# Patient Record
Sex: Female | Born: 1970 | Race: Black or African American | Hispanic: No | Marital: Single | Smoking: Never smoker
Health system: Southern US, Community
[De-identification: ages and names within clinical notes are randomized; demographics above are authoritative.]

## PROBLEM LIST (undated history)

## (undated) DIAGNOSIS — I509 Heart failure, unspecified: Secondary | ICD-10-CM

## (undated) DIAGNOSIS — I10 Essential (primary) hypertension: Secondary | ICD-10-CM

## (undated) HISTORY — PX: CARPAL TUNNEL RELEASE: SHX101

---

## 1999-06-26 ENCOUNTER — Emergency Department (HOSPITAL_COMMUNITY): Admission: EM | Admit: 1999-06-26 | Discharge: 1999-06-27 | Payer: Self-pay | Admitting: Emergency Medicine

## 2000-09-13 ENCOUNTER — Ambulatory Visit (HOSPITAL_BASED_OUTPATIENT_CLINIC_OR_DEPARTMENT_OTHER): Admission: RE | Admit: 2000-09-13 | Discharge: 2000-09-13 | Payer: Self-pay | Admitting: Orthopedic Surgery

## 2001-01-26 ENCOUNTER — Encounter: Admission: RE | Admit: 2001-01-26 | Discharge: 2001-04-26 | Payer: Self-pay | Admitting: Obstetrics & Gynecology

## 2001-09-02 ENCOUNTER — Emergency Department (HOSPITAL_COMMUNITY): Admission: EM | Admit: 2001-09-02 | Discharge: 2001-09-02 | Payer: Self-pay

## 2001-09-13 ENCOUNTER — Encounter: Payer: Self-pay | Admitting: Urology

## 2001-09-13 ENCOUNTER — Ambulatory Visit (HOSPITAL_BASED_OUTPATIENT_CLINIC_OR_DEPARTMENT_OTHER): Admission: RE | Admit: 2001-09-13 | Discharge: 2001-09-13 | Payer: Self-pay | Admitting: Urology

## 2004-06-23 ENCOUNTER — Emergency Department (HOSPITAL_COMMUNITY): Admission: EM | Admit: 2004-06-23 | Discharge: 2004-06-23 | Payer: Self-pay | Admitting: Emergency Medicine

## 2005-02-08 ENCOUNTER — Emergency Department (HOSPITAL_COMMUNITY): Admission: EM | Admit: 2005-02-08 | Discharge: 2005-02-08 | Payer: Self-pay | Admitting: Emergency Medicine

## 2007-06-23 ENCOUNTER — Emergency Department (HOSPITAL_COMMUNITY): Admission: EM | Admit: 2007-06-23 | Discharge: 2007-06-23 | Payer: Self-pay | Admitting: *Deleted

## 2007-06-29 ENCOUNTER — Emergency Department (HOSPITAL_COMMUNITY): Admission: EM | Admit: 2007-06-29 | Discharge: 2007-06-29 | Payer: Self-pay | Admitting: Emergency Medicine

## 2007-08-31 ENCOUNTER — Emergency Department (HOSPITAL_COMMUNITY): Admission: EM | Admit: 2007-08-31 | Discharge: 2007-08-31 | Payer: Self-pay | Admitting: Emergency Medicine

## 2007-10-31 ENCOUNTER — Emergency Department (HOSPITAL_COMMUNITY): Admission: EM | Admit: 2007-10-31 | Discharge: 2007-11-01 | Payer: Self-pay | Admitting: Emergency Medicine

## 2008-12-23 ENCOUNTER — Emergency Department (HOSPITAL_COMMUNITY): Admission: EM | Admit: 2008-12-23 | Discharge: 2008-12-23 | Payer: Self-pay | Admitting: Family Medicine

## 2009-01-12 ENCOUNTER — Emergency Department (HOSPITAL_COMMUNITY): Admission: EM | Admit: 2009-01-12 | Discharge: 2009-01-12 | Payer: Self-pay | Admitting: Emergency Medicine

## 2009-04-10 ENCOUNTER — Emergency Department (HOSPITAL_COMMUNITY): Admission: EM | Admit: 2009-04-10 | Discharge: 2009-04-10 | Payer: Self-pay | Admitting: Emergency Medicine

## 2010-09-12 LAB — RAPID STREP SCREEN (MED CTR MEBANE ONLY): Streptococcus, Group A Screen (Direct): NEGATIVE

## 2010-10-22 NOTE — Op Note (Signed)
[facility]  Patient:    Jasmine Novak, Jasmine Novak                          MRN: [id number] Proc. Date: 09/13/00 Attending:  Artist Pais. Mina Marble, M.D.                           Operative Report  PREOPERATIVE DIAGNOSIS:  Left carpal tunnel syndrome.  POSTOPERATIVE DIAGNOSIS:  Left carpal tunnel syndrome.  PROCEDURE:  Left carpal tunnel release.  SURGEON:  Artist Pais. Mina Marble, M.D.  ASSISTANT:  Junius Roads. Ireton, P.A.C.  ANESTHESIA:  General anesthesia.  TOURNIQUET TIME:  15 minutes.  COMPLICATIONS:  None.  DRAINS:  None.  DESCRIPTION OF PROCEDURE:  The patient was taken to the operating room where, after induction of adequate general anesthesia, the left upper extremity was prepped and draped in the usual sterile fashion.  Esmarch was used to exanguinate the limb.  Tourniquet was then inflated to 250 mmHg.  At this point in time, a 2 cm incision was made in the palmar aspect of the left hand in line with the radial border of the ring finger parallel to the thenar crease.  The incision was taken down through the skin and subcutaneous tissues until the palmar fascia was identified.  The palmar fascia was split longitudinally, thus exposing the transverse carpal ligament.  The transverse carpal ligament was divided with a 15 blade. This exposed an underlying median nerve and contents of the carpal canal.  At this point in time, using Ragnell retractors, the distal and proximal extents of the transverse carpal ligament were divided under direct vision thus decompressing the median nerve.  The wound was explored and no osseus lesions or ganglions present.  It was then irrigated and closed with a running 3-0 Prolene subcuticular stitch. Steri-Strips, 4 x 4s, fluffs, and compressive dressing was applied.  The patient also had 0.25% plain Marcaine injected postoperatively for pain control.  The patient tolerated the procedure well and went to the recovery room  in stable condition. DD:  09/13/00 TD:  09/13/00 Job: 20 [id number]

## 2011-02-25 LAB — RAPID STREP SCREEN (MED CTR MEBANE ONLY): Streptococcus, Group A Screen (Direct): POSITIVE — AB

## 2011-04-11 ENCOUNTER — Encounter (HOSPITAL_COMMUNITY): Payer: Self-pay

## 2011-04-11 ENCOUNTER — Inpatient Hospital Stay (HOSPITAL_COMMUNITY): Payer: Self-pay

## 2011-04-11 ENCOUNTER — Inpatient Hospital Stay (HOSPITAL_COMMUNITY)
Admission: AD | Admit: 2011-04-11 | Discharge: 2011-04-11 | Disposition: A | Discharge status: Home / Self Care | Payer: Self-pay | Source: Ambulatory Visit | Attending: Obstetrics & Gynecology | Admitting: Obstetrics & Gynecology

## 2011-04-11 DIAGNOSIS — N390 Urinary tract infection, site not specified: Secondary | ICD-10-CM | POA: Insufficient documentation

## 2011-04-11 DIAGNOSIS — D259 Leiomyoma of uterus, unspecified: Secondary | ICD-10-CM | POA: Insufficient documentation

## 2011-04-11 DIAGNOSIS — R1011 Right upper quadrant pain: Secondary | ICD-10-CM | POA: Insufficient documentation

## 2011-04-11 DIAGNOSIS — A5901 Trichomonal vulvovaginitis: Secondary | ICD-10-CM | POA: Insufficient documentation

## 2011-04-11 DIAGNOSIS — T148XXA Other injury of unspecified body region, initial encounter: Secondary | ICD-10-CM

## 2011-04-11 DIAGNOSIS — A599 Trichomoniasis, unspecified: Secondary | ICD-10-CM

## 2011-04-11 LAB — URINALYSIS, ROUTINE W REFLEX MICROSCOPIC
Bilirubin Urine: NEGATIVE
Glucose, UA: NEGATIVE mg/dL
Ketones, ur: NEGATIVE mg/dL
Nitrite: NEGATIVE
Protein, ur: NEGATIVE mg/dL
Specific Gravity, Urine: >1.03 — ABNORMAL HIGH (ref 1.005–1.030)
Urobilinogen, UA: 0.2 mg/dL (ref 0.0–1.0)
pH: 5.5 (ref 5.0–8.0)

## 2011-04-11 LAB — URINE MICROSCOPIC-ADD ON

## 2011-04-11 LAB — COMPREHENSIVE METABOLIC PANEL
ALT: 14 U/L (ref 0–35)
AST: 13 U/L (ref 0–37)
Albumin: 3.7 g/dL (ref 3.5–5.2)
Alkaline Phosphatase: 101 U/L (ref 39–117)
BUN: 14 mg/dL (ref 6–23)
CO2: 24 mEq/L (ref 19–32)
Calcium: 9.4 mg/dL (ref 8.4–10.5)
Chloride: 99 mEq/L (ref 96–112)
Creatinine, Ser: 0.95 mg/dL (ref 0.50–1.10)
GFR calc Af Amer: 85 mL/min — ABNORMAL LOW (ref >90)
GFR calc non Af Amer: 73 mL/min — ABNORMAL LOW (ref >90)
Glucose, Bld: 112 mg/dL — ABNORMAL HIGH (ref 70–99)
Potassium: 3.7 mEq/L (ref 3.5–5.1)
Sodium: 131 mEq/L — ABNORMAL LOW (ref 135–145)
Total Bilirubin: 0.2 mg/dL — ABNORMAL LOW (ref 0.3–1.2)
Total Protein: 8.4 g/dL — ABNORMAL HIGH (ref 6.0–8.3)

## 2011-04-11 LAB — DIFFERENTIAL
Basophils Absolute: 0 10*3/uL (ref 0.0–0.1)
Basophils Relative: 0 % (ref 0–1)
Eosinophils Absolute: 0.1 10*3/uL (ref 0.0–0.7)
Eosinophils Relative: 1 % (ref 0–5)
Lymphocytes Relative: 24 % (ref 12–46)
Lymphs Abs: 2.8 10*3/uL (ref 0.7–4.0)
Monocytes Absolute: 0.8 10*3/uL (ref 0.1–1.0)
Monocytes Relative: 7 % (ref 3–12)
Neutro Abs: 8.1 10*3/uL — ABNORMAL HIGH (ref 1.7–7.7)
Neutrophils Relative %: 68 % (ref 43–77)

## 2011-04-11 LAB — CBC
HCT: 35.3 % — ABNORMAL LOW (ref 36.0–46.0)
Hemoglobin: 11.2 g/dL — ABNORMAL LOW (ref 12.0–15.0)
MCH: 25 pg — ABNORMAL LOW (ref 26.0–34.0)
MCHC: 31.7 g/dL (ref 30.0–36.0)
MCV: 78.8 fL (ref 78.0–100.0)
Platelets: 369 10*3/uL (ref 150–400)
RBC: 4.48 MIL/uL (ref 3.87–5.11)
RDW: 15.2 % (ref 11.5–15.5)
WBC: 11.9 10*3/uL — ABNORMAL HIGH (ref 4.0–10.5)

## 2011-04-11 MED ORDER — METRONIDAZOLE 500 MG PO TABS: 500.0000 mg | ORAL_TABLET | Freq: Two times a day (BID) | ORAL | Status: AC

## 2011-04-11 MED ORDER — CIPROFLOXACIN HCL 500 MG PO TABS: 500.0000 mg | ORAL_TABLET | Freq: Two times a day (BID) | ORAL | Status: AC

## 2011-04-11 MED ORDER — IBUPROFEN 600 MG PO TABS: 600.0000 mg | ORAL_TABLET | Freq: Four times a day (QID) | ORAL | Status: AC | PRN

## 2011-04-11 NOTE — ED Provider Notes (Signed)
History     CSN: [id number] Arrival date & time: 04/11/2011 12:48 PM   None     Chief Complaint  Patient presents with  . Abdominal Pain   HPI Jasmine Novak is a 40 y.o. female who presents to [facility] for right upper quadrant abdominal pain that has been off and on since yesterday. Today at work got real bad so decided to come in. Pain increases with sitting position that requires reaching. Pain goes away when stands. Denies vaginal bleeding but does have vaginal discharge. Periods have gotten heavier over the past 6 months and more painful. LMP 03/23/11 and lasted 7 days. Pap smears when there is a free clinic. Last pap over 2 years ago was normal.   Past Medical History  Diagnosis Date  . Herpes simplex     states hasn't had outbreak since she was "young"    Past Surgical History  Procedure Date  . Carpal tunnel release 1996 & 1999    bilateral wrist    Family History  Problem Relation Age of Onset  . Stroke Father   . Heart disease Daughter   . Diabetes Maternal Grandmother     History  Substance Use Topics  . Smoking status: Never Smoker   . Smokeless tobacco: Not on file  . Alcohol Use: Yes     wine once a month    OB History    Grav Para Term Preterm Abortions TAB SAB Ect Mult Living   2 2 2  0 0 0 0 0 0 2      Review of Systems  Constitutional: Negative for fever, chills, diaphoresis and fatigue.  HENT: Negative for ear pain, congestion, sore throat, facial swelling, neck pain, neck stiffness, dental problem and sinus pressure.   Eyes: Negative for photophobia, pain and discharge.  Respiratory: Negative for cough, chest tightness and wheezing.   Cardiovascular: Negative.   Gastrointestinal: Positive for abdominal pain. Negative for nausea, vomiting, diarrhea, constipation and abdominal distention.  Genitourinary: Positive for urgency, frequency and vaginal discharge. Negative for dysuria, flank pain, vaginal bleeding and difficulty urinating.  Musculoskeletal:  Negative for myalgias, back pain and gait problem.  Skin: Negative for color change and rash.  Neurological: Negative for dizziness, speech difficulty, weakness, light-headedness, numbness and headaches.  Psychiatric/Behavioral: Negative for confusion and agitation. The patient is not nervous/anxious.     Allergies  Review of patient's allergies indicates not on file.  Home Medications  No current outpatient prescriptions on file.  BP 148/77  Pulse 101  Temp(Src) 99.1 F (37.3 C) (Oral)  Resp 20  Ht 5\' 6"  (1.676 m)  Wt 302 lb 12.8 oz (137.349 kg)  BMI 48.87 kg/m2  SpO2 97%  LMP 03/23/2011  Physical Exam  Nursing note and vitals reviewed. Constitutional: She is oriented to person, place, and time. She appears well-developed and well-nourished.  HENT:  Head: Normocephalic.  Eyes: EOM are normal.  Neck: Neck supple.  Pulmonary/Chest: Effort normal.  Abdominal: Soft. There is tenderness in the right upper quadrant. There is no rebound and no guarding.       Tenderness is minimal on right side and increases with movement and deep breath.  Genitourinary:        No CVA tenderness. Scant blood vaginal vault. Frothy discharge, malodorous, Cervix with erythema. Uterus slightly enlarged.  Musculoskeletal: Normal range of motion.  Neurological: She is alert and oriented to person, place, and time. No cranial nerve deficit.  Skin: Skin is warm and dry.  Psychiatric:  She has a normal mood and affect. Her behavior is normal. Judgment and thought content normal.   Results for orders placed during the hospital encounter of 04/11/11 (from the past 24 hour(s))  URINALYSIS, ROUTINE W REFLEX MICROSCOPIC     Status: Abnormal   Collection Time   04/11/11 12:31 PM      Component Value Range   Color, Urine YELLOW  YELLOW    Appearance CLEAR  CLEAR    Specific Gravity, Urine >1.030 (*) 1.005 - 1.030    pH 5.5  5.0 - 8.0    Glucose, UA NEGATIVE  NEGATIVE (mg/dL)   Hgb urine dipstick LARGE (*)  NEGATIVE    Bilirubin Urine NEGATIVE  NEGATIVE    Ketones, ur NEGATIVE  NEGATIVE (mg/dL)   Protein, ur NEGATIVE  NEGATIVE (mg/dL)   Urobilinogen, UA 0.2  0.0 - 1.0 (mg/dL)   Nitrite NEGATIVE  NEGATIVE    Leukocytes, UA SMALL (*) NEGATIVE   URINE MICROSCOPIC-ADD ON     Status: Abnormal   Collection Time   04/11/11 12:31 PM      Component Value Range   Squamous Epithelial / LPF MANY (*) RARE    WBC, UA 7-10  <3 (WBC/hpf)   RBC / HPF 11-20  <3 (RBC/hpf)   Bacteria, UA RARE  RARE   CBC     Status: Abnormal   Collection Time   04/11/11  1:35 PM      Component Value Range   WBC 11.9 (*) 4.0 - 10.5 (K/uL)   RBC 4.48  3.87 - 5.11 (MIL/uL)   Hemoglobin 11.2 (*) 12.0 - 15.0 (g/dL)   HCT 16.1 (*) 09.6 - 46.0 (%)   MCV 78.8  78.0 - 100.0 (fL)   MCH 25.0 (*) 26.0 - 34.0 (pg)   MCHC 31.7  30.0 - 36.0 (g/dL)   RDW 04.5  40.9 - 81.1 (%)   Platelets 369  150 - 400 (K/uL)  DIFFERENTIAL     Status: Abnormal   Collection Time   04/11/11  1:35 PM      Component Value Range   Neutrophils Relative 68  43 - 77 (%)   Neutro Abs 8.1 (*) 1.7 - 7.7 (K/uL)   Lymphocytes Relative 24  12 - 46 (%)   Lymphs Abs 2.8  0.7 - 4.0 (K/uL)   Monocytes Relative 7  3 - 12 (%)   Monocytes Absolute 0.8  0.1 - 1.0 (K/uL)   Eosinophils Relative 1  0 - 5 (%)   Eosinophils Absolute 0.1  0.0 - 0.7 (K/uL)   Basophils Relative 0  0 - 1 (%)   Basophils Absolute 0.0  0.0 - 0.1 (K/uL)  COMPREHENSIVE METABOLIC PANEL     Status: Abnormal   Collection Time   04/11/11  1:35 PM      Component Value Range   Sodium 131 (*) 135 - 145 (mEq/L)   Potassium 3.7  3.5 - 5.1 (mEq/L)   Chloride 99  96 - 112 (mEq/L)   CO2 24  19 - 32 (mEq/L)   Glucose, Bld 112 (*) 70 - 99 (mg/dL)   BUN 14  6 - 23 (mg/dL)   Creatinine, Ser 9.14  0.50 - 1.10 (mg/dL)   Calcium 9.4  8.4 - 78.2 (mg/dL)   Total Protein 8.4 (*) 6.0 - 8.3 (g/dL)   Albumin 3.7  3.5 - 5.2 (g/dL)   AST 13  0 - 37 (U/L)   ALT 14  0 - 35 (U/L)  Alkaline Phosphatase 101  39 -  117 (U/L)   Total Bilirubin 0.2 (*) 0.3 - 1.2 (mg/dL)   GFR calc non Af Amer 73 (*) >90 (mL/min)   GFR calc Af Amer 85 (*) >90 (mL/min)  WET PREP, GENITAL     Status: Abnormal   Collection Time   04/11/11  2:14 PM      Component Value Range   Yeast, Wet Prep NONE SEEN  NONE SEEN    Trich, Wet Prep FEW (*) NONE SEEN    Clue Cells, Wet Prep NONE SEEN  NONE SEEN    WBC, Wet Prep HPF POC MODERATE (*) NONE SEEN    US Transvaginal Non-ob  04/11/2011  *RADIOLOGY REPORT*  Clinical Data: Pelvic pain.  Heavy bleeding with more painful periods for 6 months.  TRANSABDOMINAL AND TRANSVAGINAL ULTRASOUND OF PELVIS Technique:  Both transabdominal and transvaginal ultrasound examinations of the pelvis were performed. Transabdominal technique was performed for global imaging of the pelvis including uterus, ovaries, adnexal regions, and pelvic cul-de-sac.  Comparison: None available   It was necessary to proceed with endovaginal exam following the transabdominal exam to visualize the ovaries and endometrium.  Findings:  Uterus: The uterus is 10.0 x 5.1 x 5.9 cm.  Small anterior lateral fibroid is 1.2 x 1.0 x 1.1 cm.  Endometrium: The endometrium appears normal, 11.8 mm in thickness.  Right ovary:  The right ovary appears normal, 2.8 x 1.6 x 1.9 cm.  Left ovary: The left ovary appears normal, 3.3 x 2.4 x 1.8 cm.  Other findings: No free fluid  IMPRESSION:  1.  Small anterior uterine fibroid. 2.  Normal-appearing ovaries.  .  Original Report Authenticated By: Patterson Hammersmith, M.D.   US Pelvis Complete  04/11/2011  *RADIOLOGY REPORT*  Clinical Data: Pelvic pain.  Heavy bleeding with more painful periods for 6 months.  TRANSABDOMINAL AND TRANSVAGINAL ULTRASOUND OF PELVIS Technique:  Both transabdominal and transvaginal ultrasound examinations of the pelvis were performed. Transabdominal technique was performed for global imaging of the pelvis including uterus, ovaries, adnexal regions, and pelvic cul-de-sac.   Comparison: None available   It was necessary to proceed with endovaginal exam following the transabdominal exam to visualize the ovaries and endometrium.  Findings:  Uterus: The uterus is 10.0 x 5.1 x 5.9 cm.  Small anterior lateral fibroid is 1.2 x 1.0 x 1.1 cm.  Endometrium: The endometrium appears normal, 11.8 mm in thickness.  Right ovary:  The right ovary appears normal, 2.8 x 1.6 x 1.9 cm.  Left ovary: The left ovary appears normal, 3.3 x 2.4 x 1.8 cm.  Other findings: No free fluid  IMPRESSION:  1.  Small anterior uterine fibroid. 2.  Normal-appearing ovaries.  .  Original Report Authenticated By: Patterson Hammersmith, M.D.   Assessment: Trichomonas   UTI   Uterine fibroid   Muscle strain  Plan:  Flagyl 500 mg. Po bid x 7 day   Cipro 500 mg. Po bid x 5 days   Ibuprofen 600 mg. Po q 6 hours   Follow up with GYN Clinic  ED Course  Procedures   MDM          [facility], NP 04/11/11 [id number]

## 2011-04-11 NOTE — Progress Notes (Signed)
Pt states upper right quadrant pain that started Saturday. Intermittent sharp pain that has increased in frequency today. States hasn't taken in medication for the pain.

## 2011-04-12 NOTE — ED Provider Notes (Signed)
Attestation of Attending Supervision of Advanced Practitioner: Evaluation and management procedures were performed by the PA/NP/CNM/OB Fellow under my supervision/collaboration. Chart reviewed, and agree with management and plan.  Jaynie Collins A M.D. 04/12/2011 8:51 AM

## 2011-04-13 LAB — GC/CHLAMYDIA PROBE AMP, GENITAL
Chlamydia, DNA Probe: POSITIVE — AB
GC Probe Amp, Genital: NEGATIVE

## 2012-08-07 ENCOUNTER — Emergency Department (HOSPITAL_COMMUNITY)
Admission: EM | Admit: 2012-08-07 | Discharge: 2012-08-07 | Disposition: A | Discharge status: Home / Self Care | Payer: Self-pay | Attending: Emergency Medicine | Admitting: Emergency Medicine

## 2012-08-07 ENCOUNTER — Encounter (HOSPITAL_COMMUNITY): Payer: Self-pay | Admitting: Emergency Medicine

## 2012-08-07 DIAGNOSIS — Z8619 Personal history of other infectious and parasitic diseases: Secondary | ICD-10-CM | POA: Insufficient documentation

## 2012-08-07 DIAGNOSIS — M79609 Pain in unspecified limb: Secondary | ICD-10-CM | POA: Insufficient documentation

## 2012-08-07 DIAGNOSIS — M79601 Pain in right arm: Secondary | ICD-10-CM

## 2012-08-07 DIAGNOSIS — M549 Dorsalgia, unspecified: Secondary | ICD-10-CM | POA: Insufficient documentation

## 2012-08-07 MED ORDER — IBUPROFEN 100 MG/5ML PO SUSP
400.0000 mg | Freq: Once | ORAL | Status: AC
  Administered 2012-08-07: 400 mg via ORAL
  Filled 2012-08-07: qty 20

## 2012-08-07 NOTE — ED Notes (Signed)
Patient with right upper forearm pain.  Patient denies any injury and reports pain is worse with movement.  Slight amount of swelling present.   Normal color, and circulation intact.

## 2012-08-07 NOTE — ED Provider Notes (Signed)
Medical screening examination/treatment/procedure(s) were performed by non-physician practitioner and as supervising physician I was immediately available for consultation/collaboration.   Tamesha Ellerbrock L Chenay Nesmith, MD 08/07/12 2353 

## 2012-08-07 NOTE — ED Provider Notes (Signed)
History  This chart was scribed for non-physician practitioner working with Flint Melter, MD by Ardeen Jourdain, ED Scribe. This patient was seen in room WTR5/WTR5 and the patient's care was started at 2214.  CSN: [id number]  Arrival date & time 08/07/12  2137   First MD Initiated Contact with Patient 08/07/12 2214      Chief Complaint  Patient presents with  . Arm Pain     Patient is a 42 y.o. female presenting with arm pain. The history is provided by the patient. No language interpreter was used.  Arm Pain This is a new problem. The current episode started 1 to 4 weeks ago. The problem occurs intermittently. The problem has been unchanged. Pertinent negatives include no chest pain, no abdominal pain, no headaches and no shortness of breath. Exacerbated by: lifting, holding on to something for a long time. She has tried position changes for the symptoms. The treatment provided significant relief.  Arm Pain This is a new problem. The current episode started 1 to 4 weeks ago. The problem occurs intermittently. The problem has been unchanged. Pertinent negatives include no abdominal pain, chest pain, coughing, fatigue, fever, headaches, nausea, numbness, vomiting or weakness. Exacerbated by: lifting, holding on to something for a long time. She has tried position changes for the symptoms. The treatment provided significant relief.    Jasmine Novak is a 42 y.o. female who presents to the Emergency Department complaining of arm pain. She has been experiencing this pain for the past month, however it has worsened in the past couple days. There was no trauma. She works at a daycare for a living. She states she is having pain on her posterior lateral forearm. It is more of a tightness. She states it is mild. It does not radiate. She states she wanted to come get it checked out because she is concerned it is a blood clot. She has no history of blood clots, she is not sedentary, no trauma, no coag  disorders. She has not had any chest pain or shortness of breath.    Past Medical History  Diagnosis Date  . Herpes simplex     states hasn't had outbreak since she was "young"    Past Surgical History  Procedure Laterality Date  . Carpal tunnel release  1996 & 1999    bilateral wrist    Family History  Problem Relation Age of Onset  . Stroke Father   . Heart disease Daughter   . Diabetes Maternal Grandmother     History  Substance Use Topics  . Smoking status: Never Smoker   . Smokeless tobacco: Not on file  . Alcohol Use: Yes     Comment: wine once a month    OB History   Grav Para Term Preterm Abortions TAB SAB Ect Mult Living   2 2 2  0 0 0 0 0 0 2      Review of Systems  Constitutional: Negative for fever and fatigue.  Respiratory: Negative for cough and shortness of breath.   Cardiovascular: Negative for chest pain.  Gastrointestinal: Negative for nausea, vomiting, abdominal pain and diarrhea.       Recent increase in gas (belching and flatus)  Musculoskeletal: Positive for back pain.       Right upper forearm pain  Neurological: Negative for weakness, numbness and headaches.  All other systems reviewed and are negative.    Allergies  Review of patient's allergies indicates no known allergies.  Home Medications  Current Outpatient Rx  Name  Route  Sig  Dispense  Refill  . calcium carbonate (TUMS - DOSED IN MG ELEMENTAL CALCIUM) 500 MG chewable tablet   Oral   Chew 2 tablets by mouth daily as needed for heartburn. For gas           Triage Vitals: BP 174/76  Pulse 88  Temp(Src) 98.6 F (37 C) (Oral)  Resp 20  Wt 309 lb (140.161 kg)  BMI 49.9 kg/m2  SpO2 98%  LMP 07/17/2012  Physical Exam  Nursing note and vitals reviewed. Constitutional: She is oriented to person, place, and time. She appears well-developed and well-nourished. No distress.  HENT:  Head: Normocephalic and atraumatic.  Right Ear: External ear normal.  Left Ear:  External ear normal.  Nose: Nose normal.  Mouth/Throat: Oropharynx is clear and moist.  Eyes: Conjunctivae and EOM are normal. Pupils are equal, round, and reactive to light.  Neck: Normal range of motion. Neck supple. No tracheal deviation present.  Cardiovascular: Normal rate, regular rhythm, normal heart sounds, intact distal pulses and normal pulses.  Exam reveals no gallop and no friction rub.   No murmur heard. Pulmonary/Chest: Effort normal and breath sounds normal. No respiratory distress. She has no wheezes. She has no rales.  Abdominal: Soft. She exhibits no distension.  Musculoskeletal: Normal range of motion. She exhibits no edema and no tenderness.  No swelling, erythema, streaking No tenderness on palpation of posterio-lateral aspect of forearm Patient states positive for pain when extension was resisted in the forearm   Neurological: She is alert and oriented to person, place, and time. She has normal strength. No sensory deficit.  Skin: Skin is warm and dry.  Psychiatric: She has a normal mood and affect. Her behavior is normal.    ED Course  Procedures (including critical care time)  DIAGNOSTIC STUDIES: Oxygen Saturation is 98% on room air, normal by my interpretation.    COORDINATION OF CARE:  10:19 PMDiscussed treatment plan which includes follow up with PCP and rest and ice with pt at bedside and pt agreed to plan.     Labs Reviewed - No data to display No results found.   1. Arm pain, right       MDM  Vitals stable. Patient is not in pain. Worse with movement. Pain only reproducible when the extensors were strained. Pain does not radiate. Patient instructed to put rest and ice her arm. Muscle strain likely. Given motrin suspension because she cannot tolerate pills. Given return precautions. Follow up with PCP.     I personally performed the services described in this documentation, which was scribed in my presence. The recorded information has been  reviewed and is accurate.  Mora Bellman, PA-C 08/07/12 2350

## 2014-04-07 ENCOUNTER — Encounter (HOSPITAL_COMMUNITY): Payer: Self-pay | Admitting: Emergency Medicine

## 2015-01-07 ENCOUNTER — Encounter (HOSPITAL_COMMUNITY): Payer: Self-pay | Admitting: Emergency Medicine

## 2015-01-07 ENCOUNTER — Emergency Department (HOSPITAL_COMMUNITY): Payer: No Typology Code available for payment source

## 2015-01-07 ENCOUNTER — Emergency Department (HOSPITAL_COMMUNITY)
Admission: EM | Admit: 2015-01-07 | Discharge: 2015-01-07 | Disposition: A | Discharge status: Home / Self Care | Payer: No Typology Code available for payment source | Attending: Emergency Medicine | Admitting: Emergency Medicine

## 2015-01-07 DIAGNOSIS — S299XXA Unspecified injury of thorax, initial encounter: Secondary | ICD-10-CM | POA: Insufficient documentation

## 2015-01-07 DIAGNOSIS — R0789 Other chest pain: Secondary | ICD-10-CM

## 2015-01-07 DIAGNOSIS — Y92410 Unspecified street and highway as the place of occurrence of the external cause: Secondary | ICD-10-CM | POA: Diagnosis not present

## 2015-01-07 DIAGNOSIS — Z8619 Personal history of other infectious and parasitic diseases: Secondary | ICD-10-CM | POA: Insufficient documentation

## 2015-01-07 DIAGNOSIS — Y998 Other external cause status: Secondary | ICD-10-CM | POA: Insufficient documentation

## 2015-01-07 DIAGNOSIS — Y9389 Activity, other specified: Secondary | ICD-10-CM | POA: Diagnosis not present

## 2015-01-07 MED ORDER — IBUPROFEN 800 MG PO TABS: 800.0000 mg | ORAL_TABLET | Freq: Three times a day (TID) | ORAL | Status: DC

## 2015-01-07 MED ORDER — CYCLOBENZAPRINE HCL 10 MG PO TABS: 10.0000 mg | ORAL_TABLET | Freq: Two times a day (BID) | ORAL | Status: DC | PRN
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
## 2015-01-07 NOTE — ED Notes (Signed)
Notified PA of patient condition, asked if we would need an EKG and chest pain workup. PA stated she would examine patient and make determination d/t pt complaint being primarily musculoskeletal in nature

## 2015-11-12 ENCOUNTER — Inpatient Hospital Stay (HOSPITAL_COMMUNITY)
Admission: EM | Admit: 2015-11-12 | Discharge: 2015-11-15 | DRG: 292 | Disposition: A | Discharge status: Home / Self Care | Payer: Self-pay | Attending: Internal Medicine | Admitting: Internal Medicine

## 2015-11-12 ENCOUNTER — Encounter (HOSPITAL_COMMUNITY): Payer: Self-pay | Admitting: Emergency Medicine

## 2015-11-12 ENCOUNTER — Emergency Department (HOSPITAL_COMMUNITY): Payer: Self-pay

## 2015-11-12 DIAGNOSIS — I509 Heart failure, unspecified: Secondary | ICD-10-CM

## 2015-11-12 DIAGNOSIS — E662 Morbid (severe) obesity with alveolar hypoventilation: Secondary | ICD-10-CM | POA: Diagnosis present

## 2015-11-12 DIAGNOSIS — I110 Hypertensive heart disease with heart failure: Principal | ICD-10-CM | POA: Diagnosis present

## 2015-11-12 DIAGNOSIS — I160 Hypertensive urgency: Secondary | ICD-10-CM

## 2015-11-12 DIAGNOSIS — J209 Acute bronchitis, unspecified: Secondary | ICD-10-CM | POA: Diagnosis present

## 2015-11-12 DIAGNOSIS — R0982 Postnasal drip: Secondary | ICD-10-CM | POA: Diagnosis present

## 2015-11-12 DIAGNOSIS — Z6841 Body Mass Index (BMI) 40.0 and over, adult: Secondary | ICD-10-CM

## 2015-11-12 DIAGNOSIS — I5031 Acute diastolic (congestive) heart failure: Secondary | ICD-10-CM | POA: Diagnosis present

## 2015-11-12 MED ORDER — ALBUTEROL SULFATE (2.5 MG/3ML) 0.083% IN NEBU
5.0000 mg | INHALATION_SOLUTION | Freq: Once | RESPIRATORY_TRACT | Status: AC
  Administered 2015-11-12: 5 mg via RESPIRATORY_TRACT
  Filled 2015-11-12: qty 6

## 2015-11-12 NOTE — ED Notes (Addendum)
Pt c/o cough x 2 weeks with increased SOB today. Pt sts she feels SOB at rest. NAD noted, pt speaking in complete sentences. A&Ox4 and ambulatory. Pt coughing up clear sputum except for in the morning when it appears brown. Denies chest pain, lightheadedness, dizziness, n/v/d.

## 2015-11-13 DIAGNOSIS — J00 Acute nasopharyngitis [common cold]: Secondary | ICD-10-CM

## 2015-11-13 DIAGNOSIS — R0982 Postnasal drip: Secondary | ICD-10-CM

## 2015-11-13 DIAGNOSIS — I10 Essential (primary) hypertension: Secondary | ICD-10-CM

## 2015-11-13 DIAGNOSIS — I509 Heart failure, unspecified: Secondary | ICD-10-CM

## 2015-11-13 LAB — POTASSIUM
POTASSIUM: 3.8 mmol/L (ref 3.5–5.1)
Potassium: 3.7 mmol/L (ref 3.5–5.1)

## 2015-11-13 LAB — CBC WITH DIFFERENTIAL/PLATELET
BASOS ABS: 0 10*3/uL (ref 0.0–0.1)
Basophils Relative: 0 %
EOS ABS: 0.4 10*3/uL (ref 0.0–0.7)
Eosinophils Relative: 3 %
HEMATOCRIT: 35.4 % — AB (ref 36.0–46.0)
HEMOGLOBIN: 11.3 g/dL — AB (ref 12.0–15.0)
Lymphocytes Relative: 29 %
Lymphs Abs: 3.5 10*3/uL (ref 0.7–4.0)
MCH: 23.9 pg — ABNORMAL LOW (ref 26.0–34.0)
MCHC: 31.9 g/dL (ref 30.0–36.0)
MCV: 74.8 fL — ABNORMAL LOW (ref 78.0–100.0)
MONOS PCT: 7 %
Monocytes Absolute: 0.9 10*3/uL (ref 0.1–1.0)
NEUTROS ABS: 7.2 10*3/uL (ref 1.7–7.7)
NEUTROS PCT: 61 %
Platelets: 385 10*3/uL (ref 150–400)
RBC: 4.73 MIL/uL (ref 3.87–5.11)
RDW: 16.1 % — ABNORMAL HIGH (ref 11.5–15.5)
WBC: 11.9 10*3/uL — ABNORMAL HIGH (ref 4.0–10.5)

## 2015-11-13 LAB — LIPID PANEL
Cholesterol: 174 mg/dL (ref 0–200)
HDL: 36 mg/dL — ABNORMAL LOW (ref >40)
LDL Cholesterol: 106 mg/dL — ABNORMAL HIGH (ref 0–99)
Total CHOL/HDL Ratio: 4.8 RATIO
Triglycerides: 158 mg/dL — ABNORMAL HIGH (ref <150)
VLDL: 32 mg/dL (ref 0–40)

## 2015-11-13 LAB — BRAIN NATRIURETIC PEPTIDE: B NATRIURETIC PEPTIDE 5: 52.2 pg/mL (ref 0.0–100.0)

## 2015-11-13 LAB — I-STAT CHEM 8, ED
BUN: 15 mg/dL (ref 6–20)
CALCIUM ION: 1.1 mmol/L — AB (ref 1.12–1.23)
CHLORIDE: 101 mmol/L (ref 101–111)
Creatinine, Ser: 1 mg/dL (ref 0.44–1.00)
Glucose, Bld: 106 mg/dL — ABNORMAL HIGH (ref 65–99)
HEMATOCRIT: 38 % (ref 36.0–46.0)
Hemoglobin: 12.9 g/dL (ref 12.0–15.0)
Potassium: 7.5 mmol/L (ref 3.5–5.1)
SODIUM: 136 mmol/L (ref 135–145)
TCO2: 32 mmol/L (ref 0–100)

## 2015-11-13 LAB — TROPONIN I

## 2015-11-13 MED ORDER — ENOXAPARIN SODIUM 80 MG/0.8ML ~~LOC~~ SOLN
0.5000 mg/kg | SUBCUTANEOUS | Status: DC
  Administered 2015-11-13 – 2015-11-15 (×3): 70 mg via SUBCUTANEOUS
  Filled 2015-11-13 (×3): qty 0.8

## 2015-11-13 MED ORDER — ENOXAPARIN SODIUM 40 MG/0.4ML ~~LOC~~ SOLN: 40.0000 mg | SUBCUTANEOUS | Status: DC

## 2015-11-13 MED ORDER — FUROSEMIDE 10 MG/ML IJ SOLN
40.0000 mg | Freq: Two times a day (BID) | INTRAMUSCULAR | Status: DC
  Administered 2015-11-13 – 2015-11-15 (×4): 40 mg via INTRAVENOUS
  Filled 2015-11-13 (×4): qty 4

## 2015-11-13 MED ORDER — LEVOFLOXACIN 750 MG PO TABS
750.0000 mg | ORAL_TABLET | Freq: Every day | ORAL | Status: DC
  Administered 2015-11-13 – 2015-11-15 (×3): 750 mg via ORAL
  Filled 2015-11-13 (×3): qty 1

## 2015-11-13 MED ORDER — FLUTICASONE PROPIONATE 50 MCG/ACT NA SUSP
2.0000 | Freq: Every day | NASAL | Status: DC
  Administered 2015-11-13 – 2015-11-15 (×3): 2 via NASAL
  Filled 2015-11-13: qty 16

## 2015-11-13 MED ORDER — NITROGLYCERIN 2 % TD OINT
1.0000 [in_us] | TOPICAL_OINTMENT | Freq: Once | TRANSDERMAL | Status: AC
  Administered 2015-11-13: 1 [in_us] via TOPICAL
  Filled 2015-11-13: qty 1

## 2015-11-13 MED ORDER — SODIUM CHLORIDE 0.9 % IV SOLN
Freq: Once | INTRAVENOUS | Status: AC
  Administered 2015-11-13: 02:00:00 via INTRAVENOUS

## 2015-11-13 MED ORDER — CARVEDILOL 3.125 MG PO TABS
3.1250 mg | ORAL_TABLET | Freq: Two times a day (BID) | ORAL | Status: DC
  Administered 2015-11-13 – 2015-11-15 (×4): 3.125 mg via ORAL
  Filled 2015-11-13 (×4): qty 1

## 2015-11-13 MED ORDER — LISINOPRIL 10 MG PO TABS
10.0000 mg | ORAL_TABLET | Freq: Every day | ORAL | Status: DC
  Administered 2015-11-13 – 2015-11-15 (×3): 10 mg via ORAL
  Filled 2015-11-13 (×3): qty 1

## 2015-11-13 MED ORDER — AMLODIPINE BESYLATE 10 MG PO TABS
10.0000 mg | ORAL_TABLET | Freq: Every day | ORAL | Status: DC
  Administered 2015-11-13 – 2015-11-15 (×3): 10 mg via ORAL
  Filled 2015-11-13 (×4): qty 1

## 2015-11-13 MED ORDER — MONTELUKAST SODIUM 10 MG PO TABS
10.0000 mg | ORAL_TABLET | Freq: Every day | ORAL | Status: DC
  Administered 2015-11-13 – 2015-11-14 (×2): 10 mg via ORAL
  Filled 2015-11-13 (×2): qty 1

## 2015-11-13 MED ORDER — FUROSEMIDE 10 MG/ML IJ SOLN
40.0000 mg | Freq: Once | INTRAMUSCULAR | Status: AC
  Administered 2015-11-13: 40 mg via INTRAVENOUS
  Filled 2015-11-13: qty 4

## 2015-11-13 MED ORDER — LORATADINE 10 MG PO TABS
10.0000 mg | ORAL_TABLET | Freq: Every day | ORAL | Status: DC
  Administered 2015-11-13 – 2015-11-15 (×3): 10 mg via ORAL
  Filled 2015-11-13 (×3): qty 1

## 2015-11-13 NOTE — H&P (Signed)
History and Physical  Jasmine Novak NO:[id number] DOB: 11-14-1970 DOA: 11/12/2015  Referring physician: ER PCP: No primary care provider on file.  Outpatient Specialists: None   Patient coming from: Home  Chief Complaint: SOB and productive cough  HPI: 45 year old [place] American female, with no PCP and has not followed up with Physician regularly. Patient presents with 2 week history of cough that is productive of yellowish brownish sputum, post nasal drip symptoms and SOB. Patient endorsed dyspnea on exertion and occasional PND with associated ankle swelling. On presentation to the hospital, the CXR revealed pulmonary edema. SBP was 265mmHg on presentation. No prior diagnosis of hypertension prior to presentation. Reports occasional headache, no neck pain, no fever or chills, no GI symptoms and no urinary symptoms.  ED Course: Given IV lasix and Nitro.  Pertinent labs: CXR revealed pulmonary edema. WBC is 11.9. EKG: Independently reviewed.  Imaging: independently reviewed.   Review of Systems: As in HPI. 12 systems were reviewed. Negative for fever, visual changes, rash, new muscle aches, chest pain, dysuria, bleeding, n/v/abdominal pain.  Past Medical History  Diagnosis Date  . Herpes simplex     states hasn't had outbreak since she was "young"    Past Surgical History  Procedure Laterality Date  . Carpal tunnel release  1996 & 1999    bilateral wrist     reports that she has never smoked. She does not have any smokeless tobacco history on file. She reports that she drinks alcohol. She reports that she does not use illicit drugs.  No Known Allergies  Family History  Problem Relation Age of Onset  . Stroke Father   . Heart disease Daughter   . Diabetes Maternal Grandmother      Prior to Admission medications   Medication Sig Start Date End Date Taking? Authorizing Provider  aspirin-acetaminophen-caffeine (EXCEDRIN MIGRAINE) [phone] MG per tablet Take 1 tablet by  mouth every 6 (six) hours as needed for headache.   Yes Historical Provider, MD  Pseudoeph-Doxylamine-DM-APAP (NYQUIL PO) Take 30 mLs by mouth every 6 (six) hours as needed (cold symptoms).   Yes Historical Provider, MD    Physical Exam: Filed Vitals:   11/13/15 0533 11/13/15 0650 11/13/15 0657 11/13/15 0947  BP: 151/70  160/83 188/95  Pulse: 84  96 104  Temp:   99.2 F (37.3 C)   TempSrc:   Oral   Resp: 18  20   Height:  5\' 6"  (1.676 m)    Weight:  141.522 kg (312 lb)    SpO2: 100%  98%     Constitutional:  . Appears calm and comfortable. Obese Eyes:  . PERRL and irises appear normal . Normal conjunctivae and lids ENMT:  . external ears, nose appear normal Neck:  neck is supple. JVD is difficult to assess due to body habitus  Respiratory:  . Decreased air entry Cardiovascular:  S1S2. Abdomen:  . Abdomen is obese, soft and non tender. Organs are difficult to assess.  Musculoskeletal:  Leg edema Neurologic:  . Awake and alert. Moves all limbs.    Wt Readings from Last 3 Encounters:  11/13/15 141.522 kg (312 lb)  08/07/12 140.161 kg (309 lb)  04/11/11 137.349 kg (302 lb 12.8 oz)    I have personally reviewed following labs and imaging studies  Labs on Admission:  CBC:  Recent Labs Lab 11/13/15 0122 11/13/15 0131  WBC 11.9*  --   NEUTROABS 7.2  --   HGB 11.3* 12.9  HCT 35.4* 38.0  MCV 74.8*  --   PLT 385  --    Basic Metabolic Panel:  Recent Labs Lab 11/13/15 0122 11/13/15 0131 11/13/15 0215  NA  --  136  --   K 3.8 7.5* 3.7  CL  --  101  --   GLUCOSE  --  106*  --   BUN  --  15  --   CREATININE  --  1.00  --    Liver Function Tests: No results for input(s): AST, ALT, ALKPHOS, BILITOT, PROT, ALBUMIN in the last 168 hours. No results for input(s): LIPASE, AMYLASE in the last 168 hours. No results for input(s): AMMONIA in the last 168 hours. Coagulation Profile: No results for input(s): INR, PROTIME in the last 168 hours. Cardiac  Enzymes:  Recent Labs Lab 11/13/15 0122  TROPONINI <0.03   BNP (last 3 results) No results for input(s): PROBNP in the last 8760 hours. HbA1C: No results for input(s): HGBA1C in the last 72 hours. CBG: No results for input(s): GLUCAP in the last 168 hours. Lipid Profile: No results for input(s): CHOL, HDL, LDLCALC, TRIG, CHOLHDL, LDLDIRECT in the last 72 hours. Thyroid Function Tests: No results for input(s): TSH, T4TOTAL, FREET4, T3FREE, THYROIDAB in the last 72 hours. Anemia Panel: No results for input(s): VITAMINB12, FOLATE, FERRITIN, TIBC, IRON, RETICCTPCT in the last 72 hours. Urine analysis:    Component Value Date/Time   COLORURINE YELLOW 04/11/2011 [place] 04/11/2011 1231   LABSPEC >1.030* 04/11/2011 1231   PHURINE 5.5 04/11/2011 1231   GLUCOSEU NEGATIVE 04/11/2011 1231   HGBUR LARGE* 04/11/2011 [place] 04/11/2011 1231   KETONESUR NEGATIVE 04/11/2011 1231   PROTEINUR NEGATIVE 04/11/2011 1231   UROBILINOGEN 0.2 04/11/2011 1231   NITRITE NEGATIVE 04/11/2011 1231   LEUKOCYTESUR SMALL* 04/11/2011 1231   Sepsis Labs: @LABRCNTIP (procalcitonin:4,lacticidven:4) )No results found for this or any previous visit (from the past 240 hour(s)).    Radiological Exams on Admission: Dg Chest 2 View  11/12/2015  CLINICAL DATA:  Shortness of breath EXAM: CHEST  2 VIEW COMPARISON:  01/07/2015 FINDINGS: Cardiomegaly and vascular pedicle widening. Stable aortic tortuosity. Pulmonary venous congestion accentuated by low volumes. No interstitial edema or effusion. No focal pneumonia. No pneumothorax. IMPRESSION: Cardiomegaly and pulmonary venous congestion. Electronically Signed   By: Monte Fantasia M.D.   On: 11/12/2015 22:25    EKG: Independently reviewed.   Active Problems:   CHF (congestive heart failure) (HCC)   Assessment/Plan 1. Acute CHF, suspect diastolic versus combined systolic and diastolic 2. Accelerated hypertension 3. Post  nasal drip 4. Possible tracheobronchitis 5. Morbid obesity   Admit patient to telemetry floor  IV diuresis  Monitor renal function and electrolytes  Cautious control of blood pressure  Beta blocker  ACEI  Lipid profile  ECHO  Flonase, Singulair and Zyrtec  Levaquin  Cardiac diet  Needs PCP on discharge, heart failure education and compliance education  DVT prophylaxis:Lovenox Code Status: Full Family Communication: Mother, Father and Daughter Disposition Plan: Home eventually   Consults called: None. Low threshold to consult Caridology   Admission status: Inpatient    Time spent: 60 minutes  Dana Allan, MD  [facility] Pager #: [phone] 7PM-7AM contact night coverage as above  11/13/2015, 9:49 AM

## 2015-11-13 NOTE — ED Notes (Signed)
Provider verbalized apply 1 inch nitro bid wait 20 minutes reassess vitals and start lasix

## 2015-11-13 NOTE — Progress Notes (Signed)
CMT notified this RN that the patient had 5 beats of v tach. VSS. No c/o of CP. MD notified. Will continue to monitor.

## 2015-11-13 NOTE — ED Provider Notes (Signed)
CSN: [id number]     Arrival date & time 11/12/15  2028 History   First MD Initiated Contact with Patient 11/12/15 2357     Chief Complaint  Patient presents with  . 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 female presenting with cough. The history is provided by the patient.  Cough Cough characteristics:  Non-productive Severity:  Moderate Onset quality:  Gradual Duration:  2 weeks Timing:  Intermittent Progression:  Worsening Chronicity:  New Smoker: no   Relieved by:  Nothing Worsened by:  Activity Ineffective treatments:  None tried Associated symptoms: rhinorrhea and shortness of breath   Associated symptoms: no chest pain, no chills, no fever, no rash and no wheezing     Past Medical History  Diagnosis Date  . Herpes simplex     states hasn't had outbreak since she was "young"   Past Surgical History  Procedure Laterality Date  . Carpal tunnel release  1996 & 1999    bilateral wrist   Family History  Problem Relation Age of Onset  . Stroke Father   . Heart disease Daughter   . Diabetes Maternal Grandmother    Social History  Substance Use Topics  . Smoking status: Never Smoker   . Smokeless tobacco: None  . Alcohol Use: Yes     Comment: wine once a month   OB History    Gravida Para Term Preterm AB TAB SAB Ectopic Multiple Living   2 2 2  0 0 0 0 0 0 2     Review of Systems  Constitutional: Negative for fever and chills.  HENT: Positive for rhinorrhea.   Respiratory:  Positive for cough and shortness of breath. Negative for wheezing.   Cardiovascular: Positive for leg swelling. Negative for chest pain.  Skin: Negative for rash and wound.  All other systems reviewed and are negative.     Allergies  Review of patient's allergies indicates no known allergies.  Home Medications   Prior to Admission medications   Medication Sig Start Date End Date Taking? Authorizing Provider  amLODipine (NORVASC) 10 MG tablet Take 1 tablet (10 mg total) by mouth daily. 11/15/15   Bonnell Public, MD  carvedilol (COREG) 3.125 MG tablet Take 1 tablet (3.125 mg total) by mouth 2 (two) times daily with a meal. 11/15/15   Bonnell Public, MD  fluticasone (FLONASE) 50 MCG/ACT nasal spray Place 2 sprays into both nostrils daily. 11/15/15   Bonnell Public, MD  furosemide (LASIX) 40 MG tablet Take 1 tablet (40 mg total) by mouth daily. 11/15/15   Bonnell Public, MD  levofloxacin (LEVAQUIN) 750 MG tablet Take 1 tablet (750 mg total) by mouth daily. 11/15/15   Bonnell Public, MD  lisinopril (PRINIVIL,ZESTRIL) 10 MG tablet Take 1 tablet (10 mg total) by mouth daily. 11/15/15   Bonnell Public, MD  loratadine (CLARITIN) 10 MG tablet Take 1 tablet (10 mg total) by mouth daily. 11/15/15   Bonnell Public, MD  montelukast (SINGULAIR) 10 MG tablet Take 1 tablet (10 mg  total) by mouth at bedtime. 11/15/15   Bonnell Public, MD   BP 143/61 mmHg  Pulse 91  Temp(Src) 98 F (36.7 C) (Oral)  Resp 18  Ht 5\' 6"  (1.676 m)  Wt 138.03 kg  BMI 49.14 kg/m2  SpO2 99%  LMP 10/06/2015 Physical Exam  Constitutional: She is oriented to person, place, and time. She appears well-developed and well-nourished.  HENT:  Head: Normocephalic.  Eyes: Pupils are equal, round, and reactive to light.  Neck: Normal range of motion.  Cardiovascular: Regular rhythm.   Heart sounds muffled most likely due to patient body habitus  Musculoskeletal: She exhibits edema. She exhibits no  tenderness.  2+ pitting edema pretibial area  Neurological: She is alert and oriented to person, place, and time.  Skin: Skin is warm.  Nursing note and vitals reviewed.   ED Course  Procedures (including critical care time) Labs Review Labs Reviewed  CBC WITH DIFFERENTIAL/PLATELET - Abnormal; Notable for the following:    WBC 11.9 (*)    Hemoglobin 11.3 (*)    HCT 35.4 (*)    MCV 74.8 (*)    MCH 23.9 (*)    RDW 16.1 (*)    All other components within normal limits  LIPID PANEL - Abnormal; Notable for the following:    Triglycerides 158 (*)    HDL 36 (*)    LDL Cholesterol 106 (*)    All other components within normal limits  RENAL FUNCTION PANEL - Abnormal; Notable for the following:    Sodium 134 (*)    Glucose, Bld 104 (*)    Calcium 8.6 (*)    All other components within normal limits  RENAL FUNCTION PANEL - Abnormal; Notable for the following:    Calcium 8.5 (*)    All other components within normal limits  I-STAT CHEM 8, ED - Abnormal; Notable for the following:    Potassium 7.5 (*)    Glucose, Bld 106 (*)    Calcium, Ion 1.10 (*)    All other components within normal limits  POCT I-STAT, CHEM 8 - Abnormal; Notable for the following:    Potassium 7.5 (*)    Glucose, Bld 106 (*)    Calcium, Ion 1.10 (*)    All other components within normal limits  TROPONIN I  BRAIN NATRIURETIC PEPTIDE  POTASSIUM  POTASSIUM    Imaging Review No results found. I have personally reviewed and evaluated these images and lab results as part of my medical decision-making.   EKG Interpretation   Date/Time:  Thursday November 12 2015 21:53:30 EDT Ventricular Rate:  83 PR Interval:  160 QRS Duration: 81 QT Interval:  379 QTC Calculation: 445 R Axis:   -5 Text Interpretation:  Sinus rhythm Ventricular premature complex LVH by  voltage Anterior Q waves, possibly due to LVH poor R wave  progressionthrough precordial leads  new from previous Confirmed by LITTLE  MD, RACHEL [phone])  on 11/13/2015 1:02:31 AM Also confirmed by LITTLE MD,  [place] [phone]), editor [place] CT, Leda Gauze [phone])  on 11/13/2015 8:34:08 AM     H short search shows that the last several times she was evaluated in the emergency department for various reasons she has been hypertensive with a diastolic in the [id number] to [id number] on this had apparently never been addressed. I've asked that the patient be given an inch of nitroglycerin topically as well as Lasix and Norvasc Chest x-ray shows she has vascular congestion no infiltrate IV Lasix and topical nitroglycerin patient's  blood pressure is now 160/80 she is breathing easier she states she feels less tight in her lower extremities with less discomfort affected the patient has no primary care physician is eating me to want admission for first episode of mild CHF, hypertensive urgency MDM   Final diagnoses:  Hypertensive urgency  Acute congestive heart failure, unspecified congestive heart failure type ([place])         Junius Creamer, NP 11/18/15 2006  Sharlett Iles, MD 11/19/15 [phone]

## 2015-11-14 ENCOUNTER — Inpatient Hospital Stay (HOSPITAL_COMMUNITY): Payer: No Typology Code available for payment source

## 2015-11-14 ENCOUNTER — Other Ambulatory Visit (HOSPITAL_COMMUNITY): Payer: No Typology Code available for payment source

## 2015-11-14 DIAGNOSIS — I5031 Acute diastolic (congestive) heart failure: Secondary | ICD-10-CM

## 2015-11-14 DIAGNOSIS — I509 Heart failure, unspecified: Secondary | ICD-10-CM | POA: Diagnosis not present

## 2015-11-14 LAB — ECHOCARDIOGRAM COMPLETE
E decel time: 215 msec
E/e' ratio: 9.87
FS: 29 % (ref 28–44)
Height: 66 in
IVS/LV PW RATIO, ED: 1.22
LA ID, A-P, ES: 41 mm
LA diam end sys: 41 mm
LA diam index: 1.56 cm/m2
LA vol A4C: 39.5 ml
LA vol index: 15 mL/m2
LA vol: 39.4 mL
LV E/e' medial: 9.87
LV E/e'average: 9.87
LV PW d: 13.7 mm — AB (ref 0.6–1.1)
LV e' LATERAL: 6.74 cm/s
LVOT area: 4.15 cm2
LVOT diameter: 23 mm
MV Dec: 215
MV pk A vel: 65.5 m/s
MV pk E vel: 66.5 m/s
TDI e' lateral: 6.74
TDI e' medial: 4.13
Weight: 4929.6 oz

## 2015-11-14 LAB — RENAL FUNCTION PANEL
Albumin: 3.6 g/dL (ref 3.5–5.0)
Anion gap: 7 (ref 5–15)
BUN: 13 mg/dL (ref 6–20)
CO2: 26 mmol/L (ref 22–32)
Calcium: 8.6 mg/dL — ABNORMAL LOW (ref 8.9–10.3)
Chloride: 101 mmol/L (ref 101–111)
Creatinine, Ser: 1 mg/dL (ref 0.44–1.00)
GFR calc Af Amer: >60 mL/min (ref >60)
GFR calc non Af Amer: >60 mL/min (ref >60)
Glucose, Bld: 104 mg/dL — ABNORMAL HIGH (ref 65–99)
Phosphorus: 4 mg/dL (ref 2.5–4.6)
Potassium: 3.6 mmol/L (ref 3.5–5.1)
Sodium: 134 mmol/L — ABNORMAL LOW (ref 135–145)

## 2015-11-14 MED ORDER — PERFLUTREN LIPID MICROSPHERE
1.0000 mL | INTRAVENOUS | Status: AC | PRN
  Administered 2015-11-14: 2 mL via INTRAVENOUS
  Filled 2015-11-14: qty 10

## 2015-11-14 MED ORDER — ACETAMINOPHEN 325 MG PO TABS
650.0000 mg | ORAL_TABLET | Freq: Three times a day (TID) | ORAL | Status: DC | PRN
  Administered 2015-11-14: 650 mg via ORAL
  Filled 2015-11-14: qty 2

## 2015-11-14 NOTE — Progress Notes (Signed)
Echocardiogram 2D Echocardiogram with Definity has been performed.  Tresa Res 11/14/2015, 11:10 AM

## 2015-11-14 NOTE — Progress Notes (Signed)
PROGRESS NOTE    Jasmine Novak  [id number] DOB: 1971-02-26 DOA: 11/12/2015 PCP: No primary care provider on file.  Outpatient Specialists:   Brief Narrative: 45 year old [place] American female, with no PCP and has not followed up with Physician regularly. Patient presents with 2 week history of cough that is productive of yellowish brownish sputum, post nasal drip symptoms and SOB. Patient endorsed dyspnea on exertion and occasional PND with associated ankle swelling. On presentation to the hospital, the CXR revealed pulmonary edema. SBP was 274mmHg on presentation. No prior diagnosis of hypertension prior to presentation. Reports occasional headache, no neck pain, no fever or chills, no GI symptoms and no urinary symptoms.   Assessment & Plan:   Active Problems:   CHF (congestive heart failure) (HCC)  Assessment/Plan  Acute CHF, suspect diastolic Dysfunction.  Accelerated hypertension  Post nasal drip  Possible tracheobronchitis  Morbid obesity   Continue IV diuresis. Like change to oral in am. Follow result of ECHO.  Continue to Monitor renal function and electrolytes   Beta blocker  ACEI  Lipid profile  ECHO  Flonase, Singulair and Zyrtec  Levaquin  Cardiac diet  Needs PCP on discharge, heart failure education and compliance education  DVT prophylaxis:Lovenox Code Status: Full Family Communication: Mother, Father and Daughter Disposition Plan: Home eventually  Consults called: None. Low threshold to consult Caridology  Admission status: Inpatient   Consultants:   None  Subjective: Feeling better. SOB and DOB have improved. BP is better controlled.  Objective: Filed Vitals:   11/13/15 1726 11/13/15 2301 11/14/15 0640 11/14/15 0938  BP: 136/68 132/66 158/68 128/66  Pulse: 93 85 84 89  Temp:  99.3 F (37.4 C) 98.6 F (37 C)   TempSrc:  Oral Oral   Resp:  22 20   Height:      Weight:   139.753 kg (308 lb 1.6 oz)   SpO2:  100%       Intake/Output Summary (Last 24 hours) at 11/14/15 1215 Last data filed at 11/14/15 0857  Gross per 24 hour  Intake    720 ml  Output   3650 ml  Net  -2930 ml   Filed Weights   11/13/15 0650 11/14/15 0640  Weight: 141.522 kg (312 lb) 139.753 kg (308 lb 1.6 oz)    Examination: Constitutional:   Appears calm and comfortable. Obese Eyes:   PERRL and irises appear normal  Normal conjunctivae and lids ENMT:   external ears, nose appear normal Neck:  neck is supple. JVD is difficult to assess due to body habitus  Respiratory:   Improving air entry. Cardiovascular:  S1S2. Abdomen:   Abdomen is obese, soft and non tender. Organs are difficult to assess.  Musculoskeletal:  Leg edema has improved signficantly Neurologic:   Awake and alert. Moves all limbs.     Data Reviewed: I have personally reviewed following labs and imaging studies  CBC:  Recent Labs Lab 11/13/15 0122 11/13/15 0131  WBC 11.9*  --   NEUTROABS 7.2  --   HGB 11.3* 12.9  HCT 35.4* 38.0  MCV 74.8*  --   PLT 385  --    Basic Metabolic Panel:  Recent Labs Lab 11/13/15 0122 11/13/15 0131 11/13/15 0215 11/14/15 0458  NA  --  136  --  134*  K 3.8 7.5* 3.7 3.6  CL  --  101  --  101  CO2  --   --   --  26  GLUCOSE  --  106*  --  104*  BUN  --  15  --  13  CREATININE  --  1.00  --  1.00  CALCIUM  --   --   --  8.6*  PHOS  --   --   --  4.0   GFR: Estimated Creatinine Clearance: 102.6 mL/min (by C-G formula based on Cr of 1). Liver Function Tests:  Recent Labs Lab 11/14/15 0458  ALBUMIN 3.6   No results for input(s): LIPASE, AMYLASE in the last 168 hours. No results for input(s): AMMONIA in the last 168 hours. Coagulation Profile: No results for input(s): INR, PROTIME in the last 168 hours. Cardiac Enzymes:  Recent Labs Lab 11/13/15 0122  TROPONINI <0.03   BNP (last 3 results) No results for input(s): PROBNP in the last 8760 hours. HbA1C: No results for  input(s): HGBA1C in the last 72 hours. CBG: No results for input(s): GLUCAP in the last 168 hours. Lipid Profile:  Recent Labs  11/13/15 0217  CHOL 174  HDL 36*  LDLCALC 106*  TRIG 158*  CHOLHDL 4.8   Thyroid Function Tests: No results for input(s): TSH, T4TOTAL, FREET4, T3FREE, THYROIDAB in the last 72 hours. Anemia Panel: No results for input(s): VITAMINB12, FOLATE, FERRITIN, TIBC, IRON, RETICCTPCT in the last 72 hours. Urine analysis:    Component Value Date/Time   COLORURINE YELLOW 04/11/2011 [place] 04/11/2011 1231   LABSPEC >1.030* 04/11/2011 1231   PHURINE 5.5 04/11/2011 1231   GLUCOSEU NEGATIVE 04/11/2011 1231   HGBUR LARGE* 04/11/2011 [place] 04/11/2011 1231   KETONESUR NEGATIVE 04/11/2011 1231   PROTEINUR NEGATIVE 04/11/2011 1231   UROBILINOGEN 0.2 04/11/2011 1231   NITRITE NEGATIVE 04/11/2011 1231   LEUKOCYTESUR SMALL* 04/11/2011 1231   Sepsis Labs: @LABRCNTIP (procalcitonin:4,lacticidven:4)  )No results found for this or any previous visit (from the past 240 hour(s)).       Radiology Studies: Dg Chest 2 View  11/12/2015  CLINICAL DATA:  Shortness of breath EXAM: CHEST  2 VIEW COMPARISON:  01/07/2015 FINDINGS: Cardiomegaly and vascular pedicle widening. Stable aortic tortuosity. Pulmonary venous congestion accentuated by low volumes. No interstitial edema or effusion. No focal pneumonia. No pneumothorax. IMPRESSION: Cardiomegaly and pulmonary venous congestion. Electronically Signed   By: Monte Fantasia M.D.   On: 11/12/2015 22:25        Scheduled Meds: . amLODipine  10 mg Oral Daily  . carvedilol  3.125 mg Oral BID WC  . enoxaparin (LOVENOX) injection  0.5 mg/kg Subcutaneous Q24H  . fluticasone  2 spray Each Nare Daily  . furosemide  40 mg Intravenous Q12H  . levofloxacin  750 mg Oral Daily  . lisinopril  10 mg Oral Daily  . loratadine  10 mg Oral Daily  . montelukast  10 mg Oral QHS   Continuous  Infusions:    LOS: 1 day    Time spent: Herrick, MD [facility] Pager [phone]  If 7PM-7AM, please contact night-coverage [email or web address] Password [email or web address] 11/14/2015, 12:15 PM

## 2015-11-15 DIAGNOSIS — E669 Obesity, unspecified: Secondary | ICD-10-CM

## 2015-11-15 LAB — RENAL FUNCTION PANEL
Albumin: 3.6 g/dL (ref 3.5–5.0)
Anion gap: 6 (ref 5–15)
BUN: 15 mg/dL (ref 6–20)
CO2: 28 mmol/L (ref 22–32)
Calcium: 8.5 mg/dL — ABNORMAL LOW (ref 8.9–10.3)
Chloride: 103 mmol/L (ref 101–111)
Creatinine, Ser: 0.94 mg/dL (ref 0.44–1.00)
GFR calc Af Amer: >60 mL/min (ref >60)
GFR calc non Af Amer: >60 mL/min (ref >60)
Glucose, Bld: 99 mg/dL (ref 65–99)
Phosphorus: 4.1 mg/dL (ref 2.5–4.6)
Potassium: 3.8 mmol/L (ref 3.5–5.1)
Sodium: 137 mmol/L (ref 135–145)

## 2015-11-15 MED ORDER — LISINOPRIL 10 MG PO TABS: 10.0000 mg | ORAL_TABLET | Freq: Every day | ORAL | Status: DC

## 2015-11-15 MED ORDER — LORATADINE 10 MG PO TABS: 10.0000 mg | ORAL_TABLET | Freq: Every day | ORAL | Status: DC

## 2015-11-15 MED ORDER — CARVEDILOL 3.125 MG PO TABS: 3.1250 mg | ORAL_TABLET | Freq: Two times a day (BID) | ORAL | Status: DC

## 2015-11-15 MED ORDER — FLUTICASONE PROPIONATE 50 MCG/ACT NA SUSP: 2.0000 | Freq: Every day | NASAL | Status: DC

## 2015-11-15 MED ORDER — FUROSEMIDE 40 MG PO TABS: 40.0000 mg | ORAL_TABLET | Freq: Every day | ORAL | Status: DC

## 2015-11-15 MED ORDER — MONTELUKAST SODIUM 10 MG PO TABS: 10.0000 mg | ORAL_TABLET | Freq: Every day | ORAL | Status: DC

## 2015-11-15 MED ORDER — AMLODIPINE BESYLATE 10 MG PO TABS: 10.0000 mg | ORAL_TABLET | Freq: Every day | ORAL | Status: DC

## 2015-11-15 MED ORDER — LEVOFLOXACIN 750 MG PO TABS: 750.0000 mg | ORAL_TABLET | Freq: Every day | ORAL | Status: DC

## 2015-11-15 NOTE — Care Management Note (Addendum)
Case Management Note  Patient Details  Name: Jasmine Novak MRN: [id number] Date of Birth: Feb 05, 1971  Subjective/Objective:     CHF                Action/Plan: Discharge Planning:   NCM spoke to pt and states her insurance has not started as of yet. She will have [place]. Explained she can visit [employer] website or call toll free number on her card to request a provider list. States she has not received her card. She has insurance through her job. States she will follow up on Monday looking for a PCP. Will continue to follow for dc needs.   1200 Provided pt with MATCH letter to receives meds for $3 copay for one time use per year. Contacted pt's pharmacy [facility] and states pt insurance had expired.  Medications are running $400. Explained to pharmacist pt states she may have [place]. They attempted to run [employer] and did not show coverage. Provided pt with [facility] brochure and Sickle Cell Anemia brochure to follow up with one of the outpt [facility] clinics to arrange hospital follow up appt.    Expected Discharge Date:  11/15/2015            Expected Discharge Plan:  Home/Self Care  In-House Referral:  NA  Discharge planning Services  CM Consult  Post Acute Care Choice:  NA Choice offered to:  NA  DME Arranged:  N/A DME Agency:  NA  HH Arranged:  NA HH Agency:  NA  Status of Service:  Completed, signed off  Medicare Important Message Given:    Date Medicare IM Given:    Medicare IM give by:    Date Additional Medicare IM Given:    Additional Medicare Important Message give by:     If discussed at [place] of Stay Meetings, dates discussed:    Additional Comments:  Erenest Rasher, RN 11/15/2015, 9:29 AM

## 2015-11-15 NOTE — Progress Notes (Signed)
Report received from K. Holloway, RN. I agree with previous RN's assessment. Will continue to monitor pt closely. Asaro, Elania Crowl I 

## 2015-11-15 NOTE — Progress Notes (Signed)
Completed D/C teaching. Answered all questions. Pt will be D/C home with family in stable condition. 

## 2015-11-15 NOTE — Discharge Summary (Signed)
Physician Discharge Summary  Patient ID: Jasmine Novak MRN: [id number] DOB/AGE: 11-11-1970 45 y.o.  Admit date: 11/12/2015 Discharge date: 11/15/2015  Admission Diagnoses:  Discharge Diagnoses:  Active Problems:   CHF (congestive heart failure) ([place]), diastolic, acute -Accelerated Hypertension -Post nasal drip -Likely undiagnosed OSA  -Likely Obesity hypoventilation syndrome -Likely Acute bronchitis   Discharged Condition: stable  Hospital Course: 45 year old [place] American female, with no PCP and has not followed up with no Physician regularly. Patient presented  with 2 week history of cough that is productive of yellowish brownish sputum, post nasal drip symptoms and SOB. Patient endorsed dyspnea on exertion and occasional PND with associated ankle swelling. On presentation to the hospital, the CXR revealed pulmonary edema. SBP was 270mmHg on presentation. No prior diagnosis of hypertension prior to presentation. Patient was admitted for further assessment and management. Work up revealed diastolic heart failure. Patient was managed with diuresis, coreg and lisinopril. CHF symptoms have resolved. Patient was also treated with oral antibiotics, Flonase and Loratadine. Patient snores and, likely, has undiagnosed OSA and Obesity hypoventilation syndrome. Patient understands that she will need a PCP on discharge, as well as out patient sleep study.   Consults: None  Significant Diagnostic Studies: ECHO (Diastolic dysfunction)  Discharge Medication - See Med. Rec.  Discharge Exam: Blood pressure 143/61, pulse 91, temperature 98 F (36.7 C), temperature source Oral, resp. rate 18, height 5\' 6"  (1.676 m), weight 138.03 kg (304 lb 4.8 oz), last menstrual period 10/06/2015, SpO2 99 %.  Disposition: 01-Home or Self Care  Discharge Instructions    Call MD for:    Complete by:  As directed   Call MD if symptoms worsen     Diet - low sodium heart healthy    Complete by:  As directed      Discharge instructions    Complete by:  As directed   Follow up with PCP in one week. Check BMP in PCP's office in one week. PCP to refer patient for sleep studies.     Increase activity slowly    Complete by:  As directed             Medication List    STOP taking these medications        aspirin-acetaminophen-caffeine 250-250-65 MG tablet  Commonly known as:  EXCEDRIN MIGRAINE     NYQUIL PO      TAKE these medications        amLODipine 10 MG tablet  Commonly known as:  NORVASC  Take 1 tablet (10 mg total) by mouth daily.     carvedilol 3.125 MG tablet  Commonly known as:  COREG  Take 1 tablet (3.125 mg total) by mouth 2 (two) times daily with a meal.     fluticasone 50 MCG/ACT nasal spray  Commonly known as:  FLONASE  Place 2 sprays into both nostrils daily.     furosemide 40 MG tablet  Commonly known as:  LASIX  Take 1 tablet (40 mg total) by mouth daily.     levofloxacin 750 MG tablet  Commonly known as:  LEVAQUIN  Take 1 tablet (750 mg total) by mouth daily.     lisinopril 10 MG tablet  Commonly known as:  PRINIVIL,ZESTRIL  Take 1 tablet (10 mg total) by mouth daily.     loratadine 10 MG tablet  Commonly known as:  CLARITIN  Take 1 tablet (10 mg total) by mouth daily.     montelukast 10 MG tablet  Commonly  known as:  SINGULAIR  Take 1 tablet (10 mg total) by mouth at bedtime.         SignedBonnell Public 11/15/2015, 10:24 AM

## 2015-11-17 LAB — POCT I-STAT, CHEM 8
BUN: 15 mg/dL (ref 6–20)
CREATININE: 1 mg/dL (ref 0.44–1.00)
Calcium, Ion: 1.1 mmol/L — ABNORMAL LOW (ref 1.12–1.23)
Chloride: 101 mmol/L (ref 101–111)
Glucose, Bld: 106 mg/dL — ABNORMAL HIGH (ref 65–99)
HCT: 38 % (ref 36.0–46.0)
Hemoglobin: 12.9 g/dL (ref 12.0–15.0)
POTASSIUM: 7.5 mmol/L — AB (ref 3.5–5.1)
Sodium: 136 mmol/L (ref 135–145)
TCO2: 32 mmol/L (ref 0–100)

## 2015-12-21 ENCOUNTER — Encounter: Payer: Self-pay | Admitting: Family Medicine

## 2015-12-21 ENCOUNTER — Ambulatory Visit (INDEPENDENT_AMBULATORY_CARE_PROVIDER_SITE_OTHER): Payer: Self-pay | Admitting: Family Medicine

## 2015-12-21 VITALS — BP 142/75 | HR 80 | Temp 98.6°F | Resp 16 | Ht 66.0 in | Wt 304.0 lb

## 2015-12-21 DIAGNOSIS — I5030 Unspecified diastolic (congestive) heart failure: Secondary | ICD-10-CM

## 2015-12-21 DIAGNOSIS — I1A0 Resistant hypertension: Secondary | ICD-10-CM | POA: Insufficient documentation

## 2015-12-21 DIAGNOSIS — I10 Essential (primary) hypertension: Secondary | ICD-10-CM

## 2015-12-21 DIAGNOSIS — J309 Allergic rhinitis, unspecified: Secondary | ICD-10-CM

## 2015-12-21 MED ORDER — FUROSEMIDE 40 MG PO TABS: 40.0000 mg | ORAL_TABLET | Freq: Every day | ORAL | Status: DC

## 2015-12-21 MED ORDER — LORATADINE 10 MG PO TABS: 10.0000 mg | ORAL_TABLET | Freq: Every day | ORAL | Status: DC

## 2015-12-21 MED ORDER — LISINOPRIL 10 MG PO TABS: 10.0000 mg | ORAL_TABLET | Freq: Every day | ORAL | Status: DC

## 2015-12-21 NOTE — Progress Notes (Signed)
Patient ID: Jasmine Novak, female   DOB: Aug 13, 1970, 45 y.o.   MRN: [id number]   Jasmine Novak, is a 45 y.o. female  [id number]  [id number]  DOB - Oct 07, 1970  CC:  Chief Complaint  Patient presents with  . Establish Care  . Joint Swelling    both ankles swelling throughout the day   . Cough       HPI: Jasmine Novak is a 45 y.o. female here to establish care.She was in hospital from 6-8 through 6-11 with CFH (diastolic), pulmonary edema and accelerated hypertension. (See discharge summary below). She reports doing well since discharge. She does report fatigue, nasal congestion, itchy throat, swelling of her fee and legs. She is currently taking amlodipine 10, coreg 3.125 bid, Lasix 40 (ran out a few days ago), lisinopril 10, Flonase, claritin and singular. She is trying to follow a low salt diet but is not exercising.   She has some health maintenance issues which need to be addressed but she is awaiting insurance coverage and would like to wait on this until insurance is in place.       Hospital Course: 45 year old [place] female, with no PCP and has not followed up with no Physician regularly. Patient presented with 2 week history of cough that is productive of yellowish brownish sputum, post nasal drip symptoms and SOB. Patient endorsed dyspnea on exertion and occasional PND with associated ankle swelling. On presentation to the hospital, the CXR revealed pulmonary edema. SBP was 210mmHg on presentation. No prior diagnosis of hypertension prior to presentation. Patient was admitted for further assessment and management. Work up revealed diastolic heart failure. Patient was managed with diuresis, coreg and lisinopril. CHF symptoms have resolved. Patient was also treated with oral antibiotics, Flonase and Loratadine. Patient snores and, likely, has undiagnosed OSA and Obesity hypoventilation syndrome. Patient understands that she will need a PCP on discharge, as well as out  patient sleep study.   No Known Allergies Past Medical History  Diagnosis Date  . Herpes simplex     states hasn't had outbreak since she was "young"   Current Outpatient Prescriptions on File Prior to Visit  Medication Sig Dispense Refill  . amLODipine (NORVASC) 10 MG tablet Take 1 tablet (10 mg total) by mouth daily. 30 tablet 2  . carvedilol (COREG) 3.125 MG tablet Take 1 tablet (3.125 mg total) by mouth 2 (two) times daily with a meal. 60 tablet 2  . fluticasone (FLONASE) 50 MCG/ACT nasal spray Place 2 sprays into both nostrils daily. 1 g 2  . montelukast (SINGULAIR) 10 MG tablet Take 1 tablet (10 mg total) by mouth at bedtime. 30 tablet 1  . levofloxacin (LEVAQUIN) 750 MG tablet Take 1 tablet (750 mg total) by mouth daily. (Patient not taking: Reported on 12/21/2015) 4 tablet 0   No current facility-administered medications on file prior to visit.   Family History  Problem Relation Age of Onset  . Stroke Father   . Heart disease Daughter   . Diabetes Maternal Grandmother    Social History   Social History  . Marital Status: Single    Spouse Name: N/A  . Number of Children: N/A  . Years of Education: N/A   Occupational History  . Not on file.   Social History Main Topics  . Smoking status: Never Smoker   . Smokeless tobacco: Not on file  . Alcohol Use: No     Comment: wine once a month  . Drug Use: No  .  Sexual Activity: Yes    Birth Control/ Protection: Condom   Other Topics Concern  . Not on file   Social History Narrative    Review of Systems: Constitutional: Negative for fever, chills, appetite change, weight loss. Positive for fatigue Skin: Negative for rashes or lesions of concern. HENT: Negative for ear pain, ear discharge.nose bleeds. Positive for nasal congestion Eyes: Negative for pain, discharge, redness, itching and visual disturbance. Positive for perceived decrease in vision Neck: Negative for pain, stiffness Respiratory: Negative for  shortness of breath,  Wheezing. Positive for some coughing Cardiovascular: Negative for chest pain, palpitations. Positive for swelling of feet and legs since out of Lasix.  Gastrointestinal: Negative for abdominal pain, nausea, vomiting, diarrhea, constipations Genitourinary: Negative for dysuria, urgency, frequency, hematuria,  Musculoskeletal: Positive for back pain. Negative for joint pain, joint  swelling, and gait problem.Negative for weakness. Neurological: Negative for dizziness, tremors, seizures, syncope,   light-headedness, numbness and headaches.  Hematological: Negative for easy bruising or bleeding Psychiatric/Behavioral: Negative for depression, anxiety, decreased concentration, confusion   Objective:   Filed Vitals:   12/21/15 0945  BP: 142/75  Pulse: 80  Temp: 98.6 F (37 C)  Resp: 16    Physical Exam: Constitutional: Patient appears well-developed and well-nourished. No distress. HENT: Normocephalic, atraumatic, External right and left ear normal. Oropharynx is clear and moist.  Eyes: Conjunctivae and EOM are normal. PERRLA, no scleral icterus. Neck: Normal ROM. Neck supple. No lymphadenopathy, No thyromegaly. CVS: RRR, S1/S2 +, no murmurs, no gallops, no rubs Pulmonary: Effort and breath sounds normal, no stridor, rhonchi, wheezes, rales.  Abdominal: Soft. Normoactive BS,, no distension, tenderness, rebound or guarding.  Musculoskeletal: Normal range of motion. No edema and no tenderness.  Neuro: Alert.Normal muscle tone coordination. Non-focal Skin: Skin is warm and dry. No rash noted. Not diaphoretic. No erythema. No pallor. Psychiatric: Normal mood and affect. Behavior, judgment, thought content normal.  Lab Results  Component Value Date   WBC 11.9* 11/13/2015   HGB 12.9 11/13/2015   HGB 12.9 11/13/2015   HCT 38.0 11/13/2015   HCT 38.0 11/13/2015   MCV 74.8* 11/13/2015   PLT 385 11/13/2015   Lab Results  Component Value Date   CREATININE 0.94  11/15/2015   BUN 15 11/15/2015   NA 137 11/15/2015   K 3.8 11/15/2015   CL 103 11/15/2015   CO2 28 11/15/2015    No results found for: HGBA1C Lipid Panel     Component Value Date/Time   CHOL 174 11/13/2015 0217   TRIG 158* 11/13/2015 0217   HDL 36* 11/13/2015 0217   CHOLHDL 4.8 11/13/2015 0217   VLDL 32 11/13/2015 0217   LDLCALC 106* 11/13/2015 0217       Assessment and plan:   1. Essential hypertension  - lisinopril (PRINIVIL,ZESTRIL) 10 MG tablet; Take 1 tablet (10 mg total) by mouth daily.  Dispense: 30 tablet; Refill: 3  2. Diastolic congestive heart failure, unspecified congestive heart failure chronicity (HCC) - furosemide (LASIX) 40 MG tablet; Take 1 tablet (40 mg total) by mouth daily.  Dispense: 30 tablet; Refill: 3  3. Allergic rhinitis, unspecified allergic rhinitis type  - loratadine (CLARITIN) 10 MG tablet; Take 1 tablet (10 mg total) by mouth daily.  Dispense: 30 tablet; Refill: 0   Follow-up as soon as insurance in place or 6 months.  The patient was given clear instructions to go to ER or return to medical center if symptoms don't improve, worsen or new problems develop. The patient verbalized understanding.  Micheline Chapman FNP  12/21/2015, 12:06 PM

## 2015-12-21 NOTE — Patient Instructions (Addendum)
Continue same medications Make an appointment  As soon as you get insurance. Call and let us know so I can schedule a sleep study.

## 2016-02-24 ENCOUNTER — Other Ambulatory Visit: Payer: Self-pay

## 2016-02-24 DIAGNOSIS — J309 Allergic rhinitis, unspecified: Secondary | ICD-10-CM

## 2016-02-24 MED ORDER — LORATADINE 10 MG PO TABS: 10.0000 mg | ORAL_TABLET | Freq: Every day | ORAL | 2 refills | Status: DC

## 2016-02-24 MED ORDER — AMLODIPINE BESYLATE 10 MG PO TABS: 10.0000 mg | ORAL_TABLET | Freq: Every day | ORAL | 2 refills | Status: DC

## 2016-02-24 MED ORDER — CARVEDILOL 3.125 MG PO TABS: 3.1250 mg | ORAL_TABLET | Freq: Two times a day (BID) | ORAL | 2 refills | Status: DC

## 2016-02-24 NOTE — Telephone Encounter (Signed)
Refills sent into pharmacy. Thanks!  

## 2016-04-21 ENCOUNTER — Other Ambulatory Visit: Payer: Self-pay

## 2016-04-21 DIAGNOSIS — I5030 Unspecified diastolic (congestive) heart failure: Secondary | ICD-10-CM

## 2016-04-21 DIAGNOSIS — I10 Essential (primary) hypertension: Secondary | ICD-10-CM

## 2016-04-21 MED ORDER — FUROSEMIDE 40 MG PO TABS
40.0000 mg | ORAL_TABLET | Freq: Every day | ORAL | 3 refills | Status: DC
Start: 2016-04-21 — End: 2016-06-08

## 2016-04-21 MED ORDER — LISINOPRIL 10 MG PO TABS: 10.0000 mg | ORAL_TABLET | Freq: Every day | ORAL | 3 refills | Status: DC

## 2016-04-21 MED ORDER — CARVEDILOL 3.125 MG PO TABS: 3.1250 mg | ORAL_TABLET | Freq: Two times a day (BID) | ORAL | 2 refills | Status: DC

## 2016-04-21 NOTE — Telephone Encounter (Signed)
Refills sent into pharmacy. Thanks!  

## 2016-06-08 ENCOUNTER — Encounter: Payer: Self-pay | Admitting: Family Medicine

## 2016-06-08 ENCOUNTER — Ambulatory Visit (INDEPENDENT_AMBULATORY_CARE_PROVIDER_SITE_OTHER): Payer: Self-pay | Admitting: Family Medicine

## 2016-06-08 ENCOUNTER — Ambulatory Visit (HOSPITAL_COMMUNITY)
Admission: RE | Admit: 2016-06-08 | Discharge: 2016-06-08 | Disposition: A | Discharge status: Home / Self Care | Payer: Self-pay | Source: Ambulatory Visit | Attending: Family Medicine | Admitting: Family Medicine

## 2016-06-08 ENCOUNTER — Other Ambulatory Visit: Payer: Self-pay | Admitting: Family Medicine

## 2016-06-08 VITALS — BP 141/74 | HR 91 | Temp 98.1°F | Resp 16 | Ht 66.0 in | Wt 310.0 lb

## 2016-06-08 DIAGNOSIS — R05 Cough: Secondary | ICD-10-CM | POA: Insufficient documentation

## 2016-06-08 DIAGNOSIS — I5031 Acute diastolic (congestive) heart failure: Secondary | ICD-10-CM

## 2016-06-08 DIAGNOSIS — I10 Essential (primary) hypertension: Secondary | ICD-10-CM

## 2016-06-08 DIAGNOSIS — R7303 Prediabetes: Secondary | ICD-10-CM

## 2016-06-08 DIAGNOSIS — R600 Localized edema: Secondary | ICD-10-CM

## 2016-06-08 DIAGNOSIS — R053 Chronic cough: Secondary | ICD-10-CM

## 2016-06-08 DIAGNOSIS — I5030 Unspecified diastolic (congestive) heart failure: Secondary | ICD-10-CM

## 2016-06-08 LAB — CBC WITH DIFFERENTIAL/PLATELET
BASOS PCT: 0 %
Basophils Absolute: 0 cells/uL (ref 0–200)
Eosinophils Absolute: 180 cells/uL (ref 15–500)
Eosinophils Relative: 2 %
HCT: 35.8 % (ref 35.0–45.0)
Hemoglobin: 11.2 g/dL — ABNORMAL LOW (ref 11.7–15.5)
Lymphocytes Relative: 27 %
Lymphs Abs: 2430 cells/uL (ref 850–3900)
MCH: 24.7 pg — ABNORMAL LOW (ref 27.0–33.0)
MCHC: 31.3 g/dL — ABNORMAL LOW (ref 32.0–36.0)
MCV: 79 fL — AB (ref 80.0–100.0)
MONO ABS: 720 {cells}/uL (ref 200–950)
MONOS PCT: 8 %
MPV: 9.6 fL (ref 7.5–12.5)
NEUTROS ABS: 5670 {cells}/uL (ref 1500–7800)
Neutrophils Relative %: 63 %
PLATELETS: 410 10*3/uL — AB (ref 140–400)
RBC: 4.53 MIL/uL (ref 3.80–5.10)
RDW: 16.4 % — ABNORMAL HIGH (ref 11.0–15.0)
WBC: 9 10*3/uL (ref 3.8–10.8)

## 2016-06-08 LAB — POCT URINALYSIS DIP (DEVICE)
Bilirubin Urine: NEGATIVE
GLUCOSE, UA: NEGATIVE mg/dL
Hgb urine dipstick: NEGATIVE
KETONES UR: NEGATIVE mg/dL
Nitrite: NEGATIVE
PH: 6 (ref 5.0–8.0)
Protein, ur: NEGATIVE mg/dL
Specific Gravity, Urine: 1.015 (ref 1.005–1.030)
Urobilinogen, UA: 0.2 mg/dL (ref 0.0–1.0)

## 2016-06-08 LAB — POCT GLYCOSYLATED HEMOGLOBIN (HGB A1C): HEMOGLOBIN A1C: 6.3

## 2016-06-08 MED ORDER — PROMETHAZINE HCL 6.25 MG/5ML PO SOLN: 5.0000 mL | Freq: Four times a day (QID) | ORAL | 0 refills | Status: DC | PRN

## 2016-06-08 MED ORDER — FUROSEMIDE 40 MG PO TABS: 80.0000 mg | ORAL_TABLET | Freq: Two times a day (BID) | ORAL | 3 refills | Status: DC

## 2016-06-08 MED ORDER — CARVEDILOL 6.25 MG PO TABS: 6.2500 mg | ORAL_TABLET | Freq: Two times a day (BID) | ORAL | 2 refills | Status: DC

## 2016-06-08 MED ORDER — METFORMIN HCL 500 MG PO TABS: 500.0000 mg | ORAL_TABLET | Freq: Every day | ORAL | 2 refills | Status: DC

## 2016-06-08 NOTE — Progress Notes (Signed)
Subjective:    Patient ID: Jasmine Novak, female    DOB: [birth year], 46 y.o.   MRN: [id number]  HPI   Jasmine Novak, a 46 year old female with a history of heart failure, hypertension, and morbid obesity presents complaining of a worsening cough for greater than 3 weeks.  Patient complains of dyspnea and productive cough. The cough is productive with shortness of breath and dyspnea. Jasmine Novak is currently sleeping on 3 pillows. Cough is aggravated by reclining position.  Associated symptoms include:shortness of breath.  Patient does not have a history of asthma. Patient does have a history of environmental allergens.Patient does not have a history of smoking. Patient has had a  previous Chest X-ray in June 2017, which showed cardiomegaly and pulmonary venous congestion. Patient is also here for hypertension.  Jasmine Novak  is not adherent to low salt diet.  Jasmine Novak is not checking blood pressures at home.  Cardiac symptoms dyspnea, lower extremity edema and orthopnea. Patient denies chest pain, syncope and tachypnea.  Cardiovascular risk factors: hypertension, obesity (BMI >= 30 kg/m2) and sedentary lifestyle.  Past Medical History:  Diagnosis Date  . Herpes simplex    states hasn't had outbreak since Jasmine Novak was "young"   Social History   Social History  . Marital status: Single    Spouse name: N/A  . Number of children: N/A  . Years of education: N/A   Occupational History  . Not on file.   Social History Main Topics  . Smoking status: Never Smoker  . Smokeless tobacco: Not on file  . Alcohol use No     Comment: wine once a month  . Drug use: No  . Sexual activity: Yes    Birth control/ protection: Condom   Other Topics Concern  . Not on file   Social History Narrative  . No narrative on file   There is no immunization history on file for this patient.   Review of Systems  Constitutional: Positive for fatigue and unexpected weight change (weight gain). Negative for fever.  HENT:  Negative.   Eyes: Negative.   Respiratory: Positive for cough and shortness of breath. Negative for choking and chest tightness.   Cardiovascular: Positive for leg swelling. Negative for chest pain and palpitations.  Gastrointestinal: Negative.   Endocrine: Negative for polydipsia, polyphagia and polyuria.  Genitourinary: Negative.   Musculoskeletal: Negative.   Skin: Negative.   Neurological: Negative.  Negative for dizziness, seizures and light-headedness.  Hematological: Negative.   Psychiatric/Behavioral: Negative.        Objective:   Physical Exam  Constitutional: Jasmine Novak is oriented to person, place, and time. Jasmine Novak appears well-developed. No distress.  Morbid obesity  HENT:  Head: Normocephalic and atraumatic.  Right Ear: External ear normal.  Left Ear: External ear normal.  Nose: Nose normal.  Mouth/Throat: Oropharynx is clear and moist.  Eyes: Conjunctivae and EOM are normal. Pupils are equal, round, and reactive to light.  Neck: Normal range of motion. Neck supple.  Cardiovascular: Normal rate, regular rhythm, normal heart sounds and intact distal pulses.  Exam reveals no gallop and no friction rub.   No murmur heard. Pulses:      Dorsalis pedis pulses are 1+ on the right side, and 1+ on the left side.  Bilateral lower extremity edema (2 + pitting)  Pulmonary/Chest: Jasmine Novak has decreased breath sounds in the left lower field. Jasmine Novak has rales.  Abdominal:  Pendulous abdomen  Musculoskeletal: Normal range of motion.  Neurological: Jasmine Novak is  alert and oriented to person, place, and time. Jasmine Novak has normal reflexes.  Skin: Skin is warm and dry. Jasmine Novak is not diaphoretic.  Psychiatric: Jasmine Novak has a normal mood and affect. Her behavior is normal. Judgment and thought content normal.         BP (!) 141/74 (BP Location: Right Arm, Patient Position: Sitting, Cuff Size: Large)   Pulse 91   Temp 98.1 F (36.7 C) (Oral)   Resp 16   Ht 5\' 6"  (1.676 m)   Wt (!) 310 lb (140.6 kg)   LMP  05/16/2016   SpO2 100%   BMI 50.04 kg/m  Assessment & Plan:   1. Essential hypertension Blood pressure is above goal on current medication regimen. Will discontinue ACE inhibitor at this time due to side effects. Will continue Carvedilol and increase dose to 6.25 mg BID - COMPLETE METABOLIC PANEL WITH GFR - CBC with Differential - EKG 12-Lead  2. Acute diastolic congestive heart failure (HCC) Will increase carvedilol and furosemide. Will send a referral to cardiology. I will defer to cardiology for further work-up and treatment of CHF. Reviewed echocardio - Brain natriuretic peptide - carvedilol (COREG) 6.25 MG tablet; Take 1 tablet (6.25 mg total) by mouth 2 (two) times daily with a meal.  Dispense: 60 tablet; Refill: 2 - furosemide (LASIX) 40 MG tablet; Take 2 tablets (80 mg total) by mouth 2 (two) times daily.  Dispense: 60 tablet; Refill: 3 - EKG 12-Lead  3. Severe obesity (BMI >= 40) (HCC) Recommend a lowfat, low carbohydrate diet divided over 5-6 small meals, adequate water intake, and activity.  - HgB A1c  4. Persistent cough for 3 weeks or longer - DG Chest 2 View; Future - Promethazine HCl 6.25 MG/5ML SOLN; Take 5 mLs (6.25 mg total) by mouth every 6 (six) hours as needed.  Dispense: 560 mL; Refill: 0  5. Lower extremity edema - furosemide (LASIX) 40 MG tablet; Take 2 tablets (80 mg total) by mouth 2 (two) times daily.  Dispense: 60 tablet; Refill: 3  6. Diastolic congestive heart failure, unspecified congestive heart failure chronicity (HCC) - furosemide (LASIX) 40 MG tablet; Take 2 tablets (80 mg total) by mouth 2 (two) times daily.  Dispense: 60 tablet; Refill: 3    Preventative maintenance: Pap smear: 3 months Mammogram: Referral for mammogram     RTC: 2 weeks for BMP; Will follow up by phone with laboratory results    Dorena Dew, FNP

## 2016-06-08 NOTE — Patient Instructions (Addendum)
1. Will discontinue Lisinopril 10 mg due to persistent cough.  2. Will increase Carvedilol to 6.25 mg twice daily 3. Check blood pressure and pulse daily.  4. Check weights daily and keep a journal 5. Will send a referral to cardiology 6. Will increase Lasix Furosemide to 40 mg twice daily 7. Will follow up by phone with laboratory results 8. Will start Promethazine 6.25 mg every 6 hours as needed to suppress cough 9. Will follow up in 2 weeks to re-check potassium level  Heart Failure Heart failure means your heart has trouble pumping blood. This makes it hard for your body to work well. Heart failure is usually a long-term (chronic) condition. You must take good care of yourself and follow your doctor's treatment plan. HOME CARE  Take your heart medicine as told by your doctor.  Do not stop taking medicine unless your doctor tells you to.  Do not skip any dose of medicine.  Refill your medicines before they run out.  Take other medicines only as told by your doctor or pharmacist.  Stay active if told by your doctor. The elderly and people with severe heart failure should talk with a doctor about physical activity.  Eat heart-healthy foods. Choose foods that are without trans fat and are low in saturated fat, cholesterol, and salt (sodium). This includes fresh or frozen fruits and vegetables, fish, lean meats, fat-free or low-fat dairy foods, whole grains, and high-fiber foods. Lentils and dried peas and beans (legumes) are also good choices.  Limit salt if told by your doctor.  Cook in a healthy way. Roast, grill, broil, bake, poach, steam, or stir-fry foods.  Limit fluids as told by your doctor.  Weigh yourself every morning. Do this after you pee (urinate) and before you eat breakfast. Write down your weight to give to your doctor.  Take your blood pressure and write it down if your doctor tells you to.  Ask your doctor how to check your pulse. Check your pulse as  told.  Lose weight if told by your doctor.  Stop smoking or chewing tobacco. Do not use gum or patches that help you quit without your doctor's approval.  Schedule and go to doctor visits as told.  Nonpregnant women should have no more than 1 drink a day. Men should have no more than 2 drinks a day. Talk to your doctor about drinking alcohol.  Stop illegal drug use.  Stay current with shots (immunizations).  Manage your health conditions as told by your doctor.  Learn to manage your stress.  Rest when you are tired.  If it is really hot outside:  Avoid intense activities.  Use air conditioning or fans, or get in a cooler place.  Avoid caffeine and alcohol.  Wear loose-fitting, lightweight, and light-colored clothing.  If it is really cold outside:  Avoid intense activities.  Layer your clothing.  Wear mittens or gloves, a hat, and a scarf when going outside.  Avoid alcohol.  Learn about heart failure and get support as needed.  Get help to maintain or improve your quality of life and your ability to care for yourself as needed. GET HELP IF:   You gain weight quickly.  You are more short of breath than usual.  You cannot do your normal activities.  You tire easily.  You cough more than normal, especially with activity.  You have any or more puffiness (swelling) in areas such as your hands, feet, ankles, or belly (abdomen).  You cannot  sleep because it is hard to breathe.  You feel like your heart is beating fast (palpitations).  You get dizzy or light-headed when you stand up. GET HELP RIGHT AWAY IF:   You have trouble breathing.  There is a change in mental status, such as becoming less alert or not being able to focus.  You have chest pain or discomfort.  You faint. MAKE SURE YOU:   Understand these instructions.  Will watch your condition.  Will get help right away if you are not doing well or get worse. This information is not intended  to replace advice given to you by your health care provider. Make sure you discuss any questions you have with your health care provider. Document Released: 03/01/2008 Document Revised: 06/13/2014 Document Reviewed: 07/09/2012 Elsevier Interactive Patient Education  2017 [employer].  Managing Your Hypertension Hypertension is commonly called high blood pressure. Blood pressure is a measurement of how strongly your blood is pressing against the walls of your arteries. Arteries are blood vessels that carry blood from your heart throughout your body. Blood pressure does not stay the same. It rises when you are active, excited, or nervous. It lowers when you are sleeping or relaxed. If the numbers that measure your blood pressure stay above normal most of the time, you are at risk for health problems. Hypertension is a long-term (chronic) condition in which blood pressure is elevated. This condition often has no signs or symptoms. The cause of the condition is usually not known. What are blood pressure readings? A blood pressure reading is recorded as two numbers, such as "120 over 80" (or 120/80). The first ("top") number is called the systolic pressure. It is a measure of the pressure in your arteries as the heart beats. The second ("bottom") number is called the diastolic pressure. It is a measure of the pressure in your arteries as the heart relaxes between beats. What does my blood pressure reading mean? Blood pressure is classified into four stages. Based on your blood pressure reading, your health care provider may use the following stages to determine what type of treatment, if any, is needed. Systolic pressure and diastolic pressure are measured in a unit called mm Hg. Normal  Systolic pressure: below 123456.  Diastolic pressure: below 80. Prehypertension  Systolic pressure: 123456.  Diastolic pressure: [id number]. Hypertension stage 1  Systolic pressure: A999333.  Diastolic pressure:  A999333. Hypertension stage 2  Systolic pressure: 0000000 or above.  Diastolic pressure: 123XX123 or above. What health risks are associated with hypertension? Managing your hypertension is an important responsibility. Uncontrolled hypertension can lead to:  A heart attack.  A stroke.  A weakened blood vessel (aneurysm).  Heart failure.  Kidney damage.  Eye damage.  Metabolic syndrome.  Memory and concentration problems. What changes can I make to manage my hypertension? Hypertension can be managed effectively by making lifestyle changes and possibly by taking medicines. Your health care provider will help you come up with a plan to bring your blood pressure within a normal range. Your plan should include the following: Monitoring  Monitor your blood pressure at home as told by your health care provider. Your personal target blood pressure may vary depending on your medical conditions, your age, and other factors.  Have your blood pressure rechecked as told by your health care provider. Lifestyle  Lose weight if necessary.  Get at least 30-45 minutes of aerobic exercise at least 4 times a week.  Do not use any products that contain  nicotine or tobacco, such as cigarettes and e-cigarettes. If you need help quitting, ask your health care provider.  Learn ways to reduce stress.  Control any chronic conditions, such as high cholesterol or diabetes. Eating and drinking  Follow the DASH diet. This diet is high in fruits, vegetables, and whole grains. It is low in salt, red meat, and added sugars.  Keep your sodium intake below 2,300 mg per day.  Limit alcoholic beverages. Communication  Review all the medicines you take with your health care provider because there may be side effects or interactions.  Talk with your health care provider about your diet, exercise habits, and other lifestyle factors that may be contributing to hypertension.  See your health care provider  regularly. Your health care provider can help you create and adjust your plan for managing hypertension. Will I need medicine to control my blood pressure? Your health care provider may prescribe medicine if lifestyle changes are not enough to get your blood pressure under control, and if one of the following is true:  You are 43-61 years of age, and your systolic blood pressure is 140 or higher.  You are 49 years of age or older, and your systolic blood pressure is 150 or higher.  Your diastolic blood pressure is 90 or higher.  You have diabetes, and your systolic blood pressure is over [id number] or your diastolic blood pressure is over 90.  You have kidney disease, and your blood pressure is above 140/90.  You have heart disease or a history of stroke, and your blood pressure is 140/90 or higher. Take medicines only as told by your health care provider. Follow the directions carefully. Blood pressure medicines must be taken as prescribed. The medicine does not work as well when you skip doses. Skipping doses also puts you at risk for problems. Contact a health care provider if:  You think you are having a reaction to medicines you have taken.  You have repeated (recurrent) headaches.  You feel dizzy.  You have swelling in your ankles.  You have trouble with your vision. Get help right away if:  You develop a severe headache or confusion.  You have unusual weakness or numbness, or you feel faint.  You have severe pain in your chest or abdomen.  You vomit repeatedly.  You have trouble breathing. This information is not intended to replace advice given to you by your health care provider. Make sure you discuss any questions you have with your health care provider. Document Released: 02/15/2012 Document Revised: 01/26/2016 Document Reviewed: 08/21/2015 Elsevier Interactive Patient Education  2017 [employer]  Obesity, Adult Introduction Obesity is having too much body fat. If  you have a BMI of 30 or more, you are obese. BMI is a number that explains how much body fat you have. Obesity is often caused by taking in (consuming) more calories than your body uses. Obesity can cause serious health problems. Changing your lifestyle can help to treat obesity. Follow these instructions at home: Eating and drinking  Follow advice from your doctor about what to eat and drink. Your doctor may tell you to:  Cut down on (limit) fast foods, sweets, and processed snack foods.  Choose low-fat options. For example, choose low-fat milk instead of whole milk.  Eat 5 or more servings of fruits or vegetables every day.  Eat at home more often. This gives you more control over what you eat.  Choose healthy foods when you eat out.  Learn what  a healthy portion size is. A portion size is the amount of a certain food that is healthy for you to eat at one time. This is different for each person.  Keep low-fat snacks available.  Avoid sugary drinks. These include soda, fruit juice, iced tea that is sweetened with sugar, and flavored milk.  Eat a healthy breakfast.  Drink enough water to keep your pee (urine) clear or pale yellow.  Do not go without eating for long periods of time (do not fast).  Do not go on popular or trendy diets (fad diets). Physical Activity  Exercise often, as told by your doctor. Ask your doctor:  What types of exercise are safe for you.  How often you should exercise.  Warm up and stretch before being active.  Do slow stretching after being active (cool down).  Rest between times of being active. Lifestyle  Limit how much time you spend in front of your TV, computer, or video game system (be less sedentary).  Find ways to reward yourself that do not involve food.  Limit alcohol intake to no more than 1 drink a day for nonpregnant women and 2 drinks a day for men. One drink equals 12 oz of beer, 5 oz of wine, or 1 oz of hard liquor. General  instructions  Keep a weight loss journal. This can help you keep track of:  The food that you eat.  The exercise that you do.  Take over-the-counter and prescription medicines only as told by your doctor.  Take vitamins and supplements only as told by your doctor.  Think about joining a support group. Your doctor may be able to help with this.  Keep all follow-up visits as told by your doctor. This is important. Contact a doctor if:  You cannot meet your weight loss goal after you have changed your diet and lifestyle for 6 weeks. This information is not intended to replace advice given to you by your health care provider. Make sure you discuss any questions you have with your health care provider. Document Released: 08/15/2011 Document Revised: 10/29/2015 Document Reviewed: 03/11/2015  2017 Elsevier

## 2016-06-08 NOTE — Progress Notes (Signed)
Reviewed labs. Hemoglobin a1c is 6.3. Will start Metformin 500 mg with breakfast. Also, recommend a carbohydrate modified diet at discussed.   Meds ordered this encounter  Medications  . metFORMIN (GLUCOPHAGE) 500 MG tablet    Sig: Take 1 tablet (500 mg total) by mouth daily with breakfast.    Dispense:  30 tablet    Refill:  2     Neena Beecham M, FNP

## 2016-06-09 ENCOUNTER — Other Ambulatory Visit: Payer: Self-pay | Admitting: Family Medicine

## 2016-06-09 DIAGNOSIS — E875 Hyperkalemia: Secondary | ICD-10-CM

## 2016-06-09 LAB — COMPLETE METABOLIC PANEL WITH GFR
ALBUMIN: 3.8 g/dL (ref 3.6–5.1)
ALK PHOS: 96 U/L (ref 33–115)
ALT: 16 U/L (ref 6–29)
AST: 15 U/L (ref 10–35)
BILIRUBIN TOTAL: 0.3 mg/dL (ref 0.2–1.2)
BUN: 10 mg/dL (ref 7–25)
CO2: 21 mmol/L (ref 20–31)
CREATININE: 0.65 mg/dL (ref 0.50–1.10)
Calcium: 8.9 mg/dL (ref 8.6–10.2)
Chloride: 103 mmol/L (ref 98–110)
GFR, Est African American: >89 mL/min (ref >=60)
GLUCOSE: 85 mg/dL (ref 65–99)
Potassium: 4.3 mmol/L (ref 3.5–5.3)
SODIUM: 138 mmol/L (ref 135–146)
TOTAL PROTEIN: 7.7 g/dL (ref 6.1–8.1)

## 2016-06-09 LAB — BRAIN NATRIURETIC PEPTIDE: Brain Natriuretic Peptide: <4 pg/mL (ref <100)

## 2016-06-10 ENCOUNTER — Other Ambulatory Visit (INDEPENDENT_AMBULATORY_CARE_PROVIDER_SITE_OTHER): Payer: Self-pay

## 2016-06-10 ENCOUNTER — Telehealth: Payer: Self-pay

## 2016-06-10 DIAGNOSIS — E875 Hyperkalemia: Secondary | ICD-10-CM

## 2016-06-10 LAB — BASIC METABOLIC PANEL
BUN: 18 mg/dL (ref 7–25)
CHLORIDE: 99 mmol/L (ref 98–110)
CO2: 27 mmol/L (ref 20–31)
Calcium: 9.5 mg/dL (ref 8.6–10.2)
Creat: 1.15 mg/dL — ABNORMAL HIGH (ref 0.50–1.10)
Glucose, Bld: 98 mg/dL (ref 65–99)
POTASSIUM: 4.1 mmol/L (ref 3.5–5.3)
Sodium: 135 mmol/L (ref 135–146)

## 2016-06-10 NOTE — Telephone Encounter (Signed)
-----   Message from Dorena Dew, [place] sent at 06/09/2016  2:44 PM EST ----- Regarding: lab results Please notify patient that potassium is markedly elevated. Please have her return to clinic immediately to repeat potassium.   Thanks ----- Message ----- From: Adelina Mings, LPN Sent: [id number]   9:59 AM To: Dorena Dew, FNP

## 2016-06-10 NOTE — Telephone Encounter (Signed)
Called, no answer left message on both Numbers for patient to call back as soon as possible. Thanks!

## 2016-06-10 NOTE — Telephone Encounter (Signed)
Called left message, patient had returned call while we were on lunch.

## 2016-06-13 ENCOUNTER — Other Ambulatory Visit: Payer: Self-pay

## 2016-06-23 ENCOUNTER — Ambulatory Visit: Payer: Self-pay | Admitting: Family Medicine

## 2016-06-27 ENCOUNTER — Ambulatory Visit (INDEPENDENT_AMBULATORY_CARE_PROVIDER_SITE_OTHER): Payer: Self-pay | Admitting: Family Medicine

## 2016-06-27 ENCOUNTER — Encounter: Payer: Self-pay | Admitting: Family Medicine

## 2016-06-27 VITALS — BP 149/80 | HR 95 | Temp 98.1°F | Resp 18 | Ht 66.0 in | Wt 308.0 lb

## 2016-06-27 DIAGNOSIS — I5030 Unspecified diastolic (congestive) heart failure: Secondary | ICD-10-CM

## 2016-06-27 DIAGNOSIS — I5031 Acute diastolic (congestive) heart failure: Secondary | ICD-10-CM

## 2016-06-27 DIAGNOSIS — R600 Localized edema: Secondary | ICD-10-CM

## 2016-06-27 DIAGNOSIS — I10 Essential (primary) hypertension: Secondary | ICD-10-CM

## 2016-06-27 LAB — BASIC METABOLIC PANEL
BUN: 12 mg/dL (ref 7–25)
CO2: 22 mmol/L (ref 20–31)
CREATININE: 1 mg/dL (ref 0.50–1.10)
Calcium: 8.9 mg/dL (ref 8.6–10.2)
Chloride: 101 mmol/L (ref 98–110)
Glucose, Bld: 110 mg/dL — ABNORMAL HIGH (ref 65–99)
POTASSIUM: 3.8 mmol/L (ref 3.5–5.3)
Sodium: 136 mmol/L (ref 135–146)

## 2016-06-27 MED ORDER — FUROSEMIDE 40 MG PO TABS: 40.0000 mg | ORAL_TABLET | Freq: Every day | ORAL | 5 refills | Status: DC

## 2016-06-27 NOTE — Progress Notes (Signed)
Subjective:    Patient ID: Jasmine Novak, female    DOB: 1970-09-05, 46 y.o.   MRN: [id number]  HPI  Jasmine. Jasmine Novak, a 46 year old female with a history of heart failure, hypertension, and morbid obesity presents for a 1 month follow up of chronic conditions. Jasmine Novak reports that cough has dissipated since discontinuing Lisinopril. Patient is also here for hypertension. Blood pressure is above goal on current medication regimen.  She  is not adherent to low salt diet.  She is not checking blood pressures at home.  Cardiac symptoms dyspnea, lower extremity edema and orthopnea. Patient denies chest pain, syncope and tachypnea.  Cardiovascular risk factors: hypertension, obesity (BMI >= 30 kg/m2) and sedentary lifestyle.  Past Medical History:  Diagnosis Date  . Herpes simplex    states hasn't had outbreak since she was "young"   Social History   Social History  . Marital status: Single    Spouse name: N/A  . Number of children: N/A  . Years of education: N/A   Occupational History  . Not on file.   Social History Main Topics  . Smoking status: Never Smoker  . Smokeless tobacco: Not on file  . Alcohol use No     Comment: wine once a month  . Drug use: No  . Sexual activity: Yes    Birth control/ protection: Condom   Other Topics Concern  . Not on file   Social History Narrative  . No narrative on file   There is no immunization history on file for this patient.   Review of Systems  Constitutional: Positive for fatigue and unexpected weight change (weight gain). Negative for fever.  HENT: Negative.   Eyes: Negative.   Respiratory: Positive for shortness of breath. Negative for choking and chest tightness.   Cardiovascular: Positive for leg swelling. Negative for chest pain and palpitations.  Gastrointestinal: Negative.   Endocrine: Negative for polydipsia, polyphagia and polyuria.  Genitourinary: Negative.   Musculoskeletal: Negative.   Skin: Negative.    Neurological: Negative.  Negative for dizziness, seizures and light-headedness.  Hematological: Negative.   Psychiatric/Behavioral: Negative.        Objective:   Physical Exam  Constitutional: She is oriented to person, place, and time. She appears well-developed. No distress.  Morbid obesity  HENT:  Head: Normocephalic and atraumatic.  Right Ear: External ear normal.  Left Ear: External ear normal.  Nose: Nose normal.  Mouth/Throat: Oropharynx is clear and moist.  Eyes: Conjunctivae and EOM are normal. Pupils are equal, round, and reactive to light.  Neck: Normal range of motion. Neck supple.  Cardiovascular: Normal rate, regular rhythm, normal heart sounds and intact distal pulses.  Exam reveals no gallop and no friction rub.   No murmur heard. Pulses:      Dorsalis pedis pulses are 1+ on the right side, and 1+ on the left side.  Bilateral lower extremity edema (2 + pitting)  Pulmonary/Chest: She has decreased breath sounds in the left lower field. She has rales.  Abdominal:  Pendulous abdomen  Musculoskeletal: Normal range of motion.  Neurological: She is alert and oriented to person, place, and time. She has normal reflexes.  Skin: Skin is warm and dry. She is not diaphoretic.  Psychiatric: She has a normal mood and affect. Her behavior is normal. Judgment and thought content normal.       BP (!) 149/80 (BP Location: Left Arm, Patient Position: Sitting, Cuff Size: Large)   Pulse 95  Temp 98.1 F (36.7 C) (Oral)   Resp 18   Ht 5\' 6"  (1.676 m)   Wt (!) 308 lb (139.7 kg)   LMP 06/16/2016   SpO2 100%   BMI 49.71 kg/m  Assessment & Plan:  1. Essential hypertension Blood pressure is slightly above goal on current medication regimen. The patient is asked to make an attempt to improve diet and exercise patterns to aid in medical management of this problem. - Basic Metabolic Panel  2. Severe obesity (BMI >= 40) (HCC) Recommend a lowfat, low carbohydrate diet divided  over 5-6 small meals, increase water intake to 6-8 glasses, and 150 minutes per week of cardiovascular exercise.    3. Diastolic congestive heart failure, unspecified congestive heart failure chronicity (HCC) - furosemide (LASIX) 40 MG tablet; Take 1 tablet (40 mg total) by mouth daily.  Dispense: 30 tablet; Refill: 5  4. Acute diastolic congestive heart failure (HCC) - furosemide (LASIX) 40 MG tablet; Take 1 tablet (40 mg total) by mouth daily.  Dispense: 30 tablet; Refill: 5  5. Lower extremity edema - furosemide (LASIX) 40 MG tablet; Take 1 tablet (40 mg total) by mouth daily.  Dispense: 30 tablet; Refill: 5   Preventative maintenance: Pap smear: 2 months Mammogram: Referral for mammogram     RTC: 2 months for hypertension and pap smear   Dorena Dew, FNP

## 2016-06-27 NOTE — Patient Instructions (Addendum)
Hypertension Hypertension is another name for high blood pressure. High blood pressure forces your heart to work harder to pump blood. A blood pressure reading has two numbers, which includes a higher number over a lower number (example: 110/72). Follow these instructions at home:  Have your blood pressure rechecked by your doctor.  Only take medicine as told by your doctor. Follow the directions carefully. The medicine does not work as well if you skip doses. Skipping doses also puts you at risk for problems.  Do not smoke.  Monitor your blood pressure at home as told by your doctor. Contact a doctor if:  You think you are having a reaction to the medicine you are taking.  You have repeat headaches or feel dizzy.  You have puffiness (swelling) in your ankles.  You have trouble with your vision. Get help right away if:  You get a very bad headache and are confused.  You feel weak, numb, or faint.  You get chest or belly (abdominal) pain.  You throw up (vomit).  You cannot breathe very well. This information is not intended to replace advice given to you by your health care provider. Make sure you discuss any questions you have with your health care provider. Document Released: 11/09/2007 Document Revised: 10/29/2015 Document Reviewed: 03/15/2013 Elsevier Interactive Patient Education  2017 [employer]  Obesity, Adult Introduction Obesity is having too much body fat. If you have a BMI of 30 or more, you are obese. BMI is a number that explains how much body fat you have. Obesity is often caused by taking in (consuming) more calories than your body uses. Obesity can cause serious health problems. Changing your lifestyle can help to treat obesity. Follow these instructions at home: Eating and drinking  Follow advice from your doctor about what to eat and drink. Your doctor may tell you to:  Cut down on (limit) fast foods, sweets, and processed snack foods.  Choose  low-fat options. For example, choose low-fat milk instead of whole milk.  Eat 5 or more servings of fruits or vegetables every day.  Eat at home more often. This gives you more control over what you eat.  Choose healthy foods when you eat out.  Learn what a healthy portion size is. A portion size is the amount of a certain food that is healthy for you to eat at one time. This is different for each person.  Keep low-fat snacks available.  Avoid sugary drinks. These include soda, fruit juice, iced tea that is sweetened with sugar, and flavored milk.  Eat a healthy breakfast.  Drink enough water to keep your pee (urine) clear or pale yellow.  Do not go without eating for long periods of time (do not fast).  Do not go on popular or trendy diets (fad diets). Physical Activity  Exercise often, as told by your doctor. Ask your doctor:  What types of exercise are safe for you.  How often you should exercise.  Warm up and stretch before being active.  Do slow stretching after being active (cool down).  Rest between times of being active. Lifestyle  Limit how much time you spend in front of your TV, computer, or video game system (be less sedentary).  Find ways to reward yourself that do not involve food.  Limit alcohol intake to no more than 1 drink a day for nonpregnant women and 2 drinks a day for men. One drink equals 12 oz of beer, 5 oz of wine, or 1  oz of hard liquor. General instructions  Keep a weight loss journal. This can help you keep track of:  The food that you eat.  The exercise that you do.  Take over-the-counter and prescription medicines only as told by your doctor.  Take vitamins and supplements only as told by your doctor.  Think about joining a support group. Your doctor may be able to help with this.  Keep all follow-up visits as told by your doctor. This is important. Contact a doctor if:  You cannot meet your weight loss goal after you have  changed your diet and lifestyle for 6 weeks. This information is not intended to replace advice given to you by your health care provider. Make sure you discuss any questions you have with your health care provider. Document Released: 08/15/2011 Document Revised: 10/29/2015 Document Reviewed: 03/11/2015  2017 Elsevier

## 2016-07-06 ENCOUNTER — Other Ambulatory Visit: Payer: Self-pay

## 2016-07-06 MED ORDER — AMLODIPINE BESYLATE 10 MG PO TABS: 10.0000 mg | ORAL_TABLET | Freq: Every day | ORAL | 2 refills | Status: DC

## 2016-07-18 ENCOUNTER — Telehealth: Payer: Self-pay

## 2016-07-24 ENCOUNTER — Encounter (HOSPITAL_COMMUNITY): Payer: Self-pay | Admitting: Emergency Medicine

## 2016-07-24 DIAGNOSIS — Z79899 Other long term (current) drug therapy: Secondary | ICD-10-CM | POA: Diagnosis not present

## 2016-07-24 DIAGNOSIS — H9201 Otalgia, right ear: Secondary | ICD-10-CM | POA: Diagnosis not present

## 2016-07-24 DIAGNOSIS — R05 Cough: Secondary | ICD-10-CM | POA: Diagnosis present

## 2016-07-24 DIAGNOSIS — I509 Heart failure, unspecified: Secondary | ICD-10-CM | POA: Insufficient documentation

## 2016-07-24 DIAGNOSIS — I110 Hypertensive heart disease with heart failure: Secondary | ICD-10-CM | POA: Diagnosis not present

## 2016-07-24 DIAGNOSIS — J069 Acute upper respiratory infection, unspecified: Secondary | ICD-10-CM | POA: Diagnosis not present

## 2016-07-24 NOTE — ED Triage Notes (Signed)
Pt from home with complaints of cough x 8 months. Pt states she was diagnosed and admitted for CHF over the summer. Pt states she has had a cough ever since then. Pt has diminished lung sounds throughout. Pt states she also has followed up with her PCP regarding the cough and was told it is normal for CHF. Pt is not febrile nor tachycardic. Pt also has had right ear pain and sinus congestion for about 2 weeks. Pt states that most of the congestion has loosened up. Pt also is concerned she may have conjunctivitis in her right eye because when she has woken up the past few mornings, she has had discharge on the outside of her eyelid. Pt denies pain in her eye and there is no redness present.

## 2016-07-25 ENCOUNTER — Emergency Department (HOSPITAL_COMMUNITY): Payer: BLUE CROSS/BLUE SHIELD

## 2016-07-25 ENCOUNTER — Emergency Department (HOSPITAL_COMMUNITY)
Admission: EM | Admit: 2016-07-25 | Discharge: 2016-07-25 | Disposition: A | Discharge status: Home / Self Care | Payer: BLUE CROSS/BLUE SHIELD | Attending: Emergency Medicine | Admitting: Emergency Medicine

## 2016-07-25 DIAGNOSIS — R05 Cough: Secondary | ICD-10-CM

## 2016-07-25 DIAGNOSIS — H9201 Otalgia, right ear: Secondary | ICD-10-CM

## 2016-07-25 DIAGNOSIS — R059 Cough, unspecified: Secondary | ICD-10-CM

## 2016-07-25 DIAGNOSIS — J069 Acute upper respiratory infection, unspecified: Secondary | ICD-10-CM

## 2016-07-25 HISTORY — DX: Heart failure, unspecified: I50.9

## 2016-07-25 HISTORY — DX: Essential (primary) hypertension: I10

## 2016-07-25 MED ORDER — SALINE SPRAY 0.65 % NA SOLN: 1.0000 | NASAL | 0 refills | Status: DC | PRN

## 2016-07-25 MED ORDER — LORATADINE 10 MG PO TABS: 10.0000 mg | ORAL_TABLET | Freq: Every day | ORAL | 0 refills | Status: DC

## 2016-07-25 NOTE — Discharge Instructions (Signed)
You were seen today for cough and ear pain. Make sure to continue Flonase and start nasal saline to drain the middle ear. Ibuprofen as needed for pain. He will also be started on an allergy medication. You can have persistent cough with upper respiratory infection for 4-6 weeks. Follow-up with primary physician if you have any new or worsening symptoms.

## 2016-07-25 NOTE — ED Provider Notes (Signed)
[facility] DEPT Provider Note   CSN: [id number] Arrival date & time: 07/24/16  2325  By signing my name below, I, Neta Mends, attest that this documentation has been prepared under the direction and in the presence of Merryl Hacker, MD . Electronically Signed: Neta Mends, ED Scribe. 07/25/2016. 1:24 AM.   History   Chief Complaint Chief Complaint  Patient presents with  . Cough  . Otalgia    The history is provided by the patient. No language interpreter was used.   HPI Comments:  Jasmine Novak is a 46 y.o. female who presents to the Emergency Department complaining of a persistent, productive cough x 3 weeks. Pt states that she has had a similar cough recurrently for several months. This improved by stopping lisinopril. She states that her current cough is productive with clear sputum, and she complains of associated congestion, yellow/brown rhinorrhea, right ear pain, and leg swelling. Pt rates her ear pain at 10/10 at its worst. 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 Pt has an appointment with her PCP in 1 month. Pt has taken Flonase with no relief. Pt denies fever.   Past Medical History:  Diagnosis Date  . CHF (congestive heart failure) ([place])   . 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 Hypertension     Patient Active Problem List   Diagnosis Date Noted  . Severe obesity (BMI >= 40) ([place]) 06/08/2016  . Persistent cough for 3 weeks or longer 06/08/2016  . Essential hypertension 12/21/2015  . CHF (congestive heart failure) ([place]) 11/13/2015    Past Surgical History:  Procedure Laterality Date  . [place]   bilateral wrist    OB History    Gravida Para Term Preterm AB Living   2 2 2  0 0 2   SAB TAB Ectopic Multiple Live Births   0 0 0 0 1       Home Medications    Prior to Admission medications   Medication Sig Start Date End Date Taking? Authorizing Provider    amLODipine (NORVASC) 10 MG tablet Take 1 tablet (10 mg total) by mouth daily. 07/06/16  Yes Micheline Chapman, NP  carvedilol (COREG) 6.25 MG tablet Take 1 tablet (6.25 mg total) by mouth 2 (two) times daily with a meal. 06/08/16  Yes Dorena Dew, FNP  fluticasone (FLONASE) 50 MCG/ACT nasal spray Place 2 sprays into both nostrils daily. 11/15/15  Yes Bonnell Public, MD  furosemide (LASIX) 40 MG tablet Take 1 tablet (40 mg total) by mouth daily. 06/27/16  Yes Dorena Dew, FNP  metFORMIN (GLUCOPHAGE) 500 MG tablet Take 1 tablet (500 mg total) by mouth daily with breakfast. 06/08/16  Yes Dorena Dew, FNP  Promethazine HCl 6.25 MG/5ML SOLN Take 5 mLs (6.25 mg total) by mouth every 6 (six) hours as needed. 06/08/16 07/25/16 Yes Dorena Dew, FNP  loratadine (CLARITIN) 10 MG tablet Take 1 tablet (10 mg total) by mouth daily. 07/25/16   Merryl Hacker, MD  sodium chloride (OCEAN) 0.65 % SOLN nasal spray Place 1 spray into both nostrils as needed for congestion. 07/25/16   Merryl Hacker, MD    Family History Family History  Problem Relation Age of Onset  . Stroke Father   . Heart disease Daughter   . Diabetes Maternal Grandmother     Social History Social History  Substance Use Topics  .  Smoking status: Never Smoker  . Smokeless tobacco: Never Used  . Alcohol use No     Comment: wine once a month     Allergies   Lisinopril   Review of Systems Review of Systems  Constitutional: Negative for fever.  HENT: Positive for congestion, ear pain, rhinorrhea and trouble swallowing.   Respiratory: Positive for cough.   Cardiovascular: Positive for leg swelling. Negative for chest pain.  Gastrointestinal: Negative for abdominal pain, nausea and vomiting.  All other systems reviewed and are negative.    Physical Exam Updated Vital Signs BP 194/80 (BP Location: Left Wrist)   Pulse 88   Temp 98.2 F (36.8 C) (Oral)   Resp 14   Ht 5\' 6"  (1.676 m)   Wt (!) 310 lb (140.6  kg)   LMP 07/15/2016   SpO2 98%   BMI 50.04 kg/m   Physical Exam  Constitutional: She is oriented to person, place, and time. She appears well-developed and well-nourished.  Obese  HENT:  Head: Normocephalic and atraumatic.  Mouth/Throat: Oropharynx is clear and moist.  Small effusion right ear, intact light reflex, no erythema Nasal congestion noted  Eyes: Pupils are equal, round, and reactive to light.  Neck: Neck supple.  Cardiovascular: Normal rate, regular rhythm and normal heart sounds.   No murmur heard. Pulmonary/Chest: Effort normal and breath sounds normal. No respiratory distress. She has no wheezes.  Abdominal: Soft. Bowel sounds are normal. There is no tenderness.  Musculoskeletal: She exhibits edema.  Neurological: She is alert and oriented to person, place, and time.  Skin: Skin is warm and dry.  Psychiatric: She has a normal mood and affect.  Nursing note and vitals reviewed.    ED Treatments / Results  DIAGNOSTIC STUDIES:  Oxygen Saturation is 98% on RA, normal by my interpretation.    COORDINATION OF CARE:  1:23 AM Discussed treatment plan with pt at bedside and pt agreed to plan.   Labs (all labs ordered are listed, but only abnormal results are displayed) Labs Reviewed - No data to display  EKG  EKG Interpretation None       Radiology Dg Chest 2 View  Result Date: 07/25/2016 CLINICAL DATA:  Initial evaluation for acute cough, CHF. EXAM: CHEST  2 VIEW COMPARISON:  Prior radiograph from 06/08/2016. FINDINGS: Stable cardiomegaly.  Mediastinal silhouette within normal limits. Lungs normally inflated. Mild perihilar vascular congestion with interstitial prominence without overt pulmonary edema. No pleural effusion. No focal infiltrates. No pneumothorax. No acute osseus abnormality. IMPRESSION: Stable cardiomegaly with mild perihilar vascular congestion without overt pulmonary edema. Electronically Signed   By: Jeannine Boga M.D.   On:  07/25/2016 00:54    Procedures Procedures (including critical care time)  Medications Ordered in ED Medications - No data to display   Initial Impression / Assessment and Plan / ED Course  I have reviewed the triage vital signs and the nursing notes.  Pertinent labs & imaging results that were available during my care of the patient were reviewed by me and considered in my medical decision making (see chart for details).     Patient presents with persistent upper respiratory symptoms. She is nontoxic. Vital signs notable for blood pressure of 194/80. Suspect viral etiology. Chest x-ray without evidence of pneumonia. It is stable cardiomegaly. History of CHF. No overt volume overload. Discussed with patient supportive measures including nasal saline, continued Flonase, Claritin. No indication at this time for antibiotics or an inhaler. Follow-up with primary physician.  After history, exam,  and medical workup I feel the patient has been appropriately medically screened and is safe for discharge home. Pertinent diagnoses were discussed with the patient. Patient was given return precautions.   Final Clinical Impressions(s) / ED Diagnoses   Final diagnoses:  Viral upper respiratory tract infection  Cough  Right ear pain    New Prescriptions New Prescriptions   LORATADINE (CLARITIN) 10 MG TABLET    Take 1 tablet (10 mg total) by mouth daily.   SODIUM CHLORIDE (OCEAN) 0.65 % SOLN NASAL SPRAY    Place 1 spray into both nostrils as needed for congestion.   I personally performed the services described in this documentation, which was scribed in my presence. The recorded information has been reviewed and is accurate.     Merryl Hacker, MD 07/25/16 [phone]

## 2016-08-25 ENCOUNTER — Ambulatory Visit (INDEPENDENT_AMBULATORY_CARE_PROVIDER_SITE_OTHER): Payer: BLUE CROSS/BLUE SHIELD | Admitting: Family Medicine

## 2016-08-25 ENCOUNTER — Encounter: Payer: Self-pay | Admitting: Family Medicine

## 2016-08-25 VITALS — BP 126/70 | HR 83 | Temp 98.2°F | Resp 16 | Ht 66.0 in | Wt 307.0 lb

## 2016-08-25 DIAGNOSIS — M545 Low back pain, unspecified: Secondary | ICD-10-CM

## 2016-08-25 DIAGNOSIS — Z114 Encounter for screening for human immunodeficiency virus [HIV]: Secondary | ICD-10-CM

## 2016-08-25 DIAGNOSIS — R7303 Prediabetes: Secondary | ICD-10-CM

## 2016-08-25 DIAGNOSIS — I10 Essential (primary) hypertension: Secondary | ICD-10-CM | POA: Diagnosis not present

## 2016-08-25 DIAGNOSIS — Z23 Encounter for immunization: Secondary | ICD-10-CM | POA: Diagnosis not present

## 2016-08-25 DIAGNOSIS — R82998 Other abnormal findings in urine: Secondary | ICD-10-CM

## 2016-08-25 DIAGNOSIS — R8299 Other abnormal findings in urine: Secondary | ICD-10-CM

## 2016-08-25 LAB — POCT URINALYSIS DIP (DEVICE)
BILIRUBIN URINE: NEGATIVE
GLUCOSE, UA: NEGATIVE mg/dL
HGB URINE DIPSTICK: NEGATIVE
KETONES UR: NEGATIVE mg/dL
Nitrite: NEGATIVE
Protein, ur: NEGATIVE mg/dL
Specific Gravity, Urine: 1.015 (ref 1.005–1.030)
UROBILINOGEN UA: 1 mg/dL (ref 0.0–1.0)
pH: 7 (ref 5.0–8.0)

## 2016-08-25 LAB — POCT GLYCOSYLATED HEMOGLOBIN (HGB A1C): Hemoglobin A1C: 6.1

## 2016-08-25 NOTE — Patient Instructions (Addendum)
Recommend Tylenol 500 mg every 6 hours as needed.  Recommend a lowfat, low carbohydrate diet divided over 5-6 small meals, increase water intake to 6-8 glasses, and 150 minutes per week of cardiovascular exercise.

## 2016-08-25 NOTE — Progress Notes (Signed)
Subjective:    Patient ID: Jasmine Novak, female    DOB: 07/30/1970, 46 y.o.   MRN: [id number]  Hypertension  This is a chronic problem. The current episode started more than 1 year ago. The problem has been gradually improving since onset. The problem is controlled. Pertinent negatives include no anxiety, blurred vision, chest pain, headaches, malaise/fatigue, neck pain, orthopnea, palpitations, peripheral edema, PND or shortness of breath. Risk factors for coronary artery disease include diabetes mellitus, obesity and sedentary lifestyle. Past treatments include diuretics and calcium channel blockers. The current treatment provides moderate improvement. Compliance problems include diet.  Hypertensive end-organ damage includes heart failure. There is no history of CVA, PVD or retinopathy.  Diabetes  Diabetes visit type: Patient presents for a follow up of prediabetes. She was started on Metformin 3 months ago. She has not been consistent with medication regimen.  She has type 2 diabetes mellitus. Her disease course has been stable. Pertinent negatives for hypoglycemia include no dizziness, headaches or seizures. Associated symptoms include fatigue. Pertinent negatives for diabetes include no blurred vision, no chest pain, no foot ulcerations, no polydipsia, no polyphagia, no polyuria, no visual change, no weakness and no weight loss. Pertinent negatives for diabetic complications include no autonomic neuropathy, CVA, heart disease, impotence, nephropathy, peripheral neuropathy, PVD or retinopathy. Risk factors for coronary artery disease include diabetes mellitus and obesity. Current diabetic treatment includes oral agent (monotherapy). She is compliant with treatment some of the time. She is following a high fat/cholesterol diet. She has had a previous visit with a dietitian.     Past Medical History:  Diagnosis Date  . CHF (congestive heart failure) ([place])   . Herpes simplex    states hasn't had  outbreak since she was "young"  . Hypertension    Social History   Social History  . Marital status: Single    Spouse name: N/A  . Number of children: N/A  . Years of education: N/A   Occupational History  . Not on file.   Social History Main Topics  . Smoking status: Never Smoker  . Smokeless tobacco: Never Used  . Alcohol use No     Comment: wine once a month  . Drug use: No  . Sexual activity: Yes    Birth control/ protection: Condom   Other Topics Concern  . Not on file   Social History Narrative  . No narrative on file   Immunization History  Administered Date(s) Administered  . Tdap 08/25/2016     Review of Systems  Constitutional: Positive for fatigue and unexpected weight change (weight gain). Negative for fever, malaise/fatigue and weight loss.  HENT: Negative.   Eyes: Negative.  Negative for blurred vision.  Respiratory: Positive for cough. Negative for choking, chest tightness and shortness of breath.   Cardiovascular: Negative for chest pain, palpitations, orthopnea and PND.       Periodic swelling to ankles  Gastrointestinal: Negative.   Endocrine: Negative for polydipsia, polyphagia and polyuria.  Genitourinary: Negative.  Negative for impotence.  Musculoskeletal: Positive for back pain. Negative for neck pain.  Skin: Negative.   Neurological: Negative.  Negative for dizziness, seizures, weakness, light-headedness and headaches.  Hematological: Negative.   Psychiatric/Behavioral: Negative.        Objective:   Physical Exam  Constitutional: She is oriented to person, place, and time. She appears well-developed. No distress.  Morbid obesity  HENT:  Head: Normocephalic and atraumatic.  Right Ear: External ear normal.  Left Ear: External ear  normal.  Nose: Nose normal.  Mouth/Throat: Oropharynx is clear and moist.  Eyes: Conjunctivae and EOM are normal. Pupils are equal, round, and reactive to light.  Neck: Normal range of motion. Neck supple.   Cardiovascular: Normal rate, regular rhythm, normal heart sounds and intact distal pulses.  Exam reveals no gallop and no friction rub.   No murmur heard. Bilateral lower extremity edema (trace)  Abdominal:  Pendulous abdomen  Musculoskeletal: Normal range of motion.       Lumbar back: She exhibits normal range of motion, no tenderness, no pain and no spasm.  Neurological: She is alert and oriented to person, place, and time. She has normal strength and normal reflexes. She displays a negative Romberg sign.  Skin: Skin is warm and dry. She is not diaphoretic.  Psychiatric: She has a normal mood and affect. Her behavior is normal. Judgment and thought content normal.      BP 126/70 (BP Location: Left Arm, Patient Position: Sitting, Cuff Size: Large)   Pulse 83   Temp 98.2 F (36.8 C) (Oral)   Resp 16   Ht 5\' 6"  (1.676 m)   Wt (!) 307 lb (139.3 kg)   LMP 08/16/2016   SpO2 100%   BMI 49.55 kg/m  Assessment & Plan:  1. Essential hypertension Blood pressure is at goal on current medication regimen. I will not make any changes to medications on today.  Continue medication, monitor blood pressure at home. Continue DASH diet. Reminder to go to the ER if any CP, SOB, nausea, dizziness, severe HA, changes vision/speech, left arm numbness and tingling and jaw pain.  2. Prediabetes Patient has not been consistent with Metformin or a carbohydrate modified diet and exercise plan. Will continue Metformin as prescribed. Recommend a lowfat, low carbohydrate diet divided over 5-6 small meals, increase water intake to 6-8 glasses, and 150 minutes per week of cardiovascular exercise.   - HgB A1c  3. Need for Tdap vaccination - Tdap vaccine greater than or equal to 7yo IM  4. Acute low back pain without sciatica, unspecified back pain laterality [name] states that she has been sleeping in a recliner in her grandson's hospital room. She has been experiencing back pain over the past week. Patient  was advised to do back stretches and take Tylenol 500 mg OTC as directed. Refrain from using non steroidal anti inflammatory medication due to history of hypertension.   5. Screening for HIV (human immunodeficiency virus) - HIV antibody (with reflex)  6. Urine leukocytes - Urine culture  Preventative Health Maintenance Patient will need a pap smear   RTC: 3 months for hypertension and prediabetes   Donia Pounds  MSN, FNP-C [facility] [place], [place] [address] [phone]   The patient was given clear instructions to go to ER or return to medical center if symptoms do not improve, worsen or new problems develop. The patient verbalized understanding. Will notify patient with laboratory results.

## 2016-08-26 LAB — URINE CULTURE: ORGANISM ID, BACTERIA: NO GROWTH

## 2016-08-26 LAB — HIV ANTIBODY (ROUTINE TESTING W REFLEX): HIV: NONREACTIVE

## 2016-10-07 ENCOUNTER — Other Ambulatory Visit: Payer: Self-pay | Admitting: Family Medicine

## 2016-10-07 DIAGNOSIS — I5031 Acute diastolic (congestive) heart failure: Secondary | ICD-10-CM

## 2016-11-20 ENCOUNTER — Other Ambulatory Visit: Payer: Self-pay | Admitting: Family Medicine

## 2016-11-20 DIAGNOSIS — I5031 Acute diastolic (congestive) heart failure: Secondary | ICD-10-CM

## 2016-11-21 ENCOUNTER — Other Ambulatory Visit: Payer: Self-pay

## 2016-11-21 MED ORDER — AMLODIPINE BESYLATE 10 MG PO TABS: 10.0000 mg | ORAL_TABLET | Freq: Every day | ORAL | 2 refills | Status: DC

## 2016-11-21 NOTE — Telephone Encounter (Signed)
Refill for amlodipine sent into pharmacy. Thanks!  

## 2016-11-25 ENCOUNTER — Ambulatory Visit: Payer: BLUE CROSS/BLUE SHIELD | Admitting: Family Medicine

## 2016-12-21 ENCOUNTER — Other Ambulatory Visit: Payer: Self-pay | Admitting: Family Medicine

## 2016-12-21 DIAGNOSIS — I5031 Acute diastolic (congestive) heart failure: Secondary | ICD-10-CM

## 2016-12-22 ENCOUNTER — Other Ambulatory Visit: Payer: Self-pay | Admitting: Family Medicine

## 2016-12-22 DIAGNOSIS — I5031 Acute diastolic (congestive) heart failure: Secondary | ICD-10-CM

## 2016-12-22 DIAGNOSIS — I5030 Unspecified diastolic (congestive) heart failure: Secondary | ICD-10-CM

## 2016-12-22 DIAGNOSIS — R600 Localized edema: Secondary | ICD-10-CM

## 2017-02-03 ENCOUNTER — Other Ambulatory Visit: Payer: Self-pay | Admitting: Family Medicine

## 2017-02-04 ENCOUNTER — Encounter (HOSPITAL_COMMUNITY): Payer: Self-pay | Admitting: *Deleted

## 2017-02-04 ENCOUNTER — Emergency Department (HOSPITAL_COMMUNITY)
Admission: EM | Admit: 2017-02-04 | Discharge: 2017-02-04 | Disposition: A | Discharge status: Home / Self Care | Payer: Self-pay | Attending: Emergency Medicine | Admitting: Emergency Medicine

## 2017-02-04 ENCOUNTER — Emergency Department (HOSPITAL_COMMUNITY): Payer: Self-pay

## 2017-02-04 DIAGNOSIS — Z79899 Other long term (current) drug therapy: Secondary | ICD-10-CM | POA: Insufficient documentation

## 2017-02-04 DIAGNOSIS — M25511 Pain in right shoulder: Secondary | ICD-10-CM | POA: Insufficient documentation

## 2017-02-04 DIAGNOSIS — Z7984 Long term (current) use of oral hypoglycemic drugs: Secondary | ICD-10-CM | POA: Insufficient documentation

## 2017-02-04 DIAGNOSIS — I509 Heart failure, unspecified: Secondary | ICD-10-CM | POA: Insufficient documentation

## 2017-02-04 DIAGNOSIS — I110 Hypertensive heart disease with heart failure: Secondary | ICD-10-CM | POA: Insufficient documentation

## 2017-02-04 MED ORDER — CYCLOBENZAPRINE HCL 10 MG PO TABS: 10.0000 mg | ORAL_TABLET | Freq: Two times a day (BID) | ORAL | 0 refills | Status: DC | PRN

## 2017-02-04 NOTE — ED Provider Notes (Signed)
[facility] DEPT Provider Note   CSN: [id number] Arrival date & time: 02/04/17  1502     History   Chief Complaint Chief Complaint  Patient presents with  . Shoulder Pain    HPI Jasmine Novak is a 46 y.o. female who presents to the ED with right shoulder pain that radiates to the right elbow. The pain started a month ago and then went away. Last week the pain returned and has continued. The pain increases with movig the right arm to reach across her body. Patient has been taking hydrocodone and Penicillin for a bad tooth but it hasn't helped the shoulder.    Shoulder Pain   This is a new problem. The current episode started more than 1 week ago. The problem occurs constantly. The problem has been gradually worsening. The pain is present in the right shoulder. The pain is moderate.    Past Medical History:  Diagnosis Date  . CHF (congestive heart failure) ([place])   . 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 Hypertension     Patient Active Problem List   Diagnosis Date Noted  . Severe obesity (BMI >= 40) ([place]) 06/08/2016  . Persistent cough for 3 weeks or longer 06/08/2016  . Essential hypertension 12/21/2015  . CHF (congestive heart failure) ([place]) 11/13/2015    Past Surgical History:  Procedure Laterality Date  . [place]   bilateral wrist    OB History    Gravida Para Term Preterm AB Living   2 2 2  0 0 2   SAB TAB Ectopic Multiple Live Births   0 0 0 0 1       Home Medications    Prior to Admission medications   Medication Sig Start Date End Date Taking? Authorizing Provider  amLODipine (NORVASC) 10 MG tablet Take 1 tablet (10 mg total) by mouth daily. 11/21/16   Dorena Dew, FNP  carvedilol (COREG) 6.25 MG tablet TAKE 1 TABLET(6.25 MG) BY MOUTH TWICE DAILY WITH A MEAL 12/21/16   Dorena Dew, FNP  cyclobenzaprine (FLEXERIL) 10 MG tablet Take 1 tablet (10 mg total) by mouth 2 (two) times daily  as needed for muscle spasms. 02/04/17   Ashley Murrain, NP  fluticasone (FLONASE) 50 MCG/ACT nasal spray Place 2 sprays into both nostrils daily. 11/15/15   Dana Allan I, MD  furosemide (LASIX) 40 MG tablet TAKE 1 TABLET(40 MG) BY MOUTH DAILY 12/23/16   Dorena Dew, FNP  metFORMIN (GLUCOPHAGE) 500 MG tablet Take 1 tablet (500 mg total) by mouth daily with breakfast. 06/08/16   Dorena Dew, FNP    Family History Family History  Problem Relation Age of Onset  . Stroke Father   . Heart disease Daughter   . Diabetes Maternal Grandmother     Social History Social History  Substance Use Topics  . Smoking status: Never Smoker  . Smokeless tobacco: Never Used  . Alcohol use No     Comment: wine once a month     Allergies   Lisinopril   Review of Systems Review of Systems  Constitutional: Negative for chills and fever.  HENT: Positive for dental problem.   Gastrointestinal: Negative for abdominal pain, nausea and vomiting.  Musculoskeletal: Positive for arthralgias.       Right shoulder  Skin: Negative for wound.  Neurological: Negative for headaches.  Psychiatric/Behavioral: Negative for confusion.     Physical Exam  Updated Vital Signs BP (!) 161/94 (BP Location: Left Arm)   Pulse 66   Temp 98.2 F (36.8 C) (Oral)   Resp 16   LMP 12/24/2016   SpO2 98%   Physical Exam  Constitutional: She is oriented to person, place, and time. She appears well-developed and well-nourished. No distress.  HENT:  Head: Normocephalic and atraumatic.  Eyes: EOM are normal.  Neck: Neck supple.  Cardiovascular: Normal rate and regular rhythm.   Pulmonary/Chest: Effort normal and breath sounds normal.  Abdominal: Soft. There is no tenderness.  Musculoskeletal:       Right shoulder: She exhibits tenderness and spasm. She exhibits no swelling, no effusion, no crepitus, no deformity, no laceration, normal pulse and normal strength. Decreased range of motion: due to pain.    Tenderness with palpation to the anterior and posterior aspect of the right shoulder. Increased pain with range of motion. Radial pulses 2+, adequate circulation.  Neurological: She is alert and oriented to person, place, and time. No cranial nerve deficit.  Skin: Skin is warm and dry.  Psychiatric: She has a normal mood and affect. Her behavior is normal.  Nursing note and vitals reviewed.    ED Treatments / Results  Labs (all labs ordered are listed, but only abnormal results are displayed) Labs Reviewed - No data to display Radiology Dg Shoulder Right  Result Date: 02/04/2017 CLINICAL DATA:  Intermittent right shoulder pain for 1 month. No reported injury. EXAM: RIGHT SHOULDER - 2+ VIEW COMPARISON:  None. FINDINGS: No fracture. No right glenohumeral joint dislocation. No evidence of right acromioclavicular joint separation. No suspicious focal osseous lesion. 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 Small enthesophytes at the right acromion. No significant right glenohumeral joint arthropathy. No radiopaque foreign body. IMPRESSION: 1. No fracture or malalignment in the right shoulder . 2. Mild-to-moderate right AC joint osteoarthritis. 3. Small enthesophytes at the right acromion. Electronically Signed   By: Ilona Sorrel M.D.   On: 02/04/2017 16:50    Procedures Procedures (including critical care time)  Medications Ordered in ED Medications - No data to display   Initial Impression / Assessment and Plan / ED Course  I have reviewed the triage vital signs and the nursing notes.  46 y.o. female with right shoulder pain stable for d/c without focal neuro deficits. Discussed with the patient clinical and x-ray finding and plan of care. Patient to f/u with her PCP or ortho. Will give Rx for Flexeril.   Final Clinical Impressions(s) / ED Diagnoses   Final diagnoses:  Pain in right shoulder  Acute pain of right shoulder    New Prescriptions Discharge  Medication List as of 02/04/2017  6:15 PM    START taking these medications   Details  cyclobenzaprine (FLEXERIL) 10 MG tablet Take 1 tablet (10 mg total) by mouth 2 (two) times daily as needed for muscle spasms., Starting Sat 02/04/2017, Print         [place], [place], [place] 02/05/17 [place], MD 02/14/17 [phone]

## 2017-02-04 NOTE — ED Triage Notes (Signed)
Pt complains of right shoulder pain radiating to her elbow. Pt noticed the pain last month, states the pain went away, then came back last week. Pt is worse in shoulder when reaching across her body.

## 2017-02-04 NOTE — Discharge Instructions (Signed)
Follow up with your doctor. Return here for worsening symptoms.

## 2017-02-28 ENCOUNTER — Other Ambulatory Visit: Payer: Self-pay | Admitting: Family Medicine

## 2017-02-28 DIAGNOSIS — R600 Localized edema: Secondary | ICD-10-CM

## 2017-02-28 DIAGNOSIS — I5030 Unspecified diastolic (congestive) heart failure: Secondary | ICD-10-CM

## 2017-02-28 DIAGNOSIS — I5031 Acute diastolic (congestive) heart failure: Secondary | ICD-10-CM

## 2017-03-02 ENCOUNTER — Ambulatory Visit (INDEPENDENT_AMBULATORY_CARE_PROVIDER_SITE_OTHER): Payer: Self-pay | Admitting: Family Medicine

## 2017-03-02 ENCOUNTER — Encounter: Payer: Self-pay | Admitting: Family Medicine

## 2017-03-02 VITALS — BP 165/99 | HR 80 | Temp 98.1°F | Resp 14 | Ht 66.0 in | Wt 309.0 lb

## 2017-03-02 DIAGNOSIS — I5032 Chronic diastolic (congestive) heart failure: Secondary | ICD-10-CM

## 2017-03-02 DIAGNOSIS — I10 Essential (primary) hypertension: Secondary | ICD-10-CM

## 2017-03-02 DIAGNOSIS — R7303 Prediabetes: Secondary | ICD-10-CM

## 2017-03-02 LAB — POCT URINALYSIS DIP (DEVICE)
BILIRUBIN URINE: NEGATIVE
GLUCOSE, UA: NEGATIVE mg/dL
KETONES UR: NEGATIVE mg/dL
Nitrite: NEGATIVE
PROTEIN: NEGATIVE mg/dL
Specific Gravity, Urine: 1.015 (ref 1.005–1.030)
Urobilinogen, UA: 0.2 mg/dL (ref 0.0–1.0)
pH: 8.5 — ABNORMAL HIGH (ref 5.0–8.0)

## 2017-03-02 NOTE — Patient Instructions (Addendum)
Take medication consistently and return in 2 weeks for blood pressure recheck, 3 months for hypertension and diabetes recheck. Continue lasix and I recommend obtaining compression socks to help with lower leg swelling.    Hypertension Hypertension is another name for high blood pressure. High blood pressure forces your heart to work harder to pump blood. This can cause problems over time. There are two numbers in a blood pressure reading. There is a top number (systolic) over a bottom number (diastolic). It is best to have a blood pressure below 120/80. Healthy choices can help lower your blood pressure. You may need medicine to help lower your blood pressure if:  Your blood pressure cannot be lowered with healthy choices.  Your blood pressure is higher than 130/80.  Follow these instructions at home: Eating and drinking  If directed, follow the DASH eating plan. This diet includes: ? Filling half of your plate at each meal with fruits and vegetables. ? Filling one quarter of your plate at each meal with whole grains. Whole grains include whole wheat pasta, brown rice, and whole grain bread. ? Eating or drinking low-fat dairy products, such as skim milk or low-fat yogurt. ? Filling one quarter of your plate at each meal with low-fat (lean) proteins. Low-fat proteins include fish, skinless chicken, eggs, beans, and tofu. ? Avoiding fatty meat, cured and processed meat, or chicken with skin. ? Avoiding premade or processed food.  Eat less than 1,500 mg of salt (sodium) a day.  Limit alcohol use to no more than 1 drink a day for nonpregnant women and 2 drinks a day for men. One drink equals 12 oz of beer, 5 oz of wine, or 1 oz of hard liquor. Lifestyle  Work with your doctor to stay at a healthy weight or to lose weight. Ask your doctor what the best weight is for you.  Get at least 30 minutes of exercise that causes your heart to beat faster (aerobic exercise) most days of the week. This  may include walking, swimming, or biking.  Get at least 30 minutes of exercise that strengthens your muscles (resistance exercise) at least 3 days a week. This may include lifting weights or pilates.  Do not use any products that contain nicotine or tobacco. This includes cigarettes and e-cigarettes. If you need help quitting, ask your doctor.  Check your blood pressure at home as told by your doctor.  Keep all follow-up visits as told by your doctor. This is important. Medicines  Take over-the-counter and prescription medicines only as told by your doctor. Follow directions carefully.  Do not skip doses of blood pressure medicine. The medicine does not work as well if you skip doses. Skipping doses also puts you at risk for problems.  Ask your doctor about side effects or reactions to medicines that you should watch for. Contact a doctor if:  You think you are having a reaction to the medicine you are taking.  You have headaches that keep coming back (recurring).  You feel dizzy.  You have swelling in your ankles.  You have trouble with your vision. Get help right away if:  You get a very bad headache.  You start to feel confused.  You feel weak or numb.  You feel faint.  You get very bad pain in your: ? Chest. ? Belly (abdomen).  You throw up (vomit) more than once.  You have trouble breathing. Summary  Hypertension is another name for high blood pressure.  Making healthy choices  can help lower blood pressure. If your blood pressure cannot be controlled with healthy choices, you may need to take medicine. This information is not intended to replace advice given to you by your health care provider. Make sure you discuss any questions you have with your health care provider. Document Released: 11/09/2007 Document Revised: 04/20/2016 Document Reviewed: 04/20/2016 Elsevier Interactive Patient Education  [employer].

## 2017-03-02 NOTE — Progress Notes (Signed)
Patient ID: Jasmine Novak, female    DOB: [birth year], 46 y.o.   MRN: [id number]  PCP: Dorena Dew, FNP  Chief Complaint  Patient presents with  . Follow-up    3 month    Subjective:  HPI Jasmine Novak is a 46 y.o. female presents for 3 month chronic disease management follow-up. Medical history significant for prediabetes, hypertension, CHF, and morbid obesity. Charnay presents today with accelerated hypertension on arrival. See vitals. She admits to not taking her prescribed medication today prior to visit. She inconsistently monitor BP at home and is unable to provide average readings today. She is suffers from heart failure. Last echo 11/14/2015 which was significant for grade 2 diastolic failure with EF 40%-98%. Denies active dyspnea at rest, lower extremity swelling, chest pain, dizziness, or chronic fatigue. She is a non-smoker. Reports efforts to reduce sodium intake and increase physical activity as tolerated. Current Body mass index is 49.87 kg/m. She is prediabetic, last A1C  6.3. Reports inconsistently taking metformin due to GI side effects. Typically takes 1/2 tablet and is able to tolerate lower dose. She has reduced intake of fried foods and increased intake baked and fried foods. Social History   Social History  . Marital status: Single    Spouse name: N/A  . Number of children: N/A  . Years of education: N/A   Occupational History  . Not on file.   Social History Main Topics  . Smoking status: Never Smoker  . Smokeless tobacco: Never Used  . Alcohol use No     Comment: wine once a month  . Drug use: No  . Sexual activity: Yes    Birth control/ protection: Condom   Other Topics Concern  . Not on file   Social History Narrative  . No narrative on file    Family History  Problem Relation Age of Onset  . Stroke Father   . Heart disease Daughter   . Diabetes Maternal Grandmother    Review of Systems See history of presence illness  Patient Active  Problem List   Diagnosis Date Noted  . Severe obesity (BMI >= 40) ([place]) 06/08/2016  . Persistent cough for 3 weeks or longer 06/08/2016  . Essential hypertension 12/21/2015  . CHF (congestive heart failure) ([place]) 11/13/2015    Allergies  Allergen Reactions  . Lisinopril Cough    Prior to Admission medications   Medication Sig Start Date End Date Taking? Authorizing Provider  amLODipine (NORVASC) 10 MG tablet TAKE 1 TABLET(10 MG) BY MOUTH DAILY 02/07/17  Yes Dorena Dew, FNP  carvedilol (COREG) 6.25 MG tablet TAKE 1 TABLET(6.25 MG) BY MOUTH TWICE DAILY WITH A MEAL 12/21/16  Yes Dorena Dew, FNP  furosemide (LASIX) 40 MG tablet TAKE 1 TABLET(40 MG) BY MOUTH DAILY 03/01/17  Yes Dorena Dew, FNP  metFORMIN (GLUCOPHAGE) 500 MG tablet Take 1 tablet (500 mg total) by mouth daily with breakfast. 06/08/16  Yes Dorena Dew, FNP  amLODipine (NORVASC) 10 MG tablet TAKE 1 TABLET(10 MG) BY MOUTH DAILY Patient not taking: Reported on 03/02/2017 03/01/17   Dorena Dew, FNP  cyclobenzaprine (FLEXERIL) 10 MG tablet Take 1 tablet (10 mg total) by mouth 2 (two) times daily as needed for muscle spasms. Patient not taking: Reported on 03/02/2017 02/04/17   Ashley Murrain, NP  fluticasone [facility]) 50 MCG/ACT nasal spray Place 2 sprays into both nostrils daily. Patient not taking: Reported on 03/02/2017 11/15/15   Bonnell Public, MD  furosemide (LASIX) 40 MG tablet TAKE 1 TABLET(40 MG) BY MOUTH DAILY Patient not taking: Reported on 03/02/2017 12/23/16   Dorena Dew, FNP    Past Medical, Surgical Family and Social History reviewed and updated.    Objective:   Today's Vitals   03/02/17 1043  BP: (!) 165/99  Pulse: 80  Resp: 14  Temp: 98.1 F (36.7 C)  TempSrc: Oral  SpO2: 100%  Weight: (!) 309 lb (140.2 kg)  Height: 5\' 6"  (1.676 m)    Wt Readings from Last 3 Encounters:  03/02/17 (!) 309 lb (140.2 kg)  08/25/16 (!) 307 lb (139.3 kg)  07/24/16 (!) 310 lb (140.6 kg)    Physical Exam  Constitutional: She is oriented to person, place, and time. She appears well-developed and well-nourished.  HENT:  Head: Normocephalic and atraumatic.  Eyes: Pupils are equal, round, and reactive to light. Conjunctivae are normal.  Neck: Normal range of motion. Neck supple.  Cardiovascular: Normal rate, regular rhythm, normal heart sounds and intact distal pulses.  Exam reveals no gallop.   No murmur heard. Pulmonary/Chest: Effort normal and breath sounds normal.  Abdominal: Soft. Bowel sounds are normal. She exhibits no distension. There is no tenderness.  Musculoskeletal: Normal range of motion. She exhibits no edema.  Neurological: She is alert and oriented to person, place, and time.  Skin: Skin is warm and dry.  Psychiatric: She has a normal mood and affect. Her behavior is normal. Judgment and thought content normal.    Assessment & Plan:  1. Essential hypertension, uncontrolled. Administered clonidine 0.2 mg for BP control. BP reduced to 146/88. Encouraged to take medications as prescribed. Return in 2 weeks for BP recheck. 2. Prediabetes, improved A1C 6.0. Continue dietary modifications and increase physical activity. 3. Chronic diastolic heart failure ([place]), maintain good BP control. Continue carvedilol and furosemide.   RTC: 2 weeks for BP check and 3 months for chronic disease management.  Carroll Sage. Kenton Kingfisher, MSN, FNP-C [facility]  [address] [place], [place] [address] [phone]

## 2017-03-09 ENCOUNTER — Other Ambulatory Visit: Payer: Self-pay | Admitting: Family Medicine

## 2017-03-09 DIAGNOSIS — I5031 Acute diastolic (congestive) heart failure: Secondary | ICD-10-CM

## 2017-03-09 LAB — POCT GLYCOSYLATED HEMOGLOBIN (HGB A1C): HEMOGLOBIN A1C: 6

## 2017-03-15 ENCOUNTER — Ambulatory Visit: Payer: Self-pay

## 2017-03-15 DIAGNOSIS — I5031 Acute diastolic (congestive) heart failure: Secondary | ICD-10-CM

## 2017-03-15 MED ORDER — CARVEDILOL 6.25 MG PO TABS: ORAL_TABLET | ORAL | 3 refills | Status: DC

## 2017-03-15 MED ORDER — AMLODIPINE BESYLATE 10 MG PO TABS: 10.0000 mg | ORAL_TABLET | Freq: Every day | ORAL | 2 refills | Status: DC

## 2017-03-15 NOTE — Progress Notes (Signed)
Added refills to anithypertensive medications.

## 2017-04-10 ENCOUNTER — Other Ambulatory Visit: Payer: Self-pay | Admitting: Family Medicine

## 2017-04-10 DIAGNOSIS — I5031 Acute diastolic (congestive) heart failure: Secondary | ICD-10-CM

## 2017-04-10 DIAGNOSIS — I5030 Unspecified diastolic (congestive) heart failure: Secondary | ICD-10-CM

## 2017-04-10 DIAGNOSIS — R600 Localized edema: Secondary | ICD-10-CM

## 2017-05-14 ENCOUNTER — Other Ambulatory Visit: Payer: Self-pay | Admitting: Family Medicine

## 2017-05-14 DIAGNOSIS — R600 Localized edema: Secondary | ICD-10-CM

## 2017-05-14 DIAGNOSIS — I5030 Unspecified diastolic (congestive) heart failure: Secondary | ICD-10-CM

## 2017-05-14 DIAGNOSIS — I5031 Acute diastolic (congestive) heart failure: Secondary | ICD-10-CM

## 2017-06-05 ENCOUNTER — Other Ambulatory Visit: Payer: Self-pay | Admitting: Family Medicine

## 2017-06-05 DIAGNOSIS — I5031 Acute diastolic (congestive) heart failure: Secondary | ICD-10-CM

## 2017-06-07 IMAGING — CR DG CHEST 2V
2 series · 2 of 2 positions shown · non-contrast
Comparison: 01/07/2015

CLINICAL DATA: Shortness of breath

EXAM:
CHEST  2 VIEW

[w chest pa]
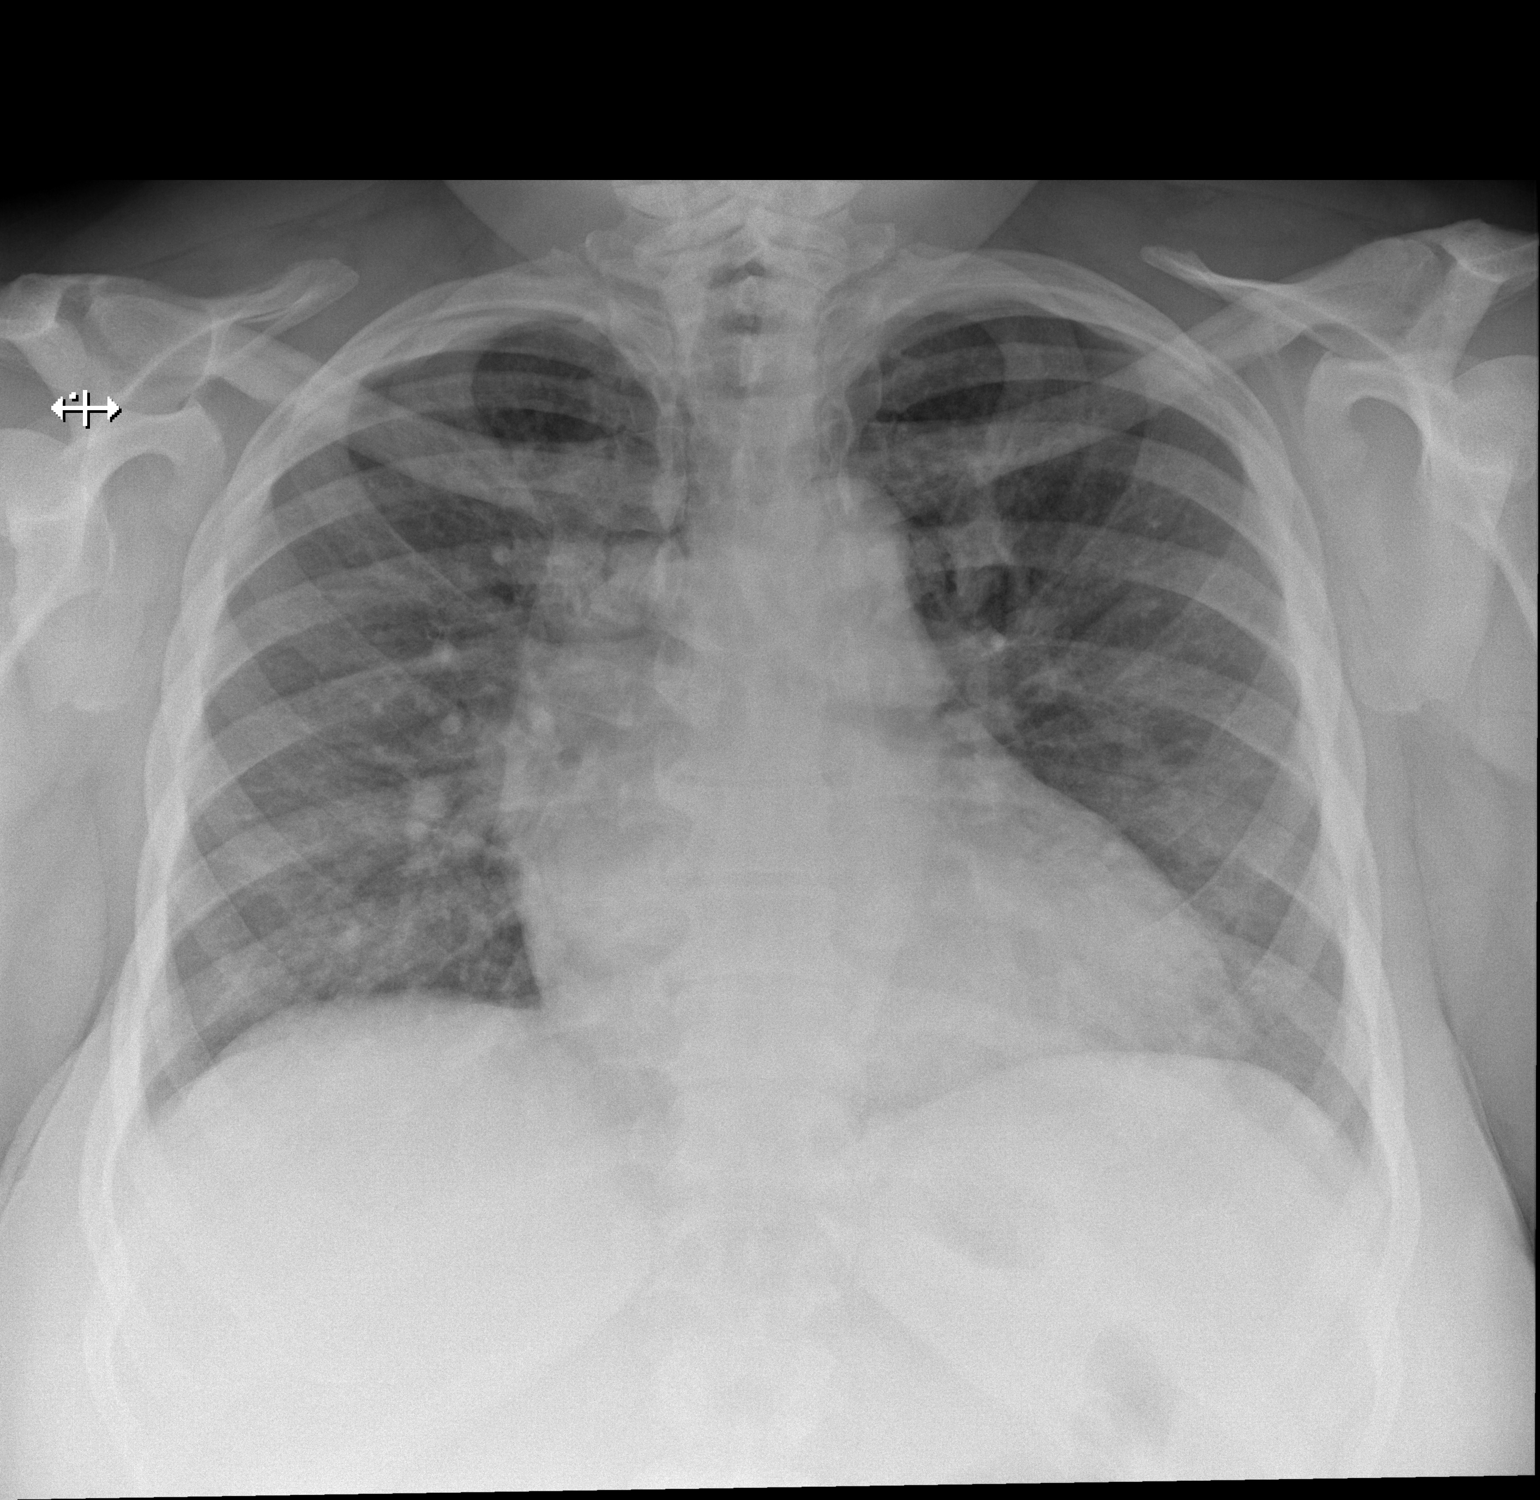

[w chest lat]
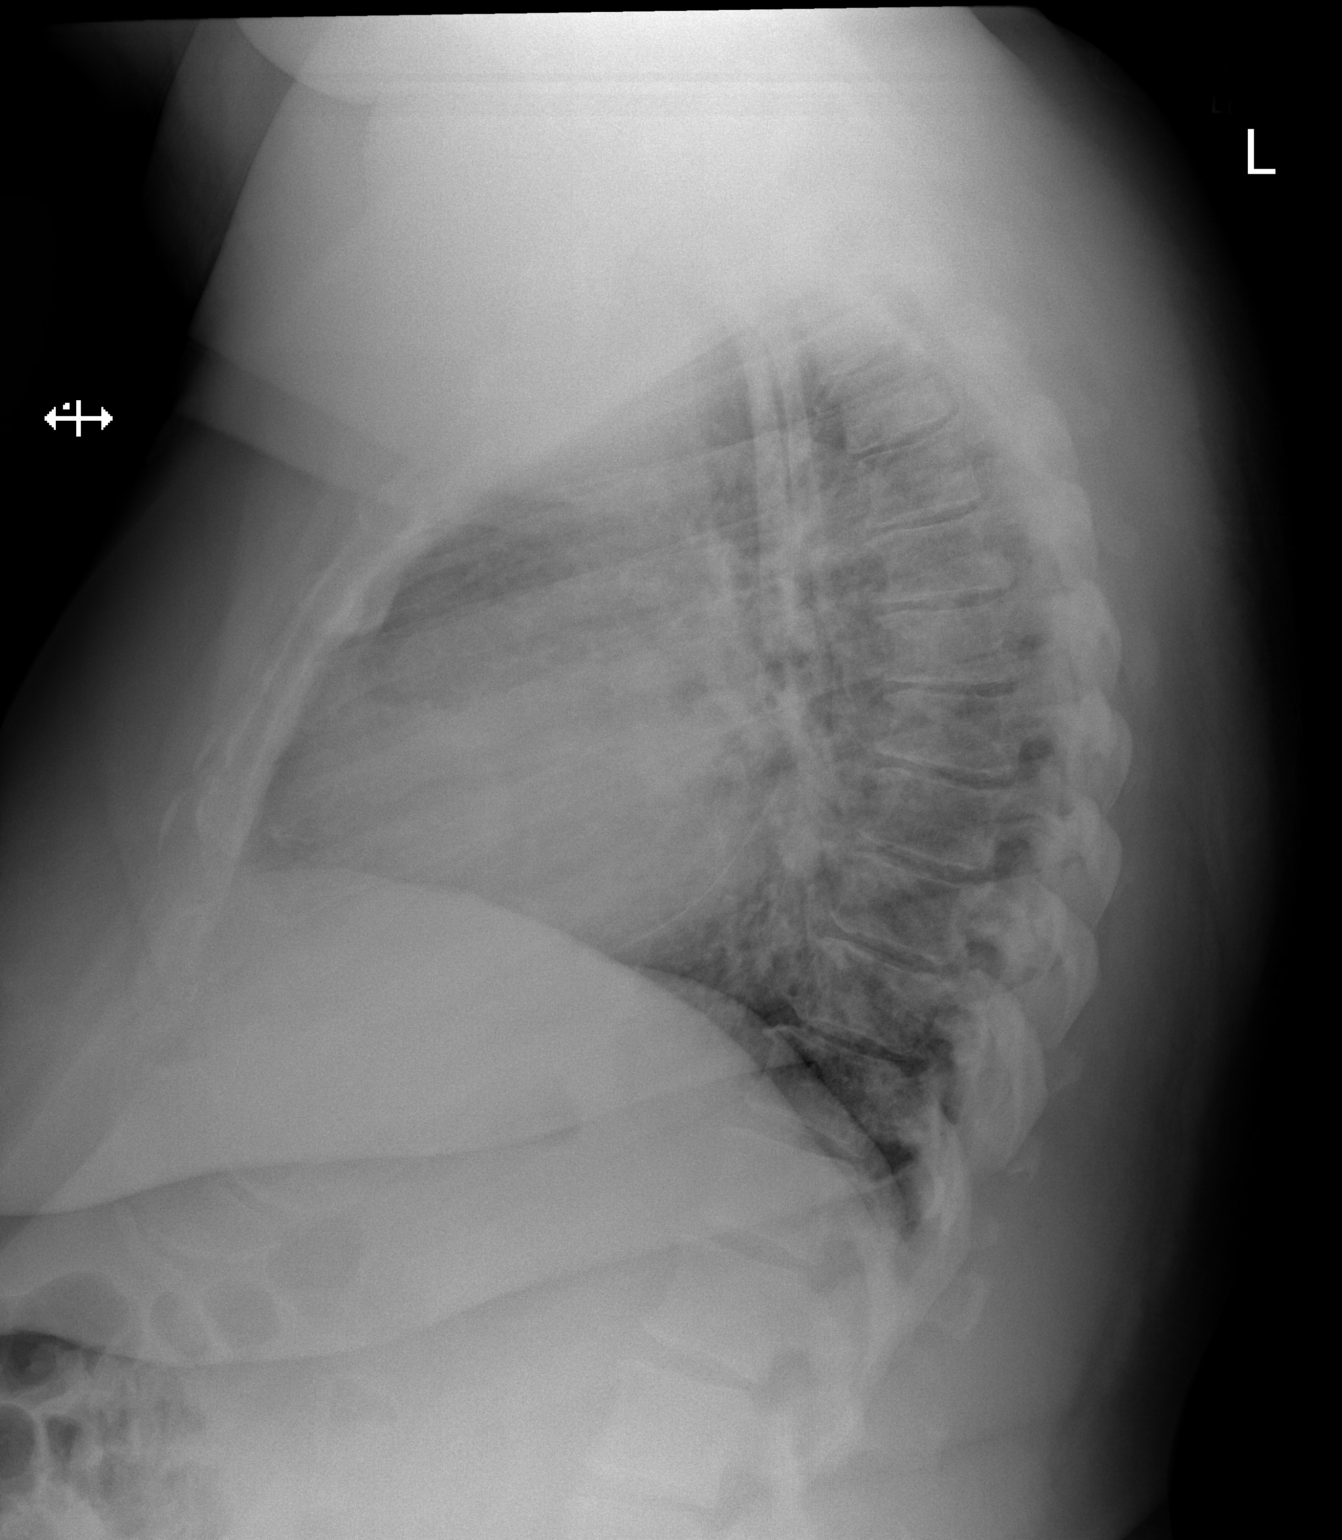

[2 of 2 positions shown; findings below may reference images not displayed]

FINDINGS: Cardiomegaly and vascular pedicle widening. Stable aortic
tortuosity. Pulmonary venous congestion accentuated by low volumes.
No interstitial edema or effusion. No focal pneumonia. No
pneumothorax.
IMPRESSION: Cardiomegaly and pulmonary venous congestion.

## 2017-06-08 ENCOUNTER — Ambulatory Visit: Payer: Self-pay | Admitting: Family Medicine

## 2017-06-28 ENCOUNTER — Other Ambulatory Visit: Payer: Self-pay | Admitting: Family Medicine

## 2017-06-28 DIAGNOSIS — I5031 Acute diastolic (congestive) heart failure: Secondary | ICD-10-CM

## 2017-06-28 DIAGNOSIS — R600 Localized edema: Secondary | ICD-10-CM

## 2017-06-28 DIAGNOSIS — I5030 Unspecified diastolic (congestive) heart failure: Secondary | ICD-10-CM

## 2017-07-25 ENCOUNTER — Other Ambulatory Visit: Payer: Self-pay | Admitting: Family Medicine

## 2017-07-25 DIAGNOSIS — I5031 Acute diastolic (congestive) heart failure: Secondary | ICD-10-CM

## 2017-08-19 ENCOUNTER — Other Ambulatory Visit: Payer: Self-pay | Admitting: Family Medicine

## 2017-08-19 DIAGNOSIS — I5031 Acute diastolic (congestive) heart failure: Secondary | ICD-10-CM

## 2017-08-19 DIAGNOSIS — R600 Localized edema: Secondary | ICD-10-CM

## 2017-08-19 DIAGNOSIS — I5030 Unspecified diastolic (congestive) heart failure: Secondary | ICD-10-CM

## 2017-08-29 ENCOUNTER — Other Ambulatory Visit: Payer: Self-pay | Admitting: Family Medicine

## 2017-08-29 DIAGNOSIS — I5031 Acute diastolic (congestive) heart failure: Secondary | ICD-10-CM

## 2017-09-24 ENCOUNTER — Other Ambulatory Visit: Payer: Self-pay | Admitting: Family Medicine

## 2017-09-24 DIAGNOSIS — R600 Localized edema: Secondary | ICD-10-CM

## 2017-09-24 DIAGNOSIS — I5031 Acute diastolic (congestive) heart failure: Secondary | ICD-10-CM

## 2017-09-24 DIAGNOSIS — I5030 Unspecified diastolic (congestive) heart failure: Secondary | ICD-10-CM

## 2017-11-28 ENCOUNTER — Other Ambulatory Visit: Payer: Self-pay | Admitting: Family Medicine

## 2017-11-28 DIAGNOSIS — R600 Localized edema: Secondary | ICD-10-CM

## 2017-11-28 DIAGNOSIS — I5030 Unspecified diastolic (congestive) heart failure: Secondary | ICD-10-CM

## 2017-11-28 DIAGNOSIS — I5031 Acute diastolic (congestive) heart failure: Secondary | ICD-10-CM

## 2017-12-22 ENCOUNTER — Other Ambulatory Visit: Payer: Self-pay | Admitting: Family Medicine

## 2017-12-22 ENCOUNTER — Other Ambulatory Visit: Payer: Self-pay

## 2017-12-22 DIAGNOSIS — I5031 Acute diastolic (congestive) heart failure: Secondary | ICD-10-CM

## 2017-12-22 MED ORDER — CARVEDILOL 6.25 MG PO TABS: ORAL_TABLET | ORAL | 0 refills | Status: DC

## 2018-01-28 ENCOUNTER — Other Ambulatory Visit: Payer: Self-pay | Admitting: Family Medicine

## 2018-01-28 DIAGNOSIS — R600 Localized edema: Secondary | ICD-10-CM

## 2018-01-28 DIAGNOSIS — I5031 Acute diastolic (congestive) heart failure: Secondary | ICD-10-CM

## 2018-01-28 DIAGNOSIS — I5030 Unspecified diastolic (congestive) heart failure: Secondary | ICD-10-CM

## 2018-04-07 ENCOUNTER — Other Ambulatory Visit: Payer: Self-pay | Admitting: Family Medicine

## 2018-04-09 NOTE — Telephone Encounter (Signed)
Patient requesting refill- please review. Patient seen by [facility].

## 2018-05-23 ENCOUNTER — Ambulatory Visit (INDEPENDENT_AMBULATORY_CARE_PROVIDER_SITE_OTHER): Payer: Self-pay | Admitting: Family Medicine

## 2018-05-23 ENCOUNTER — Encounter: Payer: Self-pay | Admitting: Family Medicine

## 2018-05-23 ENCOUNTER — Ambulatory Visit (HOSPITAL_COMMUNITY)
Admission: RE | Admit: 2018-05-23 | Discharge: 2018-05-23 | Disposition: A | Discharge status: Home / Self Care | Payer: Self-pay | Source: Ambulatory Visit | Attending: Family Medicine | Admitting: Family Medicine

## 2018-05-23 VITALS — BP 168/68 | HR 76 | Temp 97.9°F | Resp 16 | Ht 66.0 in | Wt 312.0 lb

## 2018-05-23 DIAGNOSIS — Z23 Encounter for immunization: Secondary | ICD-10-CM

## 2018-05-23 DIAGNOSIS — M25562 Pain in left knee: Secondary | ICD-10-CM | POA: Insufficient documentation

## 2018-05-23 DIAGNOSIS — I5030 Unspecified diastolic (congestive) heart failure: Secondary | ICD-10-CM

## 2018-05-23 DIAGNOSIS — I10 Essential (primary) hypertension: Secondary | ICD-10-CM

## 2018-05-23 DIAGNOSIS — R0683 Snoring: Secondary | ICD-10-CM

## 2018-05-23 DIAGNOSIS — G8929 Other chronic pain: Secondary | ICD-10-CM | POA: Insufficient documentation

## 2018-05-23 DIAGNOSIS — R7303 Prediabetes: Secondary | ICD-10-CM

## 2018-05-23 LAB — POCT URINALYSIS DIPSTICK
Bilirubin, UA: NEGATIVE
Glucose, UA: NEGATIVE
Ketones, UA: NEGATIVE
Nitrite, UA: NEGATIVE
Protein, UA: POSITIVE — AB
Spec Grav, UA: 1.02 (ref 1.010–1.025)
Urobilinogen, UA: 0.2 E.U./dL
pH, UA: 6.5 (ref 5.0–8.0)

## 2018-05-23 LAB — POCT GLYCOSYLATED HEMOGLOBIN (HGB A1C): Hemoglobin A1C: 6.1 % — AB (ref 4.0–5.6)

## 2018-05-23 MED ORDER — FUROSEMIDE 40 MG PO TABS: ORAL_TABLET | ORAL | 0 refills | Status: DC

## 2018-05-23 NOTE — Progress Notes (Signed)
[facility]   Progress Note: General Provider: Lanae Boast, FNP  SUBJECTIVE:   Jasmine Novak is a 47 y.o. female who  has a past medical history of CHF (congestive heart failure) ([place]), Herpes simplex, and Hypertension.. Patient presents today for Hypertension and Knee Pain (left knee )  Patient has been lost to care for about a year. Hx of CHF, HTN and pre-diabetes. Patient with highest a1c of 6.3.  Has not taken metformin in the past year due to difficulty with swallowing pills.  Patient states that she has been out of lasix x 2 weeks. Has had swelling of lower extremities.  Never seen by cardiology for CHF. Patient with chronic pain of the left knee. She has taken otc medications without relief.  Also states that she has been having difficulty with sleeping. Has daytime sleepiness and fatigue.  Review of Systems  Constitutional: Negative.   HENT: Negative.   Eyes: Negative.   Respiratory: Negative.   Cardiovascular: Positive for leg swelling.  Gastrointestinal: Negative.   Genitourinary: Negative.   Musculoskeletal: Positive for joint pain (left knee).  Skin: Negative.   Neurological: Negative.   Psychiatric/Behavioral: Negative.      OBJECTIVE: BP (!) 168/68   Pulse 76   Temp 97.9 F (36.6 C) (Oral)   Resp 16   Ht 5\' 6"  (1.676 m)   Wt (!) 312 lb (141.5 kg)   LMP 05/11/2018   SpO2 100%   BMI 50.36 kg/m   Wt Readings from Last 3 Encounters:  05/23/18 (!) 312 lb (141.5 kg)  03/02/17 (!) 309 lb (140.2 kg)  08/25/16 (!) 307 lb (139.3 kg)     Physical Exam Vitals signs and nursing note reviewed.  Constitutional:      General: She is not in acute distress.    Appearance: She is well-developed.  HENT:     Head: Normocephalic and atraumatic.  Eyes:     Conjunctiva/sclera: Conjunctivae normal.     Pupils: Pupils are equal, round, and reactive to light.  Neck:     Musculoskeletal: Normal range of motion.    Cardiovascular:     Rate and Rhythm: Normal rate and regular rhythm.     Heart sounds: Normal heart sounds.  Pulmonary:     Effort: Pulmonary effort is normal. No respiratory distress.     Breath sounds: Normal breath sounds.  Abdominal:     General: Bowel sounds are normal. There is no distension.     Palpations: Abdomen is soft.  Musculoskeletal: Normal range of motion.     Right lower leg: Edema (1+ pitting) present.     Left lower leg: Edema (1+ pitting) present.  Skin:    General: Skin is warm and dry.  Neurological:     Mental Status: She is alert and oriented to person, place, and time.  Psychiatric:        Behavior: Behavior normal.        Thought Content: Thought content normal.      Diabetic Foot Exam - Simple   Simple Foot Form Diabetic Foot exam was performed with the following findings:  Yes 05/23/2018  9:24 AM  Visual Inspection See comments:  Yes Sensation Testing Intact to touch and monofilament testing bilaterally:  Yes Pulse Check Posterior Tibialis and Dorsalis pulse intact bilaterally:  Yes Comments Patient with thickened nails on the right foot. Predominately on the right great toe.      ASSESSMENT/PLAN:  1. Essential hypertension  Refilled medications and discussed diet and exercise to help control this disease.  - Urinalysis Dipstick - HgB A1c - Microalbumin/Creatinine Ratio, Urine - Ambulatory referral to Cardiology - CBC With Differential - Comprehensive metabolic panel - Lipid Panel - TSH  2. Diastolic congestive heart failure [facility]) Patient would like referral to cardiology for further evaluation. Will do so after renewing orange card and applying for cone discount.  - furosemide (LASIX) 40 MG tablet; TAKE 1 TABLET(40 MG) BY MOUTH DAILY  Dispense: 30 tablet; Refill: 0 - Ambulatory referral to Cardiology  3. Influenza vaccination administered at current visit Influenza vaccination given in the office visit today. - Flu Vaccine QUAD 6+  mos PF IM (Fluarix Quad PF)  4. Prediabetes a1c- 6.1 - Lipid Panel - HM Diabetes Foot Exam  5. Severe obesity (BMI >= 40) (H[facility]) The patient is asked to make an attempt to improve diet and exercise patterns to aid in medical management of this problem.  - Split night study; Future  6. Snoring - Split night study; Future  7. Chronic pain of left knee - DG Knee Complete 4 Views Left; Future         The patient was given clear instructions to go to ER or return to medical center if symptoms do not improve, worsen or new problems develop. The patient verbalized understanding and agreed with plan of care.   Ms. Doug Sou. Nathaneil Canary, FNP-BC [facility] [address] [place], [place] [address] [phone]     This note has been created with Dragon speech recognition software and smart phrase technology. Any transcriptional errors are unintentional.

## 2018-05-23 NOTE — Patient Instructions (Addendum)
It was a pleasure meeting you. I will see you in 3 months.  The book that I recommend is the Obesity Code by Dr. Sharman Cheek.     Sleep Apnea Sleep apnea affects breathing during sleep. It causes breathing to stop for a short time or to become shallow. It can also increase the risk of:  Heart attack.  Stroke.  Being very overweight (obese).  Diabetes.  Heart failure.  Irregular heartbeat. The goal of treatment is to help you breathe normally again. What are the causes? There are three kinds of sleep apnea:  Obstructive sleep apnea. This is caused by a blocked or collapsed airway.  Central sleep apnea. This happens when the brain does not send the right signals to the muscles that control breathing.  Mixed sleep apnea. This is a combination of obstructive and central sleep apnea. The most common cause of this condition is a collapsed or blocked airway. This can happen if:  Your throat muscles are too relaxed.  Your tongue and tonsils are too large.  You are overweight.  Your airway is too small. What increases the risk?  Being overweight.  Smoking.  Having a small airway.  Being older.  Being female.  Drinking alcohol.  Taking medicines to calm yourself (sedatives or tranquilizers).  Having family members with the condition. What are the signs or symptoms?  Trouble staying asleep.  Being sleepy or tired during the day.  Getting angry a lot.  Loud snoring.  Headaches in the morning.  Not being able to focus your mind (concentrate).  Forgetting things.  Less interest in sex.  Mood swings.  Personality changes.  Feelings of sadness (depression).  Waking up a lot during the night to pee (urinate).  Dry mouth.  Sore throat. How is this diagnosed?  Your medical history.  A physical exam.  A test that is done when you are sleeping (sleep study). The test is most often done in a sleep lab but may also be done at home. How is this  treated?   Sleeping on your side.  Using a medicine to get rid of mucus in your nose (decongestant).  Avoiding the use of alcohol, medicines to help you relax, or certain pain medicines (narcotics).  Losing weight, if needed.  Changing your diet.  Not smoking.  Using a machine to open your airway while you sleep, such as: ? An oral appliance. This is a mouthpiece that shifts your lower jaw forward. ? A CPAP device. This device blows air through a mask when you breathe out (exhale). ? An EPAP device. This has valves that you put in each nostril. ? A BPAP device. This device blows air through a mask when you breathe in (inhale) and breathe out.  Having surgery if other treatments do not work. It is important to get treatment for sleep apnea. Without treatment, it can lead to:  High blood pressure.  Coronary artery disease.  In men, not being able to have an erection (impotence).  Reduced thinking ability. Follow these instructions at home: Lifestyle  Make changes that your doctor recommends.  Eat a healthy diet.  Lose weight if needed.  Avoid alcohol, medicines to help you relax, and some pain medicines.  Do not use any products that contain nicotine or tobacco, such as cigarettes, e-cigarettes, and chewing tobacco. If you need help quitting, ask your doctor. General instructions  Take over-the-counter and prescription medicines only as told by your doctor.  If you were given a  machine to use while you sleep, use it only as told by your doctor.  If you are having surgery, make sure to tell your doctor you have sleep apnea. You may need to bring your device with you.  Keep all follow-up visits as told by your doctor. This is important. Contact a doctor if:  The machine that you were given to use during sleep bothers you or does not seem to be working.  You do not get better.  You get worse. Get help right away if:  Your chest hurts.  You have trouble  breathing in enough air.  You have an uncomfortable feeling in your back, arms, or stomach.  You have trouble talking.  One side of your body feels weak.  A part of your face is hanging down. These symptoms may be an emergency. Do not wait to see if the symptoms will go away. Get medical help right away. Call your local emergency services (911 in the [place] Do not drive yourself to the hospital. Summary  This condition affects breathing during sleep.  The most common cause is a collapsed or blocked airway.  The goal of treatment is to help you breathe normally while you sleep. This information is not intended to replace advice given to you by your health care provider. Make sure you discuss any questions you have with your health care provider. Document Released: 03/01/2008 Document Revised: 01/16/2018 Document Reviewed: 01/16/2018 Elsevier Interactive Patient Education  2019 [place] With Heart Failure  Heart failure is a long-term (chronic) condition in which the heart cannot pump enough blood through the body. When this happens, parts of the body do not get the blood and oxygen they need. There is no cure for heart failure at this time, so it is important for you to take good care of yourself and follow the treatment plan set by your health care provider. If you are living with heart failure, there are ways to help you manage the disease. Follow these instructions at home: Living with heart failure requires you to make changes in your life. Your health care team will teach you about the changes you need to make in order to relieve your symptoms and lower your risk of going to the hospital. Follow the treatment plan as set by your health care provider. Medicines Medicines are important in reducing your heart's workload, slowing the progression of heart failure, and improving your symptoms.  Take over-the-counter and prescription medicines only as told by your health care  provider.  Do not stop taking your medicine unless your health care provider tells you to do that.  Do not skip any dose of your medicine.  Refill prescriptions before you run out of medicine. You need your medicines every day. Eating and drinking   Eat heart-healthy foods. Talk with a dietitian to make an eating plan that is right for you. ? If directed by your health care provider: ? Limit salt (sodium). Lowering your sodium intake may reduce symptoms of heart failure. Ask a dietitian to recommend heart-healthy seasonings. ? Limit your fluid intake. Fluid restriction may reduce symptoms of heart failure. ? Use low-fat cooking methods instead of frying. Low-fat methods include roasting, grilling, broiling, baking, poaching, steaming, and stir-frying. ? Choose foods that contain no trans fat and are low in saturated fat and cholesterol. Healthy choices include fresh or frozen fruits and vegetables, fish, lean meats, legumes, fat-free or low-fat dairy products, and whole-grain or high-fiber foods.  Limit alcohol  intake to no more than 1 drink a day for nonpregnant women and 2 drinks a day for men. One drink equals 12 oz of beer, 5 oz of wine, or 1 oz of hard liquor. ? Drinking more than that is harmful to your heart. Tell your health care provider if you drink alcohol several times a week. ? Talk with your health care provider about whether any level of alcohol use is safe for you. Activity   Ask your health care provider about attending cardiac rehabilitation. These programs include aerobic physical activity, which provides many benefits for your heart.  If no cardiac rehabilitation program is available, ask your health care provider what aerobic exercises are safe for you to do. Lifestyle Make the lifestyle changes recommended by your health care provider. In general:  Lose weight if your health care provider tells you to do that. Weight loss may reduce symptoms of heart failure.  Do  not use any products that contain nicotine or tobacco, such as cigarettes or e-cigarettes. If you need help quitting, ask your health care provider.  Do not use street (illegal) drugs.  Return to your normal activities as told by your health care provider. Ask your health care provider what activities are safe for you. General instructions   Make sure you weigh yourself every day to track your weight. Rapid weight gain may indicate an increase in fluid in your body and may increase the workload of your heart. ? Weigh yourself every morning. Do this after you urinate but before you eat breakfast. ? Wear the same type of clothing, without shoes, each time you weigh yourself. ? Weigh yourself on the same scale and in the same spot each time.  Living with chronic heart failure often leads to emotions such as fear, stress, anxiety, and depression. If you feel any of these emotions and need help coping, contact your health care provider. Other ways to get help include: ? Talking to friends and family members about your condition. They can give you support and guidance. Explain your symptoms to them and, if comfortable, invite them to attend appointments or rehabilitation with you. ? Joining a support group for people with chronic heart failure. Talking with other people who have the same symptoms may give you new ways of coping with your disease and your emotions.  Stay up to date with your shots (vaccines). Staying current on pneumococcal and influenza vaccines is especially important in preventing germs from attacking your airways (respiratory infections).  Keep all follow-up visits as told by your health care provider. This is important. How to recognize changes in your condition You and your family members need to know what changes to watch for in your condition. Watch for the following changes and report them to your health care provider:  Sudden weight gain. Ask your health care provider what  amount of weight gain to report.  Shortness of breath: ? Feeling short of breath while at rest, with no exercise or activity that required great effort. ? Feeling breathless with activity.  Swelling of your lower legs or ankles.  Difficulty sleeping: ? You wake up feeling short of breath. ? You have to use more pillows to raise your head in order to sleep.  Frequent, dry, hacking cough.  Loss of appetite.  Feeling more tired all the time.  Depression or feelings of sadness or hopelessness.  Bloating in the stomach. Where to find more information  Local support groups. Ask your health care provider about  groups near you.  The American Heart Association: www.heart.org Contact a health care provider if:  You have a rapid weight gain.  You have increasing shortness of breath that is unusual for you.  You are unable to participate in your usual physical activities.  You tire easily.  You cough more than normal, especially with physical activity.  You have any swelling or more swelling in areas such as your hands, feet, ankles, or abdomen.  You feel like your heart is beating quickly (palpitations).  You become dizzy or light-headed when you stand up. Get help right away if:  You have difficulty breathing.  You notice or your family notices a change in your awareness, such as having trouble staying awake or having difficulty with concentration.  You have pain or discomfort in your chest.  You have an episode of fainting (syncope). Summary  There is no cure for heart failure, so it is important for you to take good care of yourself and follow the treatment plan set by your health care provider.  Medicines are important in reducing your heart's workload, slowing the progression of heart failure, and improving your symptoms.  Living with chronic heart failure often leads to emotions such as fear, stress, anxiety, and depression. If you are feeling any of these  emotions and need help coping, contact your health care provider. This information is not intended to replace advice given to you by your health care provider. Make sure you discuss any questions you have with your health care provider. Document Released: 10/05/2016 Document Revised: 10/05/2016 Document Reviewed: 10/05/2016 Elsevier Interactive Patient Education  2019 [employer] Prediabetes Eating Plan Prediabetes is a condition that causes blood sugar (glucose) levels to be higher than normal. This increases the risk for developing diabetes. In order to prevent diabetes from developing, your health care provider may recommend a diet and other lifestyle changes to help you:  Control your blood glucose levels.  Improve your cholesterol levels.  Manage your blood pressure. Your health care provider may recommend working with a diet and nutrition specialist (dietitian) to make a meal plan that is best for you. What are tips for following this plan? Lifestyle  Set weight loss goals with the help of your health care team. It is recommended that most people with prediabetes lose 7% of their current body weight.  Exercise for at least 30 minutes at least 5 days a week.  Attend a support group or seek ongoing support from a mental health counselor.  Take over-the-counter and prescription medicines only as told by your health care provider. Reading food labels  Read food labels to check the amount of fat, salt (sodium), and sugar in prepackaged foods. Avoid foods that have: ? Saturated fats. ? Trans fats. ? Added sugars.  Avoid foods that have more than 300 milligrams (mg) of sodium per serving. Limit your daily sodium intake to less than 2,300 mg each day. Shopping  Avoid buying pre-made and processed foods. Cooking  Cook with olive oil. Do not use butter, lard, or ghee.  Bake, broil, grill, or boil foods. Avoid frying. Meal planning   Work with your dietitian to develop an  eating plan that is right for you. This may include: ? Tracking how many calories you take in. Use a food diary, notebook, or mobile application to track what you eat at each meal. ? Using the glycemic index (GI) to plan your meals. The index tells you how quickly a food will raise your blood  glucose. Choose low-GI foods. These foods take a longer time to raise blood glucose.  Consider following a Mediterranean diet. This diet includes: ? Several servings each day of fresh fruits and vegetables. ? Eating fish at least twice a week. ? Several servings each day of whole grains, beans, nuts, and seeds. ? Using olive oil instead of other fats. ? Moderate alcohol consumption. ? Eating small amounts of red meat and whole-fat dairy.  If you have high blood pressure, you may need to limit your sodium intake or follow a diet such as the DASH eating plan. DASH is an eating plan that aims to lower high blood pressure. What foods are recommended? The items listed below may not be a complete list. Talk with your dietitian about what dietary choices are best for you. Grains Whole grains, such as whole-wheat or whole-grain breads, crackers, cereals, and pasta. Unsweetened oatmeal. Bulgur. Barley. Quinoa. Brown rice. Corn or whole-wheat flour tortillas or taco shells. Vegetables Lettuce. Spinach. Peas. Beets. Cauliflower. Cabbage. Broccoli. Carrots. Tomatoes. Squash. Eggplant. Herbs. Peppers. Onions. Cucumbers. Brussels sprouts. Fruits Berries. Bananas. Apples. Oranges. Grapes. Papaya. Mango. Pomegranate. Kiwi. Grapefruit. Cherries. Meats and other protein foods Seafood. Poultry without skin. Lean cuts of pork and beef. Tofu. Eggs. Nuts. Beans. Dairy Low-fat or fat-free dairy products, such as yogurt, cottage cheese, and cheese. Beverages Water. Tea. Coffee. Sugar-free or diet soda. Seltzer water. Lowfat or no-fat milk. Milk alternatives, such as soy or almond milk. Fats and oils Olive oil. Canola oil.  Sunflower oil. Grapeseed oil. Avocado. Walnuts. Sweets and desserts Sugar-free or low-fat pudding. Sugar-free or low-fat ice cream and other frozen treats. Seasoning and other foods Herbs. Sodium-free spices. Mustard. Relish. Low-fat, low-sugar ketchup. Low-fat, low-sugar barbecue sauce. Low-fat or fat-free mayonnaise. What foods are not recommended? The items listed below may not be a complete list. Talk with your dietitian about what dietary choices are best for you. Grains Refined white flour and flour products, such as bread, pasta, snack foods, and cereals. Vegetables Canned vegetables. Frozen vegetables with butter or cream sauce. Fruits Fruits canned with syrup. Meats and other protein foods Fatty cuts of meat. Poultry with skin. Breaded or fried meat. Processed meats. Dairy Full-fat yogurt, cheese, or milk. Beverages Sweetened drinks, such as sweet iced tea and soda. Fats and oils Butter. Lard. Ghee. Sweets and desserts Baked goods, such as cake, cupcakes, pastries, cookies, and cheesecake. Seasoning and other foods Spice mixes with added salt. Ketchup. Barbecue sauce. Mayonnaise. Summary  To prevent diabetes from developing, you may need to make diet and other lifestyle changes to help control blood sugar, improve cholesterol levels, and manage your blood pressure.  Set weight loss goals with the help of your health care team. It is recommended that most people with prediabetes lose 7 percent of their current body weight.  Consider following a Mediterranean diet that includes plenty of fresh fruits and vegetables, whole grains, beans, nuts, seeds, fish, lean meat, low-fat dairy, and healthy oils. This information is not intended to replace advice given to you by your health care provider. Make sure you discuss any questions you have with your health care provider. Document Released: 10/07/2014 Document Revised: 07/27/2016 Document Reviewed: 07/27/2016 Elsevier Interactive  Patient Education  2019 [place]. Acute Knee Pain, Adult Many things can cause knee pain. Sometimes, knee pain is sudden (acute) and may be caused by damage, swelling, or irritation of the muscles and tissues that support your knee. The pain often goes away on its own with time  and rest. If the pain does not go away, tests may be done to find out what is causing the pain. Follow these instructions at home: Pay attention to any changes in your symptoms. Take these actions to relieve your pain. If you have a knee sleeve or brace:   Wear the sleeve or brace as told by your doctor. Remove it only as told by your doctor.  Loosen the sleeve or brace if your toes: ? Tingle. ? Become numb. ? Turn cold and blue.  Keep the sleeve or brace clean.  If the sleeve or brace is not waterproof: ? Do not let it get wet. ? Cover it with a watertight covering when you take a bath or shower. Activity  Rest your knee.  Do not do things that cause pain.  Avoid activities where both feet leave the ground at the same time (high-impact activities). Examples are running, jumping rope, and doing jumping jacks.  Work with a physical therapist to make a safe exercise program, as told by your doctor. Managing pain, stiffness, and swelling   If told, put ice on the knee: ? Put ice in a plastic bag. ? Place a towel between your skin and the bag. ? Leave the ice on for 20 minutes, 2-3 times a day.  If told, put pressure (compression) on your injured knee to control swelling, give support, and help with discomfort. Compression may be done with an elastic bandage. General instructions  Take all medicines only as told by your doctor.  Raise (elevate) your knee while you are sitting or lying down. Make sure your knee is higher than your heart.  Sleep with a pillow under your knee.  Do not use any products that contain nicotine or tobacco. These include cigarettes, e-cigarettes, and chewing tobacco.  These products may slow down healing. If you need help quitting, ask your doctor.  If you are overweight, work with your doctor and a food expert (dietitian) to set goals to lose weight. Being overweight can make your knee hurt more.  Keep all follow-up visits as told by your doctor. This is important. Contact a doctor if:  The knee pain does not stop.  The knee pain changes or gets worse.  You have a fever along with knee pain.  Your knee feels warm when you touch it.  Your knee gives out or locks up. Get help right away if:  Your knee swells, and the swelling gets worse.  You cannot move your knee.  You have very bad knee pain. Summary  Many things can cause knee pain. The pain often goes away on its own with time and rest.  Your doctor may do tests to find out the cause of the pain.  Pay attention to any changes in your symptoms. Relieve your pain with rest, medicines, light activity, and use of ice.  Get help right away if you cannot move your knee or your knee pain is very bad. This information is not intended to replace advice given to you by your health care provider. Make sure you discuss any questions you have with your health care provider. Document Released: 08/19/2008 Document Revised: 11/02/2017 Document Reviewed: 11/02/2017 Elsevier Interactive Patient Education  2019 [employer].

## 2018-05-24 ENCOUNTER — Telehealth: Payer: Self-pay

## 2018-05-24 LAB — TSH: TSH: 0.816 u[IU]/mL (ref 0.450–4.500)

## 2018-05-24 LAB — MICROALBUMIN / CREATININE URINE RATIO
Creatinine, Urine: 187.7 mg/dL
Microalb/Creat Ratio: 11.8 mg/g creat (ref 0.0–30.0)
Microalbumin, Urine: 22.2 ug/mL

## 2018-05-24 LAB — CBC WITH DIFFERENTIAL
Basophils Absolute: 0 10*3/uL (ref 0.0–0.2)
Basos: 1 %
EOS (ABSOLUTE): 0.2 10*3/uL (ref 0.0–0.4)
Eos: 3 %
Hematocrit: 39.2 % (ref 34.0–46.6)
Hemoglobin: 11.7 g/dL (ref 11.1–15.9)
Immature Grans (Abs): 0 10*3/uL (ref 0.0–0.1)
Immature Granulocytes: 0 %
Lymphocytes Absolute: 1.6 10*3/uL (ref 0.7–3.1)
Lymphs: 25 %
MCH: 22.9 pg — ABNORMAL LOW (ref 26.6–33.0)
MCHC: 29.8 g/dL — ABNORMAL LOW (ref 31.5–35.7)
MCV: 77 fL — ABNORMAL LOW (ref 79–97)
Monocytes Absolute: 0.5 10*3/uL (ref 0.1–0.9)
Monocytes: 8 %
Neutrophils Absolute: 4.2 10*3/uL (ref 1.4–7.0)
Neutrophils: 63 %
RBC: 5.1 x10E6/uL (ref 3.77–5.28)
RDW: 16.1 % — ABNORMAL HIGH (ref 12.3–15.4)
WBC: 6.6 10*3/uL (ref 3.4–10.8)

## 2018-05-24 LAB — COMPREHENSIVE METABOLIC PANEL
ALT: 17 IU/L (ref 0–32)
AST: 17 IU/L (ref 0–40)
Albumin/Globulin Ratio: 1.1 — ABNORMAL LOW (ref 1.2–2.2)
Albumin: 4.1 g/dL (ref 3.5–5.5)
Alkaline Phosphatase: 106 IU/L (ref 39–117)
BUN/Creatinine Ratio: 13 (ref 9–23)
BUN: 12 mg/dL (ref 6–24)
Bilirubin Total: 0.2 mg/dL (ref 0.0–1.2)
CO2: 20 mmol/L (ref 20–29)
Calcium: 9.1 mg/dL (ref 8.7–10.2)
Chloride: 104 mmol/L (ref 96–106)
Creatinine, Ser: 0.93 mg/dL (ref 0.57–1.00)
GFR calc Af Amer: 84 mL/min/{1.73_m2} (ref >59)
GFR calc non Af Amer: 73 mL/min/{1.73_m2} (ref >59)
Globulin, Total: 3.7 g/dL (ref 1.5–4.5)
Glucose: 117 mg/dL — ABNORMAL HIGH (ref 65–99)
Potassium: 4.5 mmol/L (ref 3.5–5.2)
Sodium: 140 mmol/L (ref 134–144)
Total Protein: 7.8 g/dL (ref 6.0–8.5)

## 2018-05-24 LAB — LIPID PANEL
Chol/HDL Ratio: 4.8 ratio — ABNORMAL HIGH (ref 0.0–4.4)
Cholesterol, Total: 190 mg/dL (ref 100–199)
HDL: 40 mg/dL (ref >39)
LDL Calculated: 124 mg/dL — ABNORMAL HIGH (ref 0–99)
Triglycerides: 129 mg/dL (ref 0–149)
VLDL Cholesterol Cal: 26 mg/dL (ref 5–40)

## 2018-05-24 NOTE — Telephone Encounter (Signed)
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 You may experience stiffness in the morning that improves as the day goes on. The treatment is rest, weight loss, ice or heat and wearing a knee brace. Continue using NSAIDs such as ibuprofen, naproxen, diclofenac, etc.

## 2018-05-24 NOTE — Telephone Encounter (Signed)
-----   Message from Lanae Boast, [place] sent at 05/24/2018  2:50 PM EST ----- The  labs are stable without significant clinical change.  All other results are normal or within acceptable limits. No Medication changes

## 2018-05-24 NOTE — Telephone Encounter (Signed)
Called, no answer. No voicemail. Unable to leave a message. Thanks!

## 2018-05-25 NOTE — Telephone Encounter (Signed)
Called, no answer and no voicemail. Thanks!  

## 2018-05-28 NOTE — Telephone Encounter (Signed)
Mailed a Letter.

## 2018-07-20 ENCOUNTER — Other Ambulatory Visit: Payer: Self-pay | Admitting: Family Medicine

## 2018-07-20 NOTE — Progress Notes (Deleted)
Cardiology Office Note   Date:  07/20/2018   ID:  Jasmine Novak, DOB 01/06/1971, MRN [id number]  PCP:  Lanae Boast, [place]  Cardiologist:   No primary care provider on file. Referring:  ***  No chief complaint on file.     History of Present Illness: Jasmine Novak is a 48 y.o. female who is referred by *** for evaluation of chronic diastolic dysfunction.  ***    She had an echo in June 2017 demonstrated a normal EF with moderate concentric LVH.  ***   Past Medical History:  Diagnosis Date  . CHF (congestive heart failure) ([place])   . 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 Hypertension     Past Surgical History:  Procedure Laterality Date  . [place]   bilateral wrist     Current Outpatient Medications  Medication Sig Dispense Refill  . amLODipine (NORVASC) 10 MG tablet Take 1 tablet (10 mg total) by mouth daily. 90 tablet 2  . carvedilol (COREG) 6.25 MG tablet TAKE 1 TABLET(6.25 MG) BY MOUTH TWICE DAILY WITH A MEAL 180 tablet 0  . furosemide (LASIX) 40 MG tablet TAKE 1 TABLET(40 MG) BY MOUTH DAILY 30 tablet 0  . metFORMIN (GLUCOPHAGE) 500 MG tablet Take 1 tablet (500 mg total) by mouth daily with breakfast. (Patient not taking: Reported on 05/23/2018) 30 tablet 2   No current facility-administered medications for this visit.     Allergies:   Lisinopril    Social History:  The patient  reports that she has never smoked. She has never used smokeless tobacco. She reports that she does not drink alcohol or use drugs.   Family History:  The patient's ***family history includes Diabetes in her maternal grandmother; Heart disease in her daughter; Stroke in her father.    ROS:  Please see the history of present illness.   Otherwise, review of systems are positive for {NONE DEFAULTED:18576::"none"}.   All other systems are reviewed and negative.    PHYSICAL EXAM: VS:  There were no vitals taken for this visit. ,  BMI There is no height or weight on file to calculate BMI. GENERAL:  Well appearing HEENT:  Pupils equal round and reactive, fundi not visualized, oral mucosa unremarkable NECK:  No jugular venous distention, waveform within normal limits, carotid upstroke brisk and symmetric, no bruits, no thyromegaly LYMPHATICS:  No cervical, inguinal adenopathy LUNGS:  Clear to auscultation bilaterally BACK:  No CVA tenderness CHEST:  Unremarkable HEART:  PMI not displaced or sustained,S1 and S2 within normal limits, no S3, no S4, no clicks, no rubs, *** murmurs ABD:  Flat, positive bowel sounds normal in frequency in pitch, no bruits, no rebound, no guarding, no midline pulsatile mass, no hepatomegaly, no splenomegaly EXT:  2 plus pulses throughout, no edema, no cyanosis no clubbing SKIN:  No rashes no nodules NEURO:  Cranial nerves II through XII grossly intact, motor grossly intact throughout PSYCH:  Cognitively intact, oriented to person place and time    EKG:  EKG {ACTION; IS/IS QBH:41937902} ordered today. The ekg ordered today demonstrates ***   Recent Labs: 05/23/2018: ALT 17; BUN 12; Creatinine, Ser 0.93; Hemoglobin 11.7; Potassium 4.5; Sodium 140; TSH 0.816    Lipid Panel    Component Value Date/Time   CHOL 190 05/23/2018 0929   TRIG 129 05/23/2018 0929   HDL 40 05/23/2018 0929   CHOLHDL 4.8 (H) 05/23/2018 4097  CHOLHDL 4.8 11/13/2015 0217   VLDL 32 11/13/2015 0217   LDLCALC 124 (H) 05/23/2018 0929      Wt Readings from Last 3 Encounters:  05/23/18 (!) 312 lb (141.5 kg)  03/02/17 (!) 309 lb (140.2 kg)  08/25/16 (!) 307 lb (139.3 kg)      Other studies Reviewed: Additional studies/ records that were reviewed today include: ***. Review of the above records demonstrates:  Please see elsewhere in the note.  ***   ASSESSMENT AND PLAN:  CHRONIC DIASTOLIC HF:  ***  HTN:  ***   Current medicines are reviewed at length with the patient today.  The patient {ACTIONS;  HAS/DOES NOT HAVE:19233} concerns regarding medicines.  The following changes have been made:  {PLAN; NO CHANGE:13088:s}  Labs/ tests ordered today include: *** No orders of the defined types were placed in this encounter.    Disposition:   FU with ***    Signed, Minus Breeding, MD  07/20/2018 9:49 PM    [facility]

## 2018-07-23 ENCOUNTER — Encounter (INDEPENDENT_AMBULATORY_CARE_PROVIDER_SITE_OTHER): Payer: Self-pay

## 2018-07-23 ENCOUNTER — Ambulatory Visit: Payer: Self-pay | Admitting: Cardiology

## 2018-08-22 ENCOUNTER — Other Ambulatory Visit: Payer: Self-pay

## 2018-08-22 ENCOUNTER — Encounter: Payer: BLUE CROSS/BLUE SHIELD | Admitting: Family Medicine

## 2018-08-22 MED ORDER — FUROSEMIDE 40 MG PO TABS: ORAL_TABLET | ORAL | 0 refills | Status: DC

## 2018-08-22 MED ORDER — AMLODIPINE BESYLATE 10 MG PO TABS: 10.0000 mg | ORAL_TABLET | Freq: Every day | ORAL | 0 refills | Status: AC

## 2018-08-22 MED ORDER — CARVEDILOL 6.25 MG PO TABS: ORAL_TABLET | ORAL | 0 refills | Status: DC

## 2018-08-22 MED ORDER — METFORMIN HCL 500 MG PO TABS: 500.0000 mg | ORAL_TABLET | Freq: Every day | ORAL | 0 refills | Status: DC

## 2018-09-28 ENCOUNTER — Other Ambulatory Visit: Payer: Self-pay | Admitting: Family Medicine

## 2018-09-28 DIAGNOSIS — I5031 Acute diastolic (congestive) heart failure: Secondary | ICD-10-CM

## 2018-09-28 DIAGNOSIS — I5030 Unspecified diastolic (congestive) heart failure: Secondary | ICD-10-CM

## 2018-10-05 NOTE — Telephone Encounter (Signed)
Message sent to provider 

## 2019-03-27 ENCOUNTER — Other Ambulatory Visit: Payer: Self-pay

## 2019-03-27 DIAGNOSIS — Z20822 Contact with and (suspected) exposure to covid-19: Secondary | ICD-10-CM

## 2019-03-28 LAB — NOVEL CORONAVIRUS, NAA: SARS-CoV-2, NAA: NOT DETECTED

## 2019-04-01 ENCOUNTER — Telehealth: Payer: Self-pay

## 2019-04-01 NOTE — Telephone Encounter (Signed)
Pt. Asking about quarantine time frames. Had exposure from brother. Will reach out to her PCP for work note.

## 2019-07-08 ENCOUNTER — Other Ambulatory Visit: Payer: Self-pay | Admitting: Family Medicine

## 2019-07-08 DIAGNOSIS — I5031 Acute diastolic (congestive) heart failure: Secondary | ICD-10-CM

## 2019-12-17 IMAGING — CR DG KNEE COMPLETE 4+V*L*
4 series · 4 of 4 positions shown · non-contrast
Comparison: None.

CLINICAL DATA: Left knee pain for several months, no known injury,
initial encounter

EXAM:
LEFT KNEE - COMPLETE 4+ VIEW

[t knee ap left]
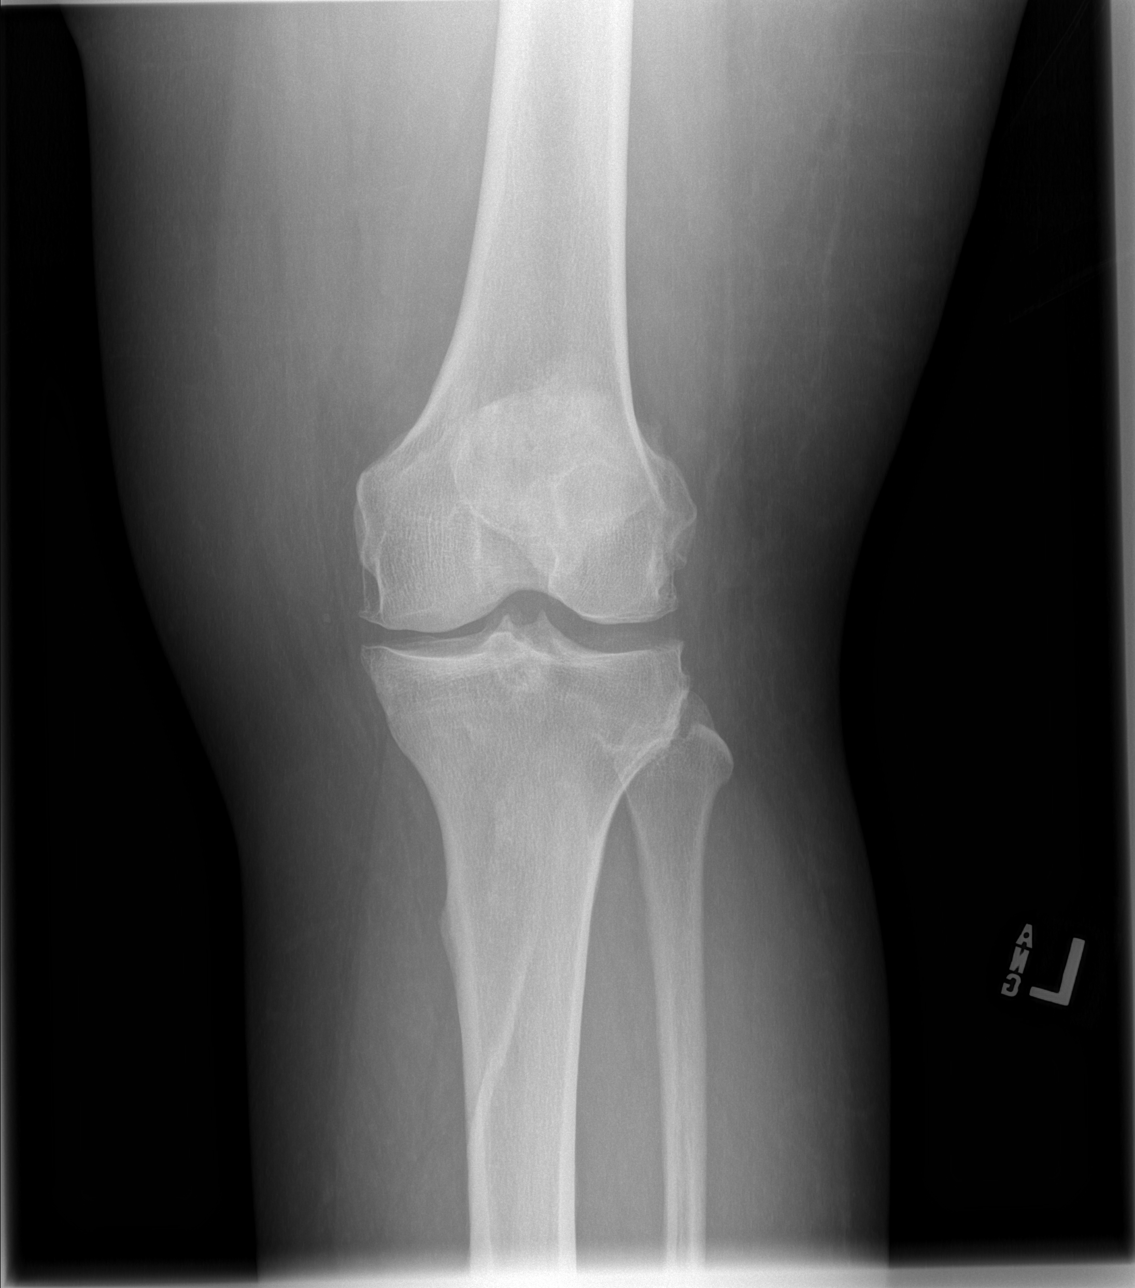

[t knee oblique left (1 of 2)]
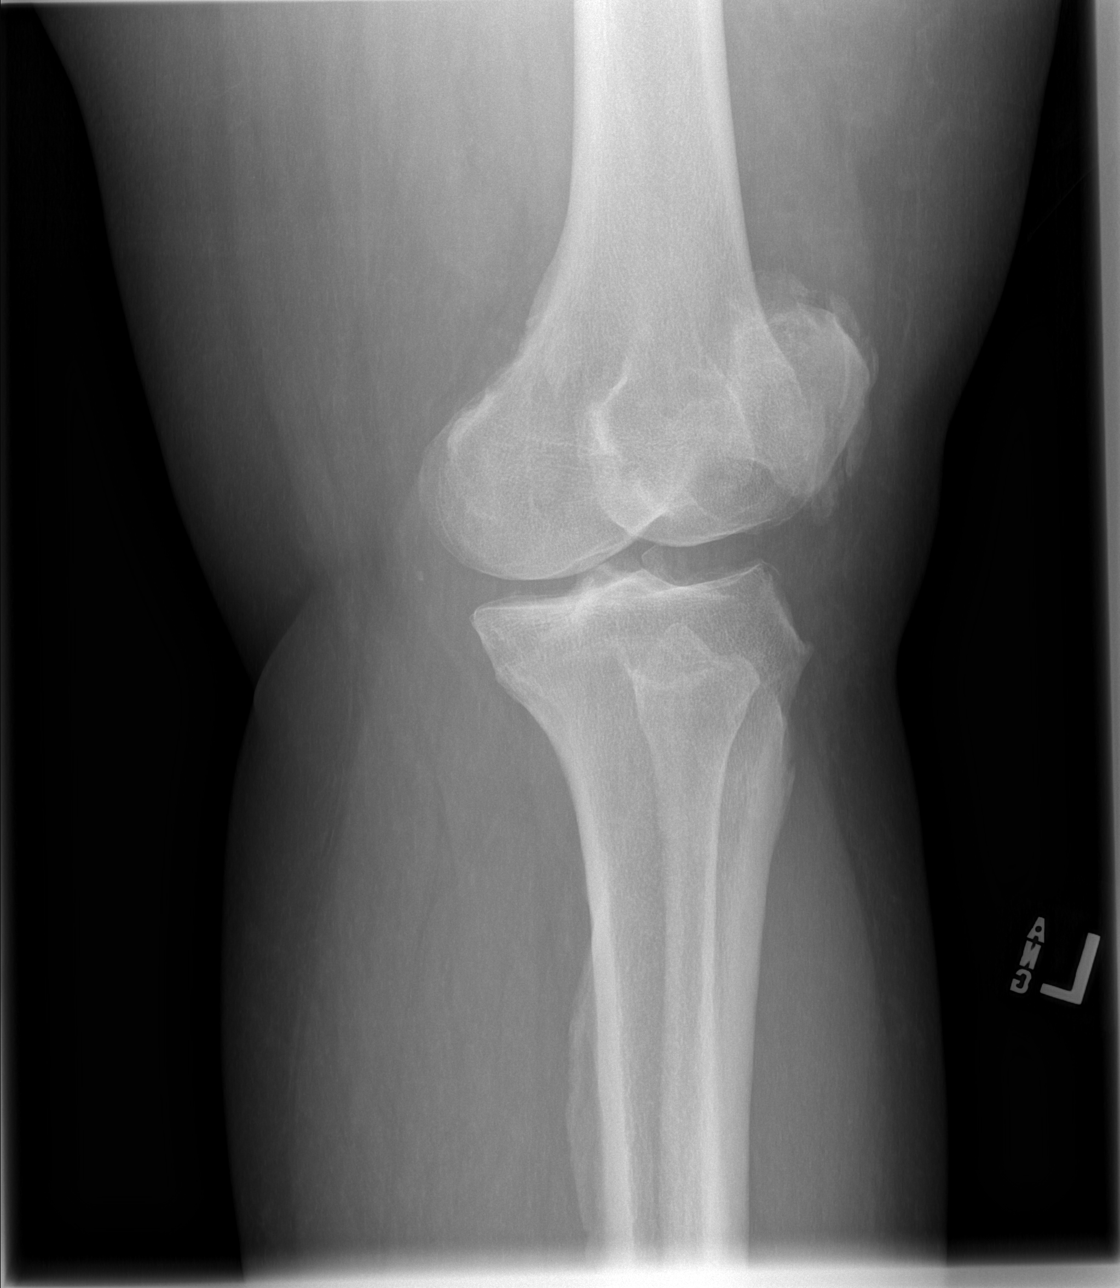

[t knee oblique left (2 of 2)]
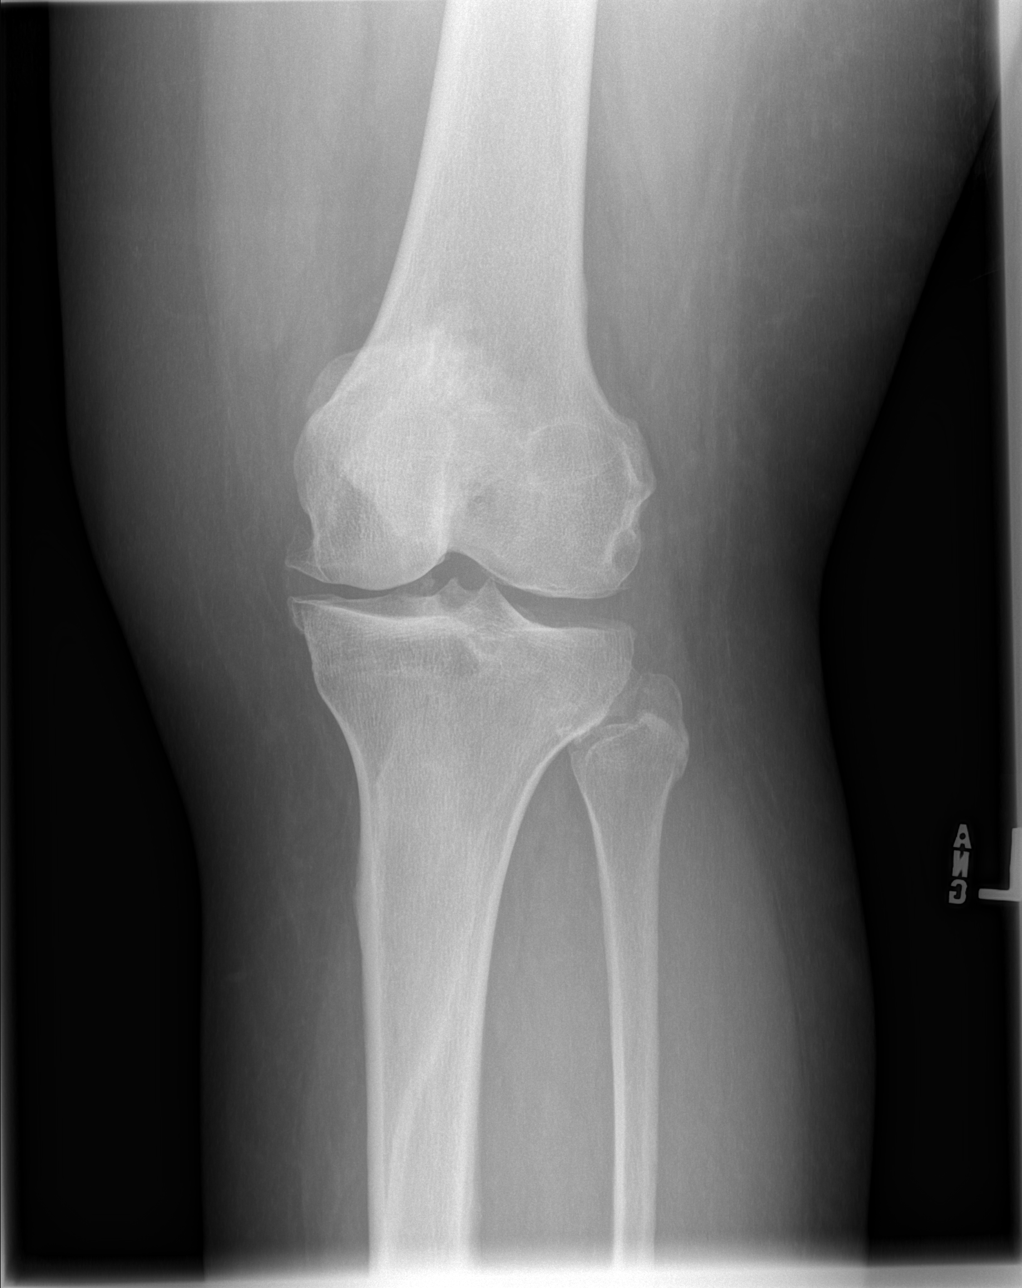

[t knee lat left]
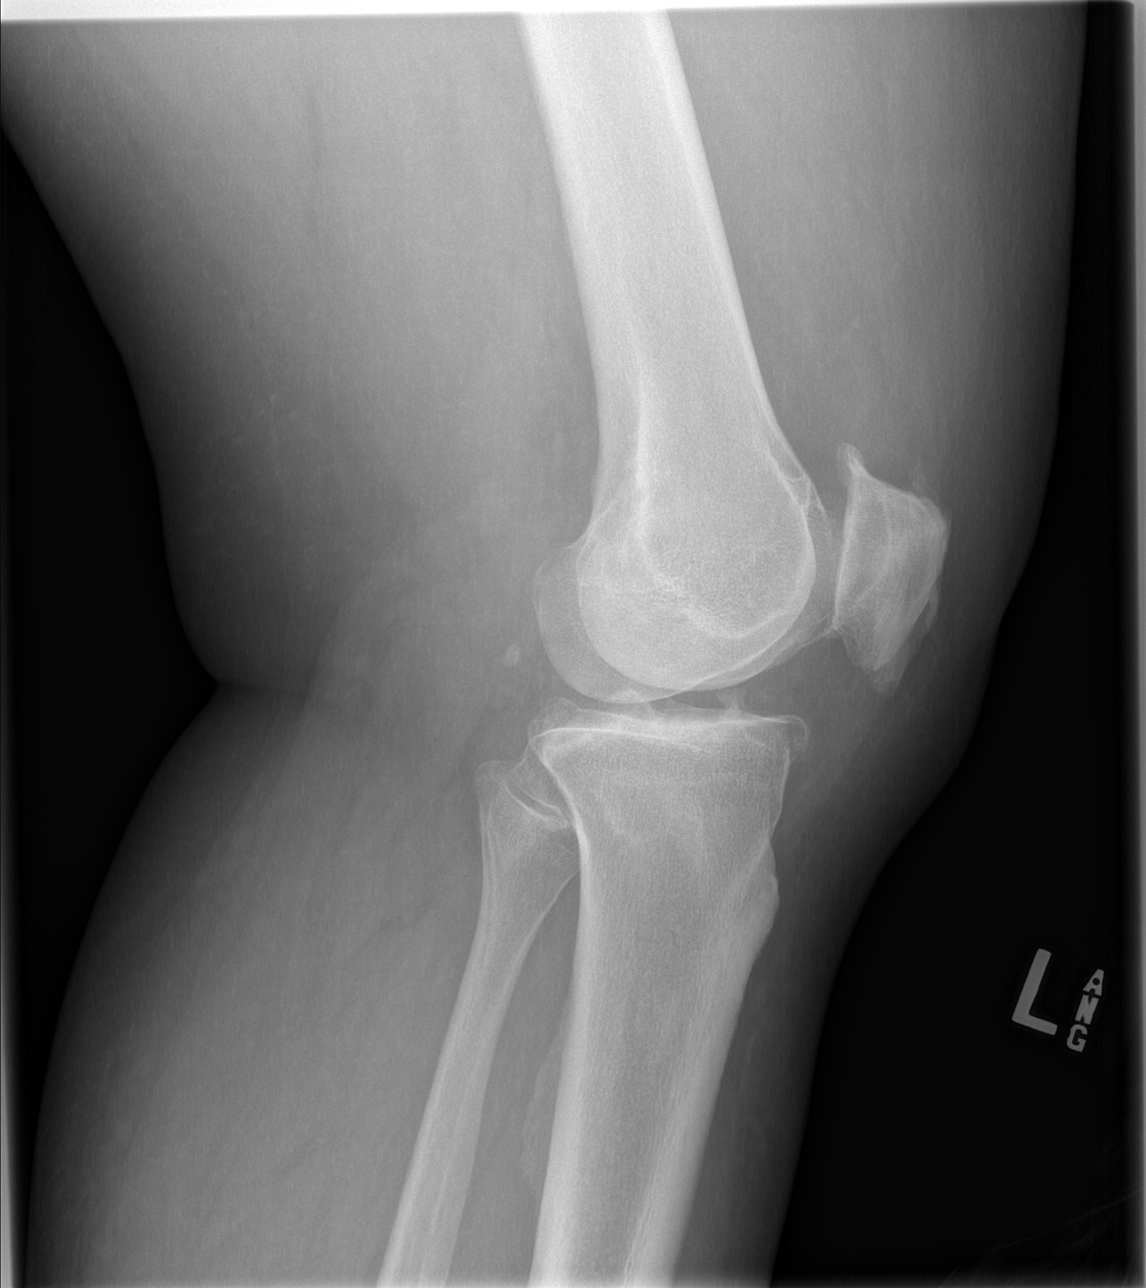

[4 of 4 positions shown; findings below may reference images not displayed]

FINDINGS: Tricompartmental degenerative changes are noted. No acute fracture
or dislocation is noted. No joint effusion is seen. Sclerosis is
noted along the proximal tibial shaft which may be related to prior
fracture with healing. Clinical correlation is recommended.
IMPRESSION: Tricompartmental degenerative changes without acute abnormality.

## 2021-10-20 ENCOUNTER — Emergency Department (HOSPITAL_BASED_OUTPATIENT_CLINIC_OR_DEPARTMENT_OTHER): Payer: Self-pay | Admitting: Radiology

## 2021-10-20 ENCOUNTER — Emergency Department (HOSPITAL_BASED_OUTPATIENT_CLINIC_OR_DEPARTMENT_OTHER)
Admission: EM | Admit: 2021-10-20 | Discharge: 2021-10-20 | Disposition: A | Discharge status: Home / Self Care | Payer: Self-pay | Attending: Emergency Medicine | Admitting: Emergency Medicine

## 2021-10-20 ENCOUNTER — Encounter (HOSPITAL_BASED_OUTPATIENT_CLINIC_OR_DEPARTMENT_OTHER): Payer: Self-pay | Admitting: Emergency Medicine

## 2021-10-20 ENCOUNTER — Other Ambulatory Visit: Payer: Self-pay

## 2021-10-20 DIAGNOSIS — S46911A Strain of unspecified muscle, fascia and tendon at shoulder and upper arm level, right arm, initial encounter: Secondary | ICD-10-CM | POA: Insufficient documentation

## 2021-10-20 DIAGNOSIS — X500XXA Overexertion from strenuous movement or load, initial encounter: Secondary | ICD-10-CM | POA: Insufficient documentation

## 2021-10-20 NOTE — ED Provider Notes (Signed)
?[facility] EMERGENCY DEPT ?Provider Note ? ? ?CSN: [id number] ?Arrival date & time: 10/20/21  0856 ? ?  ? ?History ? ?Chief Complaint  ?Patient presents with  ? Shoulder Pain  ? ? ?Jasmine Novak is a 51 y.o. female. ? ?Patient presents with isolated right shoulder pain and stiffness gradually worsening since yesterday.  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  Pain worse at night.  No chest pain or shortness of breath, no direct trauma.  No known right shoulder pathology.  No fevers or chills.  Pain with any range of motion. ? ? ?  ? ?Home Medications ?Prior to Admission medications   ?Medication Sig Start Date End Date Taking? Authorizing Provider  ?amLODipine (NORVASC) 10 MG tablet Take 1 tablet (10 mg total) by mouth daily. 08/22/18   Lanae Boast, [place]  ?carvedilol (COREG) 6.25 MG tablet TAKE 1 TABLET(6.25 MG) BY MOUTH TWICE DAILY WITH A MEAL 10/01/18   Lanae Boast, FNP  ?furosemide (LASIX) 40 MG tablet TAKE 1 TABLET(40 MG) BY MOUTH DAILY 10/01/18   Lanae Boast, FNP  ?metFORMIN (GLUCOPHAGE) 500 MG tablet Take 1 tablet (500 mg total) by mouth daily with breakfast. 08/22/18   Lanae Boast, FNP  ?   ? ?Allergies    ?Lisinopril   ? ?Review of Systems   ?Review of Systems  ?Constitutional:  Negative for chills and fever.  ?HENT:  Negative for congestion.   ?Eyes:  Negative for visual disturbance.  ?Respiratory:  Negative for shortness of breath.   ?Cardiovascular:  Negative for chest pain.  ?Gastrointestinal:  Negative for abdominal pain and vomiting.  ?Genitourinary:  Negative for dysuria and flank pain.  ?Musculoskeletal:  Negative for back pain, neck pain and neck stiffness.  ?Skin:  Negative for rash.  ?Neurological:  Negative for weakness, light-headedness and headaches.  ? ?Physical Exam ?Updated Vital Signs ?BP (!) 177/111 (BP Location: Left Arm)   Pulse 85   Temp 98.5 ?F (36.9 ?C)   Resp 20   Ht '5\' 6"'$  (1.676 m)   Wt (!) 140.2 kg   SpO2 100%    BMI 49.87 kg/m?  ?Physical Exam ?Vitals and nursing note reviewed.  ?Constitutional:   ?   General: She is not in acute distress. ?   Appearance: She is well-developed.  ?HENT:  ?   Head: Normocephalic and atraumatic.  ?   Mouth/Throat:  ?   Mouth: Mucous membranes are moist.  ?Eyes:  ?   General:     ?   Right eye: No discharge.     ?   Left eye: No discharge.  ?Neck:  ?   Trachea: No tracheal deviation.  ?Cardiovascular:  ?   Rate and Rhythm: Normal rate.  ?Pulmonary:  ?   Effort: Pulmonary effort is normal.  ?Abdominal:  ?   General: There is no distension.  ?   Palpations: Abdomen is soft.  ?   Tenderness: There is no abdominal tenderness. There is no guarding.  ?Musculoskeletal:     ?   General: Tenderness present. No swelling or deformity.  ?   Cervical back: Normal range of motion and neck supple. No rigidity.  ?Skin: ?   General: Skin is warm.  ?   Capillary Refill: Capillary refill takes less than 2 seconds.  ?   Findings: No rash.  ?Neurological:  ?   General: No focal deficit present.  ?   Mental Status: She is alert.  ?  Cranial Nerves: No cranial nerve deficit.  ?Psychiatric:     ?   Mood and Affect: Mood normal.  ? ? ?ED Results / Procedures / Treatments   ?Labs ?(all labs ordered are listed, but only abnormal results are displayed) ?Labs Reviewed - No data to display ? ?EKG ?None ? ?Radiology ?No results found. ? ?Procedures ?Procedures  ? ? ?Medications Ordered in ED ?Medications - No data to display ? ?ED Course/ Medical Decision Making/ A&P ?  ?                        ?Medical Decision Making ?Amount and/or Complexity of Data Reviewed ?Radiology: ordered. ? ? ?Patient presents with isolated right shoulder pain discussed differential diagnosis including rotator cuff strain, bursitis, tendinitis, occult fracture less likely without direct trauma, other.  X-ray reviewed and interpreted independently no acute findings no occult fracture.  Patient okay without pain meds at this time.  Discussed  follow-up with orthopedics and primary doctor.  Work note given. ? ? ? ? ? ? ? ?Final Clinical Impression(s) / ED Diagnoses ?Final diagnoses:  ?Right shoulder strain, initial encounter  ? ? ?Rx / DC Orders ?ED Discharge Orders   ? ? None  ? ?  ? ? ?  ?Elnora Morrison, MD ?10/20/21 1037 ? ?

## 2021-10-20 NOTE — ED Triage Notes (Signed)
R shoulder pain and stiffness since yesterday. Pain is worse at night. No known injury.  ?

## 2021-10-20 NOTE — ED Notes (Signed)
Discharge instructions and follow up care reviewed and explained. Ice pack was given. Pt had no additional questions on discharge. Pt caox4 and ambulatory on departure.  ?

## 2021-10-20 NOTE — Discharge Instructions (Addendum)
Your xray was okay. ?Use ice regularly and ibuprofen every 6 hours needed for pain. ?Follow-up with primary doctor or sports medicine for further evaluation. ? ?

## 2022-04-17 ENCOUNTER — Encounter (HOSPITAL_BASED_OUTPATIENT_CLINIC_OR_DEPARTMENT_OTHER): Payer: Self-pay

## 2022-04-17 ENCOUNTER — Other Ambulatory Visit: Payer: Self-pay

## 2022-04-17 ENCOUNTER — Emergency Department (HOSPITAL_BASED_OUTPATIENT_CLINIC_OR_DEPARTMENT_OTHER)
Admission: EM | Admit: 2022-04-17 | Discharge: 2022-04-17 | Disposition: A | Discharge status: Home / Self Care | Payer: Self-pay | Attending: Emergency Medicine | Admitting: Emergency Medicine

## 2022-04-17 DIAGNOSIS — I110 Hypertensive heart disease with heart failure: Secondary | ICD-10-CM | POA: Insufficient documentation

## 2022-04-17 DIAGNOSIS — Z79899 Other long term (current) drug therapy: Secondary | ICD-10-CM | POA: Insufficient documentation

## 2022-04-17 DIAGNOSIS — L03116 Cellulitis of left lower limb: Secondary | ICD-10-CM | POA: Insufficient documentation

## 2022-04-17 DIAGNOSIS — I509 Heart failure, unspecified: Secondary | ICD-10-CM | POA: Insufficient documentation

## 2022-04-17 DIAGNOSIS — R030 Elevated blood-pressure reading, without diagnosis of hypertension: Secondary | ICD-10-CM

## 2022-04-17 LAB — CBC
HCT: 40.5 % (ref 36.0–46.0)
Hemoglobin: 12.5 g/dL (ref 12.0–15.0)
MCH: 25.1 pg — ABNORMAL LOW (ref 26.0–34.0)
MCHC: 30.9 g/dL (ref 30.0–36.0)
MCV: 81.3 fL (ref 80.0–100.0)
Platelets: 413 10*3/uL — ABNORMAL HIGH (ref 150–400)
RBC: 4.98 MIL/uL (ref 3.87–5.11)
RDW: 15.6 % — ABNORMAL HIGH (ref 11.5–15.5)
WBC: 8.8 10*3/uL (ref 4.0–10.5)
nRBC: 0 % (ref 0.0–0.2)

## 2022-04-17 LAB — BASIC METABOLIC PANEL
Anion gap: 9 (ref 5–15)
BUN: 12 mg/dL (ref 6–20)
CO2: 27 mmol/L (ref 22–32)
Calcium: 9.6 mg/dL (ref 8.9–10.3)
Chloride: 99 mmol/L (ref 98–111)
Creatinine, Ser: 1.03 mg/dL — ABNORMAL HIGH (ref 0.44–1.00)
GFR, Estimated: >60 mL/min (ref >60)
Glucose, Bld: 93 mg/dL (ref 70–99)
Potassium: 4.1 mmol/L (ref 3.5–5.1)
Sodium: 135 mmol/L (ref 135–145)

## 2022-04-17 LAB — PREGNANCY, URINE: Preg Test, Ur: NEGATIVE

## 2022-04-17 MED ORDER — CEPHALEXIN 500 MG PO CAPS: 500.0000 mg | ORAL_CAPSULE | Freq: Four times a day (QID) | ORAL | 0 refills | Status: DC

## 2022-04-17 MED ORDER — CEPHALEXIN 250 MG/5ML PO SUSR: 500.0000 mg | Freq: Four times a day (QID) | ORAL | 0 refills | Status: AC

## 2022-04-17 NOTE — ED Provider Notes (Addendum)
[facility] EMERGENCY DEPT Provider Note   CSN: [id number] Arrival date & time: 04/17/22  1901     History  Chief Complaint  Patient presents with  . Leg Swelling    Jasmine Novak is a 51 y.o. female.  Pt is a 51 yo female with pmh of HTN, CHF, and chronic lower extremity lymphedema presenting for leg swelling. Pt admits to swelling and warmth in the left leg with 3 skin breaks. No recent antibiotic use. No medication allergies to antibiotics. Has been using Motrin 200 mg twice every six hours for pain. Denies fevers, chills, nausea, or vomiting.   The history is provided by the patient. No language interpreter was used.       Home Medications Prior to Admission medications   Medication Sig Start Date End Date Taking? Authorizing Provider  cephALEXin (KEFLEX) 250 MG/5ML suspension Take 10 mLs (500 mg total) by mouth 4 (four) times daily for 7 days. 04/17/22 17/61/60 Yes Campbell Stall P, DO  amLODipine (NORVASC) 10 MG tablet Take 1 tablet (10 mg total) by mouth daily. 08/22/18   Lanae Boast, FNP  carvedilol (COREG) 6.25 MG tablet TAKE 1 TABLET(6.25 MG) BY MOUTH TWICE DAILY WITH A MEAL 10/01/18   Lanae Boast, FNP  furosemide (LASIX) 40 MG tablet TAKE 1 TABLET(40 MG) BY MOUTH DAILY 10/01/18   Lanae Boast, FNP  metFORMIN (GLUCOPHAGE) 500 MG tablet Take 1 tablet (500 mg total) by mouth daily with breakfast. 08/22/18   Lanae Boast, FNP      Allergies    Lisinopril    Review of Systems   Review of Systems  Constitutional:  Negative for chills and fever.  HENT:  Negative for ear pain and sore throat.   Eyes:  Negative for pain and visual disturbance.  Respiratory:  Negative for cough and shortness of breath.   Cardiovascular:  Positive for leg swelling. Negative for chest pain and palpitations.  Gastrointestinal:  Negative for abdominal pain and vomiting.  Genitourinary:  Negative for dysuria and hematuria.  Musculoskeletal:  Negative for arthralgias and  back pain.  Skin:  Negative for color change and rash.  Neurological:  Negative for seizures and syncope.  All other systems reviewed and are negative.   Physical Exam Updated Vital Signs BP (!) 172/78   Pulse 75   Temp 98 F (36.7 C) (Oral)   Resp 18   Ht '5\' 6"'$  (1.676 m)   Wt (!) 140.6 kg   LMP 01/17/2022 (Approximate)   SpO2 99%   BMI 50.04 kg/m  Physical Exam Vitals and nursing note reviewed.  Constitutional:      General: She is not in acute distress.    Appearance: She is well-developed.  HENT:     Head: Normocephalic and atraumatic.  Eyes:     Conjunctiva/sclera: Conjunctivae normal.  Cardiovascular:     Rate and Rhythm: Normal rate and regular rhythm.     Heart sounds: No murmur heard. Pulmonary:     Effort: Pulmonary effort is normal. No respiratory distress.     Breath sounds: Normal breath sounds.  Abdominal:     Palpations: Abdomen is soft.     Tenderness: There is no abdominal tenderness.  Musculoskeletal:        General: No swelling.     Cervical back: Neck supple.     Right lower leg: 4+ Pitting Edema present.     Left lower leg: 4+ Pitting Edema present.  Skin:    General: Skin is warm and  dry.     Capillary Refill: Capillary refill takes less than 2 seconds.       Neurological:     Mental Status: She is alert.  Psychiatric:        Mood and Affect: Mood normal.    ED Results / Procedures / Treatments   Labs (all labs ordered are listed, but only abnormal results are displayed) Labs Reviewed  BASIC METABOLIC PANEL - Abnormal; Notable for the following components:      Result Value   Creatinine, Ser 1.03 (*)    All other components within normal limits  CBC - Abnormal; Notable for the following components:   MCH 25.1 (*)    RDW 15.6 (*)    Platelets 413 (*)    All other components within normal limits  PREGNANCY, URINE    EKG None  Radiology No results found.  Procedures Procedures    Medications Ordered in ED Medications -  No data to display  ED Course/ Medical Decision Making/ A&P                           Medical Decision Making Amount and/or Complexity of Data Reviewed Labs: ordered.  Risk Prescription drug management.   11:23 PM  51 yo female with pmh of HTN, CHF, and chronic lower extremity lymphedema presenting for leg swelling.  Warm, swelling, and erythema on exam concerning for cellulitis. Patient is otherwise afebrile with no s/s sepsis. Leg is neurovascularly intact.  Hypertension present. Pt states she took her home meds for bp control today but is in pain and in the ED late at night. Will defer to primary care for follow up and further management if bp remains elevated.  Patient in no distress and overall condition improved here in the ED. Detailed discussions were had with the patient regarding current findings, and need for close f/u with PCP or on call doctor. The patient has been instructed to return immediately if the symptoms worsen in any way for re-evaluation. Patient verbalized understanding and is in agreement with current care plan. All questions answered prior to discharge.         Final Clinical Impression(s) / ED Diagnoses Final diagnoses:  Cellulitis of left lower extremity  Elevated blood pressure reading    Rx / DC Orders ED Discharge Orders          Ordered    cephALEXin (KEFLEX) 500 MG capsule  4 times daily,   Status:  Discontinued        04/17/22 2321    cephALEXin (KEFLEX) 250 MG/5ML suspension  4 times daily        04/17/22 2322              Lianne Cure, DO 82/95/62 1308    Lianne Cure, DO 65/78/46 2324

## 2022-04-17 NOTE — Discharge Instructions (Addendum)
Use Bacitracin in the morning and before bed on skin tears. Keep leg clean and dry. Take oral antibiotics as prescribed. Follow with your primary care physician in 5 days if symtpoms do not begin to improve.

## 2022-04-17 NOTE — ED Triage Notes (Signed)
L calf swelling, painful to touch. Pt states that she has h/x edema. Redness to area. No heat noted.

## 2022-06-08 ENCOUNTER — Emergency Department (HOSPITAL_BASED_OUTPATIENT_CLINIC_OR_DEPARTMENT_OTHER)
Admission: EM | Admit: 2022-06-08 | Discharge: 2022-06-09 | Disposition: A | Discharge status: Home / Self Care | Payer: BLUE CROSS/BLUE SHIELD | Attending: Emergency Medicine | Admitting: Emergency Medicine

## 2022-06-08 ENCOUNTER — Encounter (HOSPITAL_BASED_OUTPATIENT_CLINIC_OR_DEPARTMENT_OTHER): Payer: Self-pay

## 2022-06-08 ENCOUNTER — Other Ambulatory Visit: Payer: Self-pay

## 2022-06-08 DIAGNOSIS — R2243 Localized swelling, mass and lump, lower limb, bilateral: Secondary | ICD-10-CM | POA: Diagnosis not present

## 2022-06-08 DIAGNOSIS — I110 Hypertensive heart disease with heart failure: Secondary | ICD-10-CM | POA: Diagnosis not present

## 2022-06-08 DIAGNOSIS — Z79899 Other long term (current) drug therapy: Secondary | ICD-10-CM | POA: Insufficient documentation

## 2022-06-08 DIAGNOSIS — I509 Heart failure, unspecified: Secondary | ICD-10-CM | POA: Diagnosis not present

## 2022-06-08 DIAGNOSIS — L03116 Cellulitis of left lower limb: Secondary | ICD-10-CM | POA: Insufficient documentation

## 2022-06-08 DIAGNOSIS — T148XXA Other injury of unspecified body region, initial encounter: Secondary | ICD-10-CM

## 2022-06-08 LAB — CBC
HCT: 38.7 % (ref 36.0–46.0)
Hemoglobin: 12.2 g/dL (ref 12.0–15.0)
MCH: 25.8 pg — ABNORMAL LOW (ref 26.0–34.0)
MCHC: 31.5 g/dL (ref 30.0–36.0)
MCV: 82 fL (ref 80.0–100.0)
Platelets: 319 10*3/uL (ref 150–400)
RBC: 4.72 MIL/uL (ref 3.87–5.11)
RDW: 16.3 % — ABNORMAL HIGH (ref 11.5–15.5)
WBC: 8.4 10*3/uL (ref 4.0–10.5)
nRBC: 0 % (ref 0.0–0.2)

## 2022-06-08 LAB — BASIC METABOLIC PANEL
Anion gap: 8 (ref 5–15)
BUN: 14 mg/dL (ref 6–20)
CO2: 24 mmol/L (ref 22–32)
Calcium: 9.7 mg/dL (ref 8.9–10.3)
Chloride: 104 mmol/L (ref 98–111)
Creatinine, Ser: 0.92 mg/dL (ref 0.44–1.00)
GFR, Estimated: >60 mL/min (ref >60)
Glucose, Bld: 109 mg/dL — ABNORMAL HIGH (ref 70–99)
Potassium: 4 mmol/L (ref 3.5–5.1)
Sodium: 136 mmol/L (ref 135–145)

## 2022-06-08 NOTE — ED Triage Notes (Signed)
Patient here POV from Home.  Endorses 3-4 Weeks ago being seen for an Abscess to her Right Lower Posterior Leg.    States she felt slowly better with Antibiotic Therapy after Discharge. Then it began to have some more drainage. Also notes an odor from Drainage as well.   No Known Fevers.   NAD Noted during Triage. A&Ox4. GCS 15. Ambulatory.

## 2022-06-08 NOTE — ED Provider Notes (Signed)
[facility] EMERGENCY DEPT Provider Note   CSN: [id number] Arrival date & time: 06/08/22  1749     History  Chief Complaint  Patient presents with   Wound Check    Jasmine Novak is a 52 y.o. female h/o HTN, CHF, chronic lower extremity lymphedema, presented today for cellulitis in L leg that has been present since November. Patient was given keflex for the cellulitis which patient reports finishing. Patient reported draining coming out of posterior calf that began last week. Drainage has been pus-like with an odor. Patient stated there is still some soreness around the area of the drainage. Patient denied fever, shortness of breath, CP, abd pain, loss of sensation/motor skills.     Home Medications Prior to Admission medications   Medication Sig Start Date End Date Taking? Authorizing Provider  amLODipine (NORVASC) 10 MG tablet Take 1 tablet (10 mg total) by mouth daily. 08/22/18   Jasmine Boast, FNP  carvedilol (COREG) 6.25 MG tablet TAKE 1 TABLET(6.25 MG) BY MOUTH TWICE DAILY WITH A MEAL 10/01/18   Jasmine Boast, FNP  doxycycline (VIBRAMYCIN) 100 MG capsule Take 1 capsule (100 mg total) by mouth 2 (two) times daily. 06/09/22  Yes Jasmine Nida T, PA-C  furosemide (LASIX) 40 MG tablet TAKE 1 TABLET(40 MG) BY MOUTH DAILY 10/01/18   Jasmine Boast, FNP  metFORMIN (GLUCOPHAGE) 500 MG tablet Take 1 tablet (500 mg total) by mouth daily with breakfast. 08/22/18   Jasmine Boast, FNP      Allergies    Lisinopril    Review of Systems   Review of Systems See HPI  Physical Exam Updated Vital Signs BP (!) 215/96   Pulse 93   Temp 98.1 F (36.7 C) (Oral)   Resp 18   Ht '5\' 6"'$  (1.676 m)   Wt (!) 140.6 kg   SpO2 100%   BMI 50.03 kg/m  Physical Exam Eyes:     Extraocular Movements: Extraocular movements intact.     Pupils: Pupils are equal, round, and reactive to light.  Pulmonary:     Effort: Pulmonary effort is normal.  Musculoskeletal:     Right lower leg: Edema  present.     Left lower leg: Edema present.       Legs:     Comments: 1 cm diameter hole draining pus with surrounding cellulitis and scales of L lower leg. Bilateral lympedema   Skin:    Comments: Lichenification present on bilaterally but worse on L leg  Neurological:     General: No focal deficit present.     Mental Status: She is alert and oriented to person, place, and time.  Psychiatric:        Mood and Affect: Mood normal.     ED Results / Procedures / Treatments   Labs (all labs ordered are listed, but only abnormal results are displayed) Labs Reviewed  CBC - Abnormal; Notable for the following components:      Result Value   MCH 25.8 (*)    RDW 16.3 (*)    All other components within normal limits  BASIC METABOLIC PANEL - Abnormal; Notable for the following components:   Glucose, Bld 109 (*)    All other components within normal limits    EKG None  Radiology No results found.  Procedures Procedures    Medications Ordered in ED Medications - No data to display  ED Course/ Medical Decision Making/ A&P  Medical Decision Making Amount and/or Complexity of Data Reviewed Labs: ordered.   Jasmine Novak 52 y.o. presented today for wound check. Working DDx that I considered at this time includes, but not limited to, cellulitis, necrotic wound  Review of prior external notes: 04/17/22  Unique Tests and Interpretation: none  Discussion with Independent Historian: none  Discussion of Management of Tests: none  Risk:   Medium:  - prescription drug management  Risk Stratification Score: Medium: prescription drug management  Staffed with Jasmine Shanks, MD  R/o DDx: Necrotic wound: skin was not dusky, nor black, to suggest this  Plan: Most likely diagnosis at this time is cellulitis that is still present from november. The best course of action would be starting the patient on a course of doxycycline as the infeciton still  seems present. Patient denied any systemic symptoms and does not appear in distress in the room. Patient is able to bear weight, wiggle toes, and states sensation is intact. Pulses could not be palpated due to the swelling. Patient will be prescribed Doxycycline and educated of importance of calling [facility] for management of her symptoms. Patient was educated that if symptoms worsen, to return to ED. Patient's BP was also elevated and after speaking with patient and Jasmine Novak, we agreed patient is to take her BP meds as soon as she gets home and to make an appt with her PCP ASAP for BP management. Patient verbalized understanding of plan.  Final Clinical Impression(s) / ED Diagnoses Final diagnoses:  Chronic wound    Rx / DC Orders ED Discharge Orders          Ordered    doxycycline (VIBRAMYCIN) 100 MG capsule  2 times daily        06/09/22 0028              Jasmine Hint, PA-C 06/09/22 0056    Jasmine Shanks, MD 06/15/22 1818

## 2022-06-09 MED ORDER — DOXYCYCLINE HYCLATE 100 MG PO CAPS: 100.0000 mg | ORAL_CAPSULE | Freq: Two times a day (BID) | ORAL | 0 refills | Status: DC

## 2022-06-09 NOTE — Discharge Instructions (Addendum)
Please call wound care for further evaluation of wound. Also, your blood pressure was extremely elevated to 215/96 today. Since you are asymptomatic we are discharging you but please take your blood pressure medicine when you get home and make an appointment with your PCP since your blood pressure medication regiment needs to be re-evaluated.

## 2022-06-09 NOTE — ED Provider Notes (Signed)
I provided a substantive portion of the care of this patient.  I personally performed the entirety of the medical decision making for this encounter.       Charlesetta Shanks, MD 06/15/22 [phone]

## 2022-06-26 ENCOUNTER — Other Ambulatory Visit: Payer: Self-pay

## 2022-06-26 ENCOUNTER — Encounter (HOSPITAL_BASED_OUTPATIENT_CLINIC_OR_DEPARTMENT_OTHER): Payer: Self-pay | Admitting: Emergency Medicine

## 2022-06-26 ENCOUNTER — Emergency Department (HOSPITAL_BASED_OUTPATIENT_CLINIC_OR_DEPARTMENT_OTHER)
Admission: EM | Admit: 2022-06-26 | Discharge: 2022-06-26 | Disposition: A | Discharge status: Home / Self Care | Payer: BLUE CROSS/BLUE SHIELD | Attending: Emergency Medicine | Admitting: Emergency Medicine

## 2022-06-26 DIAGNOSIS — B370 Candidal stomatitis: Secondary | ICD-10-CM | POA: Insufficient documentation

## 2022-06-26 DIAGNOSIS — I509 Heart failure, unspecified: Secondary | ICD-10-CM | POA: Diagnosis not present

## 2022-06-26 DIAGNOSIS — I110 Hypertensive heart disease with heart failure: Secondary | ICD-10-CM | POA: Insufficient documentation

## 2022-06-26 MED ORDER — FLUCONAZOLE 200 MG PO TABS: 200.0000 mg | ORAL_TABLET | Freq: Once | ORAL | Status: DC

## 2022-06-26 MED ORDER — FLUCONAZOLE 150 MG PO TABS
150.0000 mg | ORAL_TABLET | Freq: Once | ORAL | Status: AC
  Administered 2022-06-26: 150 mg via ORAL
  Filled 2022-06-26: qty 1

## 2022-06-26 MED ORDER — FLUCONAZOLE 200 MG PO TABS: 200.0000 mg | ORAL_TABLET | Freq: Every day | ORAL | 0 refills | Status: AC

## 2022-06-26 NOTE — ED Provider Notes (Signed)
[facility] Provider Note   CSN: [id number] Arrival date & time: 06/26/22  1342     History  No chief complaint on file.   Jasmine Novak is a 52 y.o. female.  Patient presents emergency department complaining of white patches on her tongue which were noticed yesterday morning.  She states that for the past 8 days she has been taking doxycycline as a treatment for an abscess on her leg.  This is being managed by primary care.  Patient denies difficulty swallowing, shortness of breath, nausea, vomiting, diarrhea, abdominal pain.  Past medical history significant for CHF, hypertension  HPI     Home Medications Prior to Admission medications   Medication Sig Start Date End Date Taking? Authorizing Provider  fluconazole (DIFLUCAN) 200 MG tablet Take 1 tablet (200 mg total) by mouth daily for 7 days. 06/26/22 07/03/22 Yes Dorothyann Peng, PA-C  amLODipine (NORVASC) 10 MG tablet Take 1 tablet (10 mg total) by mouth daily. 08/22/18   Lanae Boast, FNP  carvedilol (COREG) 6.25 MG tablet TAKE 1 TABLET(6.25 MG) BY MOUTH TWICE DAILY WITH A MEAL 10/01/18   Lanae Boast, FNP  doxycycline (VIBRAMYCIN) 100 MG capsule Take 1 capsule (100 mg total) by mouth 2 (two) times daily. 06/09/22   Chuck Hint, PA-C  furosemide (LASIX) 40 MG tablet TAKE 1 TABLET(40 MG) BY MOUTH DAILY 10/01/18   Lanae Boast, FNP  metFORMIN (GLUCOPHAGE) 500 MG tablet Take 1 tablet (500 mg total) by mouth daily with breakfast. 08/22/18   Lanae Boast, FNP      Allergies    Lisinopril    Review of Systems   Review of Systems  Constitutional:  Negative for fever.  HENT:         White patches on tongue  Respiratory:  Negative for shortness of breath.   Gastrointestinal:  Negative for abdominal pain, nausea and vomiting.    Physical Exam Updated Vital Signs BP (!) 157/104 (BP Location: Right Arm)   Pulse 76   Temp 97.9 F (36.6 C)   Resp 18   Ht '5\' 6"'$  (1.676 m)   Wt  (!) 145.2 kg   SpO2 99%   BMI 51.65 kg/m  Physical Exam Vitals and nursing note reviewed.  HENT:     Head: Normocephalic and atraumatic.     Mouth/Throat:     Comments: Tongue with white coating consistent with candidiasis Eyes:     Conjunctiva/sclera: Conjunctivae normal.  Cardiovascular:     Rate and Rhythm: Normal rate.  Pulmonary:     Effort: Pulmonary effort is normal. No respiratory distress.  Musculoskeletal:        General: No signs of injury.     Cervical back: Normal range of motion.  Skin:    General: Skin is dry.     Comments: Open deep wound to posterior LLE calf area with yellowish drainage present. Skin peeling around the wound.  Neurological:     Mental Status: She is alert.  Psychiatric:        Speech: Speech normal.        Behavior: Behavior normal.     ED Results / Procedures / Treatments   Labs (all labs ordered are listed, but only abnormal results are displayed) Labs Reviewed - No data to display  EKG None  Radiology No results found.  Procedures Procedures    Medications Ordered in ED Medications  fluconazole (DIFLUCAN) tablet 200 mg (has no administration in time range)  ED Course/ Medical Decision Making/ A&P                             Medical Decision Making Risk Prescription drug management.   This patient presents to the ED for concern of white patches on the tongue, this involves an extensive number of treatment options, and is a complaint that carries with it a high risk of complications and morbidity.  The differential diagnosis includes candidiasis, leukoplakia, and others   Co morbidities that complicate the patient evaluation  Current doxycycline usage   Additional history obtained:  External records from outside source obtained and reviewed including family care notes with visits last week for wound care check including plans for continue doxycycline and follow-up with wound care at the end of this  month  Problem List / ED Course / Critical interventions / Medication management   I ordered medication including fluconazole for candidiasis Reevaluation of the patient after these medicines showed that the patient stayed the same I have reviewed the patients home medicines and have made adjustments as needed   Test / Admission - Considered:  Patient's physical exam is consistent with candidiasis.  This is also consistent with her recent doxycycline usage.  Plan to have patient continue doxycycline for her lower left extremity wound, currently managed by primary care with upcoming wound care appointment.  I have prescribed fluconazole to treat the current candidiasis.  Patient to follow-up with primary care for further evaluation and management as needed.        Final Clinical Impression(s) / ED Diagnoses Final diagnoses:  Oral candidiasis    Rx / DC Orders ED Discharge Orders          Ordered    fluconazole (DIFLUCAN) 200 MG tablet  Daily        06/26/22 1636              Ronny Bacon 06/26/22 1644    Davonna Belling, MD 06/26/22 2322

## 2022-06-26 NOTE — Discharge Instructions (Signed)
You were evaluated today for oral thrush. I have prescribed a medication called fluconazole for treatment. Please take as directed. I recommend follow up with your primary care team to discuss your current antibiotic therapy and to see if they recommend any changes. Please keep your upcoming appointment with wound care. If you develop any life threatening symptoms please return to the emergency department.

## 2022-06-26 NOTE — ED Triage Notes (Signed)
Pt arrived POV. Pt caox4 and ambulatory. Pt reports she has been taking doxycycline suspension for abscess on leg since 1/13 and "is concerned that she may have thrush."  Pt states yesterday morning she woke up experiencing so numbness intermittently and that her "tongue felt funny" when trying to eat along with white spots on her tongue. Denies difficulty swallowing, SOB, hives, N/V/D.

## 2023-05-07 ENCOUNTER — Emergency Department (HOSPITAL_BASED_OUTPATIENT_CLINIC_OR_DEPARTMENT_OTHER)
Admission: EM | Admit: 2023-05-07 | Discharge: 2023-05-07 | Disposition: A | Discharge status: Home / Self Care | Payer: Self-pay

## 2023-05-07 ENCOUNTER — Emergency Department (HOSPITAL_BASED_OUTPATIENT_CLINIC_OR_DEPARTMENT_OTHER): Payer: Self-pay

## 2023-05-07 ENCOUNTER — Encounter (HOSPITAL_BASED_OUTPATIENT_CLINIC_OR_DEPARTMENT_OTHER): Payer: Self-pay | Admitting: Emergency Medicine

## 2023-05-07 ENCOUNTER — Other Ambulatory Visit: Payer: Self-pay

## 2023-05-07 DIAGNOSIS — N6313 Unspecified lump in the right breast, lower outer quadrant: Secondary | ICD-10-CM | POA: Insufficient documentation

## 2023-05-07 DIAGNOSIS — I509 Heart failure, unspecified: Secondary | ICD-10-CM | POA: Diagnosis not present

## 2023-05-07 DIAGNOSIS — I110 Hypertensive heart disease with heart failure: Secondary | ICD-10-CM | POA: Diagnosis not present

## 2023-05-07 DIAGNOSIS — Z79899 Other long term (current) drug therapy: Secondary | ICD-10-CM | POA: Diagnosis not present

## 2023-05-07 LAB — COMPREHENSIVE METABOLIC PANEL
ALT: 17 U/L (ref 0–44)
AST: 16 U/L (ref 15–41)
Albumin: 3.8 g/dL (ref 3.5–5.0)
Alkaline Phosphatase: 88 U/L (ref 38–126)
Anion gap: 6 (ref 5–15)
BUN: 13 mg/dL (ref 6–20)
CO2: 27 mmol/L (ref 22–32)
Calcium: 8.9 mg/dL (ref 8.9–10.3)
Chloride: 103 mmol/L (ref 98–111)
Creatinine, Ser: 0.84 mg/dL (ref 0.44–1.00)
GFR, Estimated: >60 mL/min (ref >60)
Glucose, Bld: 91 mg/dL (ref 70–99)
Potassium: 4 mmol/L (ref 3.5–5.1)
Sodium: 136 mmol/L (ref 135–145)
Total Bilirubin: 0.3 mg/dL (ref <1.2)
Total Protein: 7.7 g/dL (ref 6.5–8.1)

## 2023-05-07 LAB — CBC WITH DIFFERENTIAL/PLATELET
Abs Immature Granulocytes: 0.03 10*3/uL (ref 0.00–0.07)
Basophils Absolute: 0.1 10*3/uL (ref 0.0–0.1)
Basophils Relative: 1 %
Eosinophils Absolute: 0.3 10*3/uL (ref 0.0–0.5)
Eosinophils Relative: 3 %
HCT: 38.6 % (ref 36.0–46.0)
Hemoglobin: 12.1 g/dL (ref 12.0–15.0)
Immature Granulocytes: 0 %
Lymphocytes Relative: 27 %
Lymphs Abs: 2 10*3/uL (ref 0.7–4.0)
MCH: 26.4 pg (ref 26.0–34.0)
MCHC: 31.3 g/dL (ref 30.0–36.0)
MCV: 84.1 fL (ref 80.0–100.0)
Monocytes Absolute: 0.8 10*3/uL (ref 0.1–1.0)
Monocytes Relative: 11 %
Neutro Abs: 4.2 10*3/uL (ref 1.7–7.7)
Neutrophils Relative %: 58 %
Platelets: 275 10*3/uL (ref 150–400)
RBC: 4.59 MIL/uL (ref 3.87–5.11)
RDW: 15.4 % (ref 11.5–15.5)
WBC: 7.3 10*3/uL (ref 4.0–10.5)
nRBC: 0 % (ref 0.0–0.2)

## 2023-05-07 MED ORDER — IOHEXOL 300 MG/ML  SOLN
75.0000 mL | Freq: Once | INTRAMUSCULAR | Status: AC | PRN
Start: 2023-05-07 — End: 2023-05-07
  Administered 2023-05-07: 75 mL via INTRAVENOUS

## 2023-05-07 NOTE — ED Provider Notes (Signed)
[place] EMERGENCY DEPARTMENT AT [facility] Provider Note   CSN: [id number] Arrival date & time: 05/07/23  1552     History No chief complaint on file.   Jasmine Novak is a 52 y.o. female with history of hypertension, CHF presents emerged from today for evaluation of right breast mass.  Patient reports has been increasingly swollen over the past month but has been more painful with occasional sharp pain for the past week.  She denies any nipple discharge.  She reports that she does have some itching.  Denies any fevers or any rash or redness to the area.  She reports that her last mammogram was over 5 years ago.  She does have a maternal aunt with breast cancer.  Denies any surgeries to the breast.  She denies any chest pain, shortness of breath, diaphoresis.  She reports that she is not as compliant/adherent to her blood pressure medication like she is post to be.  Denies any tobacco, EtOH, other drug use.  HPI     Home Medications Prior to Admission medications   Medication Sig Start Date End Date Taking? Authorizing Provider  amLODipine (NORVASC) 10 MG tablet Take 1 tablet (10 mg total) by mouth daily. 08/22/18   Mike Gip, FNP  carvedilol (COREG) 6.25 MG tablet TAKE 1 TABLET(6.25 MG) BY MOUTH TWICE DAILY WITH A MEAL 10/01/18   Mike Gip, FNP  doxycycline (VIBRAMYCIN) 100 MG capsule Take 1 capsule (100 mg total) by mouth 2 (two) times daily. 06/09/22   Netta Corrigan, PA-C  furosemide (LASIX) 40 MG tablet TAKE 1 TABLET(40 MG) BY MOUTH DAILY 10/01/18   Mike Gip, FNP  metFORMIN (GLUCOPHAGE) 500 MG tablet Take 1 tablet (500 mg total) by mouth daily with breakfast. 08/22/18   Mike Gip, FNP      Allergies    Lisinopril    Review of Systems   Review of Systems  Constitutional:  Negative for chills and fever.  Respiratory:  Negative for shortness of breath.   Cardiovascular:  Negative for chest pain.  Genitourinary:        Reports breast mass  See  HPI  Physical Exam Updated Vital Signs BP (!) 176/115 (BP Location: Left Arm)   Pulse 84   Temp 98.3 F (36.8 C) (Oral)   Resp 20   Wt (!) 145.2 kg   SpO2 97%   BMI 51.65 kg/m  Physical Exam Vitals and nursing note reviewed. Exam conducted with a chaperone present Rolly Salter, NT).  Constitutional:      General: She is not in acute distress.    Appearance: She is not ill-appearing or toxic-appearing.  Eyes:     General: No scleral icterus. Cardiovascular:     Rate and Rhythm: Normal rate.  Pulmonary:     Effort: Pulmonary effort is normal. No respiratory distress.  Chest:  Breasts:    Right: Skin change present. No nipple discharge.     Comments: I do not appreciate any lymphadenopathy however is difficult due to body habitus.  Patient has a significantly large mass, or approximate the size of a fist in the lateral lower quadrant.  She does have some peau to orange overlying skin changes noted.  Nipple is flat without any discharge, not symmetric to other side.  There is no fluctuance or induration.  Mass is hard/firm.  She does have some dimpling to the skin present as well. Abdominal:     Palpations: Abdomen is soft.     Tenderness: There is  no abdominal tenderness. There is no guarding or rebound.  Skin:    General: Skin is warm and dry.  Neurological:     Mental Status: She is alert.     ED Results / Procedures / Treatments   Labs (all labs ordered are listed, but only abnormal results are displayed) Labs Reviewed  CBC WITH DIFFERENTIAL/PLATELET  COMPREHENSIVE METABOLIC PANEL    EKG None  Radiology CT Chest W Contrast  Result Date: 05/07/2023 CLINICAL DATA:  One-month history of right breast swelling and tenderness. * Tracking Code: BO * EXAM: CT CHEST WITH CONTRAST TECHNIQUE: Multidetector CT imaging of the chest was performed during intravenous contrast administration. RADIATION DOSE REDUCTION: This exam was performed according to the departmental  dose-optimization program which includes automated exposure control, adjustment of the mA and/or kV according to patient size and/or use of iterative reconstruction technique. CONTRAST:  75mL OMNIPAQUE IOHEXOL 300 MG/ML  SOLN COMPARISON:  None Available. FINDINGS: Cardiovascular: Left atrial enlargement. No significant pericardial fluid/thickening. Great vessels are normal in course and caliber. No central pulmonary emboli. Coronary artery calcification. Mediastinum/Nodes: Imaged thyroid gland without nodules meeting criteria for imaging follow-up by size. Normal esophagus. Right axillary lymphadenopathy measuring up to 13 mm (2:51). No mediastinal or hilar lymphadenopathy. Lungs/Pleura: The central airways are patent. 3 mm subpleural right upper lobe nodule (4:63). No focal consolidation. No pneumothorax. No pleural effusion. Upper abdomen: Thickening of the left adrenal gland without discrete nodule. Musculoskeletal: No acute or abnormal lytic or blastic osseous lesions. Multilevel degenerative changes of the partially imaged thoracic and lumbar spine. 5.0 x 4.2 cm mass within the right breast (2:100) with diffuse cutaneous thickening overlying the right breast. IMPRESSION: 1. A 5.0 x 4.2 cm mass within the right breast with diffuse cutaneous thickening overlying the right breast, highly suspicious for primary breast malignancy. Recommend further evaluation with diagnostic mammogram and ultrasound. 2. Right axillary lymphadenopathy measuring up to 13 mm, suspicious for metastatic disease. 3. A 3 mm subpleural right upper lobe nodule, indeterminate. 4. Left atrial enlargement. Coronary artery calcifications. Assessment for potential risk factor modification, dietary therapy or pharmacologic therapy may be warranted, if clinically indicated. Electronically Signed   By: Agustin Cree M.D.   On: 05/07/2023 18:26    Procedures Procedures   Medications Ordered in ED Medications  iohexol (OMNIPAQUE) 300 MG/ML solution  75 mL (75 mLs Intravenous Contrast Given 05/07/23 1752)    ED Course/ Medical Decision Making/ A&P    Medical Decision Making Amount and/or Complexity of Data Reviewed Labs: ordered. Radiology: ordered.  Risk Prescription drug management.   52 y.o. female presents to the ER for evaluation of right breast mass. Differential diagnosis includes but is not limited to abscess, malignancy. Vital signs elevated BP otherwise unremarkable.  Patient reports that she has some trouble adhering to her blood pressure medications due to the timing.  Physical exam as noted above.   Patient has large right breast mass, about the size of a fist if not better.  There is no overlying erythema.  Some tenderness palpation.  Not fluctuant or indurated.  The patient's nipple is also distorted.  She has some signs of peau to orange overlying the breast as well.  Some dimpling present.  Extremely concerned for malignancy.  I doubt this is any abscess given there is no fluctuance or induration.  Will CT and order labs as well.  I independently reviewed and interpreted the patient's labs.  CBC and CMP unremarkable.  CT Chest shows 1. A 5.0  x 4.2 cm mass within the right breast with diffuse cutaneous thickening overlying the right breast, highly suspicious for primary breast malignancy. Recommend further evaluation with diagnostic mammogram and ultrasound. 2. Right axillary lymphadenopathy measuring up to 13 mm, suspicious for metastatic disease. 3. A 3 mm subpleural right upper lobe nodule, indeterminate. 4. Left atrial enlargement. Coronary artery calcifications. Assessment for potential risk factor modification, dietary therapy or pharmacologic therapy may be warranted, if clinically indicated.   I have placed an ambulatory referral to oncology and have also messaged the on-call oncologist to get her in for expedited care.  The patient currently does not have any insurance, TOC consult placed for PCP needs as well.  I  discussed with the patient at bedside that the CT scan is worrisome for cancer.  Discussed that she will need to follow-up with oncology promptly.  Discussed with her about the ambulatory referral and need to answer phone to start the process for this.  We discussed return precautions red flag symptoms.  Patient is stable  for discharge with close outpatient oncology follow-up.  We discussed the results of the labs/imaging. The plan is follow-up with oncology. We discussed strict return precautions and red flag symptoms. The patient verbalized their understanding and agrees to the plan. The patient is stable and being discharged home in good condition.  Portions of this report may have been transcribed using voice recognition software. Every effort was made to ensure accuracy; however, inadvertent computerized transcription errors may be present.    Final Clinical Impression(s) / ED Diagnoses Final diagnoses:  Mass of lower outer quadrant of right breast    Rx / DC Orders ED Discharge Orders          Ordered    Ambulatory referral to Hematology / Oncology       Comments: Large breast mass discovered, likely malignancy on CT scan.   05/07/23 1837              Achille Rich, PA-C 05/07/23 2052    Coral Spikes, DO 05/07/23 2348

## 2023-05-07 NOTE — Discharge Instructions (Addendum)
You were seen in the ER today for evaluation of your breast mass. There is a high likelihood that this is cancer. Because of this, I have placed an ambulatory referral to oncology for you to follow up with. They should call you in the next few days to schedule an appointment. If you have any concerns, new or worsening symptoms, please return to the ER for re-evaluation.

## 2023-05-07 NOTE — ED Triage Notes (Signed)
Right breast is swollen and sore,tender to touch, nipple itches . Started about a month ago with swelling and has progressed with tenderness and pain.

## 2023-05-08 ENCOUNTER — Other Ambulatory Visit: Payer: Self-pay | Admitting: *Deleted

## 2023-05-08 ENCOUNTER — Encounter: Payer: Self-pay | Admitting: *Deleted

## 2023-05-08 DIAGNOSIS — N6310 Unspecified lump in the right breast, unspecified quadrant: Secondary | ICD-10-CM

## 2023-05-08 NOTE — Progress Notes (Signed)
Mammogram and [place] of right breast and axilla ordered at [facility] per Dr Arlana Pouch.

## 2023-05-08 NOTE — Progress Notes (Signed)
PATIENT NAVIGATOR PROGRESS NOTE  Name: Jasmine Novak Date: 05/08/2023 MRN: [id number]  DOB: 11-28-1970   Reason for visit:  New patient referral  Comments:  Called and spoke with Ms Worst regarding referral from ER re: right breast mass.  Have entered order for mammogram and [place] of right breast and axilla at [facility] per Dr Arlana Pouch.  Have emailed scheduling team at [facility] as well and they are working on getting patient scheduled. Informed pt that their team will be calling.  She was tearful but appreciated call. Informed her that we will would schedule with Dr Arlana Pouch after she has biopsy. Verbalized understanding    Time spent counseling/coordinating care: 45-60 minutes

## 2023-05-18 ENCOUNTER — Ambulatory Visit: Payer: Self-pay | Admitting: Hematology and Oncology

## 2023-05-18 DIAGNOSIS — Z1211 Encounter for screening for malignant neoplasm of colon: Secondary | ICD-10-CM

## 2023-05-18 NOTE — Addendum Note (Signed)
Addended by: Meryl Dare on: 05/18/2023 02:43 PM   Modules accepted: Orders

## 2023-05-18 NOTE — Progress Notes (Signed)
[name] is a 52 y.o. female who presents to [facility] clinic today with complaint of right breast mass since October 24.    Pap Smear: Pap not smear completed today. Last Pap smear was over 5 years ago and was normal. Per patient has no history of an abnormal Pap smear. Last Pap smear result is not available in Epic.   Physical exam: Breasts Breasts symmetrical. No skin abnormalities bilateral breasts. No nipple retraction bilateral breasts. No nipple discharge bilateral breasts. No lymphadenopathy. No lumps palpated left breast. Right breast with hard mass, approximately 6-7 cm with thickened skin noted to the area.       Pelvic/Bimanual Pap is not indicated today    Smoking History: Patient has never smoked and was not referred to quit line.    Patient Navigation: Patient education provided. Access to services provided for patient through [facility] program. No interpreter provided. No transportation provided   Colorectal Cancer Screening: Per patient has never had colonoscopy completed No complaints today. FIT test given.    Breast and Cervical Cancer Risk Assessment: Patient does not have family history of breast cancer, known genetic mutations, or radiation treatment to the chest before age 84. Patient does not have history of cervical dysplasia, immunocompromised, or DES exposure in-utero.  Risk Scores as of Encounter on 05/18/2023     Jasmine Novak           5-year 0.98%   Lifetime 6.34%            Last calculated by Meryl Dare, CMA on 05/18/2023 at  2:52 PM        A: [facility] exam without pap smear Complaint of right breast mass since October, previously evaluated in the ED and found to be suspicious on exam and CT - 1. A 5.0 x 4.2 cm mass within the right breast with diffuse cutaneous thickening overlying the right breast, highly suspicious for primary breast malignancy. Recommend further evaluation with diagnostic mammogram and ultrasound. 2. Right axillary  lymphadenopathy measuring up to 13 mm, suspicious for metastatic disease. 3. A 3 mm subpleural right upper lobe nodule, indeterminate. 4. Left atrial enlargement. Coronary artery calcifications. Assessment for potential risk factor modification, dietary therapy or pharmacologic therapy may be warranted, if clinically indicated.   P: Referred patient to [facility] for a diagnostic mammogram. Appointment scheduled 05/22/2023.  Pascal Lux, NP 05/18/2023 3:09 PM

## 2023-05-18 NOTE — Patient Instructions (Signed)
Taught Jasmine Novak about self breast awareness and gave educational materials to take home. Patient did not need a Pap smear today due to last Pap smear was in  per patient.  Let her know [facility] will cover Pap smears every 5 years unless has a history of abnormal Pap smears. Referred patient to [facility] for diagnostic mammogram. Appointment scheduled for 05/18/2023. Patient aware of appointment and will be there. Let patient know will follow up with her within the next couple weeks with results. Carrah L Nelis verbalized understanding.  Pascal Lux, NP 2:50 PM

## 2023-05-18 NOTE — Addendum Note (Signed)
Addended by: Meryl Dare on: 05/18/2023 02:41 PM   Modules accepted: Orders

## 2023-05-22 ENCOUNTER — Ambulatory Visit
Admission: RE | Admit: 2023-05-22 | Discharge: 2023-05-22 | Disposition: A | Discharge status: Home / Self Care | Payer: No Typology Code available for payment source | Source: Ambulatory Visit | Attending: Oncology | Admitting: Oncology

## 2023-05-22 ENCOUNTER — Ambulatory Visit
Admission: RE | Admit: 2023-05-22 | Discharge: 2023-05-22 | Disposition: A | Discharge status: Home / Self Care | Payer: Self-pay | Source: Ambulatory Visit | Attending: Oncology | Admitting: Oncology

## 2023-05-22 ENCOUNTER — Ambulatory Visit
Admission: RE | Admit: 2023-05-22 | Discharge: 2023-05-22 | Disposition: A | Discharge status: Home / Self Care | Payer: No Typology Code available for payment source | Source: Ambulatory Visit | Attending: Oncology

## 2023-05-22 ENCOUNTER — Other Ambulatory Visit: Payer: Self-pay | Admitting: Oncology

## 2023-05-22 DIAGNOSIS — N6310 Unspecified lump in the right breast, unspecified quadrant: Secondary | ICD-10-CM

## 2023-05-22 DIAGNOSIS — R599 Enlarged lymph nodes, unspecified: Secondary | ICD-10-CM

## 2023-05-23 ENCOUNTER — Inpatient Hospital Stay
Admission: RE | Admit: 2023-05-23 | Discharge: 2023-05-23 | Discharge status: Home / Self Care | Payer: No Typology Code available for payment source | Source: Ambulatory Visit | Attending: Oncology | Admitting: Oncology

## 2023-05-23 ENCOUNTER — Ambulatory Visit
Admission: RE | Admit: 2023-05-23 | Discharge: 2023-05-23 | Disposition: A | Discharge status: Home / Self Care | Payer: No Typology Code available for payment source | Source: Ambulatory Visit | Attending: Oncology | Admitting: Oncology

## 2023-05-23 DIAGNOSIS — R599 Enlarged lymph nodes, unspecified: Secondary | ICD-10-CM

## 2023-05-23 DIAGNOSIS — N6310 Unspecified lump in the right breast, unspecified quadrant: Secondary | ICD-10-CM

## 2023-05-23 HISTORY — PX: BREAST BIOPSY: SHX20

## 2023-05-24 ENCOUNTER — Encounter: Payer: Self-pay | Admitting: *Deleted

## 2023-05-24 LAB — SURGICAL PATHOLOGY

## 2023-05-24 NOTE — Progress Notes (Signed)
PATIENT NAVIGATOR PROGRESS NOTE  Name: Jasmine Novak Date: 05/24/2023 MRN: [id number]  DOB: [birth year]   Reason for visit:  New patient appt  Comments:  Called and spoke with [name]. She had bx yesterday at [facility] and although sore is doing well.  Scheduled her with Dr Arlana Pouch on 06/06/23 at 0930. Reviewed directions to building and parking as well as contact number to call with any questions    Time spent counseling/coordinating care: 30-45 minutes

## 2023-06-02 ENCOUNTER — Telehealth: Payer: Self-pay

## 2023-06-02 NOTE — Telephone Encounter (Signed)
Attempted to contact patient regarding completing [facility] Medicaid application. Left message on voicemail requesting a return call.  

## 2023-06-06 ENCOUNTER — Telehealth: Payer: Self-pay | Admitting: *Deleted

## 2023-06-06 ENCOUNTER — Other Ambulatory Visit: Payer: Self-pay

## 2023-06-06 ENCOUNTER — Encounter: Payer: Self-pay | Admitting: *Deleted

## 2023-06-06 ENCOUNTER — Inpatient Hospital Stay: Payer: No Typology Code available for payment source

## 2023-06-06 ENCOUNTER — Encounter: Payer: Self-pay | Admitting: Oncology

## 2023-06-06 ENCOUNTER — Inpatient Hospital Stay: Payer: Self-pay | Attending: Oncology | Admitting: Oncology

## 2023-06-06 VITALS — BP 200/102 | HR 81 | Temp 98.3°F | Resp 17 | Ht 66.0 in | Wt 330.7 lb

## 2023-06-06 DIAGNOSIS — I110 Hypertensive heart disease with heart failure: Secondary | ICD-10-CM | POA: Insufficient documentation

## 2023-06-06 DIAGNOSIS — G893 Neoplasm related pain (acute) (chronic): Secondary | ICD-10-CM

## 2023-06-06 DIAGNOSIS — I1A0 Resistant hypertension: Secondary | ICD-10-CM

## 2023-06-06 DIAGNOSIS — N6310 Unspecified lump in the right breast, unspecified quadrant: Secondary | ICD-10-CM

## 2023-06-06 DIAGNOSIS — C50911 Malignant neoplasm of unspecified site of right female breast: Secondary | ICD-10-CM | POA: Diagnosis present

## 2023-06-06 DIAGNOSIS — I509 Heart failure, unspecified: Secondary | ICD-10-CM | POA: Insufficient documentation

## 2023-06-06 DIAGNOSIS — Z79899 Other long term (current) drug therapy: Secondary | ICD-10-CM | POA: Insufficient documentation

## 2023-06-06 DIAGNOSIS — R739 Hyperglycemia, unspecified: Secondary | ICD-10-CM

## 2023-06-06 LAB — CBC WITH DIFFERENTIAL/PLATELET
Abs Immature Granulocytes: 0.02 10*3/uL (ref 0.00–0.07)
Basophils Absolute: 0 10*3/uL (ref 0.0–0.1)
Basophils Relative: 0 %
Eosinophils Absolute: 0.2 10*3/uL (ref 0.0–0.5)
Eosinophils Relative: 2 %
HCT: 38.2 % (ref 36.0–46.0)
Hemoglobin: 12.3 g/dL (ref 12.0–15.0)
Immature Granulocytes: 0 %
Lymphocytes Relative: 19 %
Lymphs Abs: 1.6 10*3/uL (ref 0.7–4.0)
MCH: 26.6 pg (ref 26.0–34.0)
MCHC: 32.2 g/dL (ref 30.0–36.0)
MCV: 82.7 fL (ref 80.0–100.0)
Monocytes Absolute: 0.7 10*3/uL (ref 0.1–1.0)
Monocytes Relative: 9 %
Neutro Abs: 5.5 10*3/uL (ref 1.7–7.7)
Neutrophils Relative %: 70 %
Platelets: 266 10*3/uL (ref 150–400)
RBC: 4.62 MIL/uL (ref 3.87–5.11)
RDW: 15.3 % (ref 11.5–15.5)
WBC: 8 10*3/uL (ref 4.0–10.5)
nRBC: 0 % (ref 0.0–0.2)

## 2023-06-06 LAB — HEMOGLOBIN A1C
Hgb A1c MFr Bld: 6.5 % — ABNORMAL HIGH (ref 4.8–5.6)
Mean Plasma Glucose: 140 mg/dL

## 2023-06-06 LAB — COMPREHENSIVE METABOLIC PANEL
ALT: 18 U/L (ref 0–44)
AST: 18 U/L (ref 15–41)
Albumin: 4.1 g/dL (ref 3.5–5.0)
Alkaline Phosphatase: 103 U/L (ref 38–126)
Anion gap: 7 (ref 5–15)
BUN: 13 mg/dL (ref 6–20)
CO2: 25 mmol/L (ref 22–32)
Calcium: 8.9 mg/dL (ref 8.9–10.3)
Chloride: 105 mmol/L (ref 98–111)
Creatinine, Ser: 0.8 mg/dL (ref 0.44–1.00)
GFR, Estimated: >60 mL/min (ref >60)
Glucose, Bld: 104 mg/dL — ABNORMAL HIGH (ref 70–99)
Potassium: 3.9 mmol/L (ref 3.5–5.1)
Sodium: 137 mmol/L (ref 135–145)
Total Bilirubin: 0.5 mg/dL (ref 0.0–1.2)
Total Protein: 8.1 g/dL (ref 6.5–8.1)

## 2023-06-06 LAB — CEA (ACCESS): CEA (CHCC): 1.12 ng/mL (ref 0.00–5.00)

## 2023-06-06 LAB — LACTATE DEHYDROGENASE: LDH: 312 U/L — ABNORMAL HIGH (ref 98–192)

## 2023-06-06 MED ORDER — TRAMADOL HCL 50 MG PO TABS: 50.0000 mg | ORAL_TABLET | Freq: Two times a day (BID) | ORAL | 0 refills | Status: DC | PRN

## 2023-06-06 NOTE — Telephone Encounter (Signed)
Called no answer left message of patient scheduled appts.

## 2023-06-06 NOTE — Progress Notes (Signed)
 Referral placed to Social Worker pertaining to [employer]. Food Nurse, adult given.

## 2023-06-06 NOTE — Progress Notes (Signed)
 [facility]  ONCOLOGY CONSULT NOTE   PATIENT NAME: Jasmine Novak   MR#: [id number] DOB: [birth year]  DATE OF SERVICE: 06/06/2023   REFERRING PHYSICIAN  Jasmine Cornish, PA-C, Emergency Medicine.   Patient Care Team: Jasmine Novak as PCP - General (Physician Assistant)    CHIEF COMPLAINT/ PURPOSE OF CONSULTATION:   Newly diagnosed inflammatory carcinoma of the right breast.  ASSESSMENT & PLAN:   Jasmine Novak is a 52 y.o. lady with a past medical history of hypertension, was referred to our clinic for newly diagnosed inflammatory carcinoma of right breast.  Inflammatory carcinoma of breast, right (HCC) Newly diagnosed with right breast mass (6 cm) and biopsy confirmed malignancy. Estrogen receptor weakly positive at 10%, progesterone receptor negative, and HER2 negative. Left breast has a small mass (6-8 mm) not concerning for malignancy. No lymph node involvement on biopsy but ultrasound was concerning for 3-4 right axillary lymph nodes.   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  -Discussed tentative staging, prognosis, plan of care, treatment options.  Reviewed NCCN guidelines.  -Request admitted for urgent PET/CT for staging and to rule out metastatic disease.  Will also obtain echocardiogram in preparation for chemotherapy planning.  -Provided no evidence of metastatic disease, plan is to treat her with neoadjuvant chemoimmunotherapy using KEYNOTE-522 protocol (carboplatin  and paclitaxel  plus Keytruda  for 4 cycles followed by intervention, Cytoxan  and Keytruda  for 4 cycles followed by 9 more doses of adjuvant Keytruda ).  -She does have an appointment with Dr. Ebbie for surgical consultation on 06/16/2023.    -I will see patient on 06/19/2023 to discuss results and finalize treatment plan.  Resistant hypertension Patient has a history of hypertension and was previously misdiagnosed with congestive  heart failure. Currently on Amlodipine  once daily, Carvedilol  twice daily, Spironolactone four times daily, and Furosemide  once daily. -Continue current medications. -Emphasize the importance of good blood pressure control, especially in the context of planned chemotherapy.  Cancer associated pain Patient reports discomfort and tenderness in the area of the right breast mass. -Provide prescription for Tramadol  for pain management as needed, with instructions for potential side effects (drowsiness, constipation) and the use of a stool softener if constipation occurs.    I reviewed lab results and outside records for this visit and discussed relevant results with the patient. Diagnosis, plan of care and treatment options were also discussed in detail with the patient. Opportunity provided to ask questions and answers provided to her apparent satisfaction. Provided instructions to call our clinic with any problems, questions or concerns prior to return visit. I recommended to continue follow-up with PCP and sub-specialists. She verbalized understanding and agreed with the plan. No barriers to learning was detected.  NCCN guidelines have been consulted in the planning of this patient's care.  Jasmine Patten, MD  06/06/2023 10:00 AM  [facility] - A DEPT OF [name] [address] [place] [address] Dept: [phone] Dept Fax: [phone]   HISTORY OF PRESENTING ILLNESS:   Discussed the use of AI scribe software for clinical note transcription with the patient, who gave verbal consent to proceed.   I have reviewed her chart and materials related to her cancer extensively and collaborated history with the patient. Summary of oncologic history is as follows:  She has not noticed any nipple discharge. The patient has not been getting regular mammograms and has not seen a primary doctor recently.  She reports lump  in her right breast that she noticed in mid-October. The lump has been causing discomfort, particularly when she is moving or wearing a bra. The patient reports that the area around the lump is tender and warm to the touch. She has been taking ibuprofen  to manage the discomfort. She is right-handed and has been trying to avoid using her right arm due to the discomfort from the lump.   Since pain and swelling persisted, she presented to the ED on 05/07/2023.  A CT chest with contrast was obtained on 05/07/2023 which showed 5 x 4.2 cm mass within the right breast with diffuse cutaneous thickening overlying the right breast, highly suspicious for primary breast malignancy.  Right axillary lymphadenopathy measuring up to 13 mm, suspicious for metastatic disease.  A 3 mm subpleural right upper lobe lung nodule, indeterminate.  Coronary artery calcifications and left atrial enlargement was also noted.  ED provider reached out to our clinic for further assistance and management.  We arranged for diagnostic mammogram, ultrasound and biopsy.  On 05/22/2023, diagnostic mammogram showed 5.3 cm highly suspicious mass in the right breast associated with adjacent inflammation and overlying skin thickening consistent with inflammatory breast carcinoma.  3-4 abnormal right axillary lymph nodes, suspicious for metastatic disease.  2 adjacent benign clusters of cysts in the left breast.  No evidence of left breast malignancy.  Ultrasound showed similar findings.  On 05/23/2023 she underwent core needle biopsy of right breast mass.  Pathology showed invasive ductal carcinoma, mitotic score 3, grade 3.  ER weakly positive at 10%.  PR negative, HER2/neu was 2+ by IHC, negative by FISH.  High Ki-67 index of 80%.  With these findings, she presented to clinic to establish care with us  on 06/06/2023.  Request submitted for PET/CT and echocardiogram.  Provided no evidence of metastatic disease, plan is to treat her with neoadjuvant  chemoimmunotherapy using KEYNOTE-522 protocol (carboplatin  and paclitaxel  plus Keytruda  for 4 cycles followed by intervention, Cytoxan  and Keytruda  for 4 cycles followed by 9 more doses of adjuvant Keytruda ).  INTERVAL HISTORY:  She reports ongoing discomfort in the right breast with tenderness, decreased mobility, persistent swelling and pain.    The patient also mentions that she has been experiencing indigestion symptoms and that burping seems to alleviate the discomfort. She has an appointment with a cardiologist in April. 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   She is planning a trip to [place] starting on 06/21/2023 for a week.  MEDICAL HISTORY:  Past Medical History:  Diagnosis Date   Herpes simplex    states hasn't had outbreak since she was young   Hypertension     SURGICAL HISTORY: Past Surgical History:  Procedure Laterality Date   BREAST BIOPSY Right 05/23/2023   US  RT BREAST BX W LOC DEV 1ST LESION IMG BX SPEC US  GUIDE 05/23/2023 [facility] MAMMOGRAP[facility]   CARPAL TUNNEL RELEASE  1996 & 1999   bilateral wrist    SOCIAL HISTORY: Social History   Socioeconomic History   Marital status: Single    Spouse name: Not on file   Number of children: Not on file   Years of education: Not on file   Highest education level: Not on file  Occupational History   Not on file  Tobacco Use   Smoking status: Never   Smokeless tobacco: Never  Vaping Use   Vaping status: Never Used  Substance and Sexual Activity   Alcohol use: No  Comment: wine once a month   Drug use: No   Sexual activity: Yes    Birth control/protection: Condom  Other Topics Concern   Not on file  Social History Narrative   Not on file   Social Drivers of Health   Financial Resource Strain: Not on file  Food Insecurity: Food Insecurity Present (06/06/2023)   Hunger Vital Sign    Worried About Running Out of Food in the Last Year: Often true    Ran Out of Food in the Last  Year: Often true  Transportation Needs: No Transportation Needs (06/06/2023)   PRAPARE - Administrator, Civil Service (Medical): No    Lack of Transportation (Non-Medical): No  Physical Activity: Not on file  Stress: Not on file  Social Connections: Not on file  Intimate Partner Violence: Not At Risk (06/06/2023)   Humiliation, Afraid, Rape, and Kick questionnaire    Fear of Current or Ex-Partner: No    Emotionally Abused: No    Physically Abused: No    Sexually Abused: No    FAMILY HISTORY: Family History  Problem Relation Age of Onset   Stroke Father    Heart disease Daughter    Diabetes Maternal Grandmother     ALLERGIES:  She is allergic to lisinopril .  MEDICATIONS:  Current Outpatient Medications  Medication Sig Dispense Refill   amLODipine  (NORVASC ) 10 MG tablet Take 1 tablet (10 mg total) by mouth daily. 90 tablet 0   carvedilol  (COREG ) 12.5 MG tablet Take 12.5 mg by mouth.     furosemide  (LASIX ) 40 MG tablet TAKE 1 TABLET(40 MG) BY MOUTH DAILY 30 tablet 2   ibuprofen  (ADVIL ) 200 MG tablet Take 200 mg by mouth every 6 (six) hours as needed.     irbesartan (AVAPRO) 300 MG tablet Take by mouth.     spironolactone (ALDACTONE) 25 MG tablet Take by mouth.     No current facility-administered medications for this visit.    REVIEW OF SYSTEMS:    Review of Systems - Oncology  All other pertinent systems were reviewed with the patient and are negative.  PHYSICAL EXAMINATION:  ECOG PERFORMANCE STATUS: 1 - Symptomatic but completely ambulatory  Vitals:   06/06/23 0903 06/06/23 0920  BP: (!) 199/101 (!) 200/102  Pulse:    Resp:    Temp:    SpO2:     Filed Weights   06/06/23 0902  Weight: (!) 330 lb 11.2 oz (150 kg)    Physical Exam Constitutional:      General: She is not in acute distress.    Appearance: Normal appearance.  HENT:     Head: Normocephalic and atraumatic.  Eyes:     General: No scleral icterus.    Conjunctiva/sclera:  Conjunctivae normal.  Cardiovascular:     Rate and Rhythm: Normal rate and regular rhythm.     Heart sounds: Normal heart sounds.  Pulmonary:     Effort: Pulmonary effort is normal.     Breath sounds: Normal breath sounds.  Chest:     Comments: At least 6.5 cm firm mass palpable in the lower, outer quadrant of right breast. Peau d'Orange skin changes noted.  No inversion of nipple.  Right axillary fullness noted without definite lymphadenopathy palpable.  Left breast exam was unremarkable without definite lumps. Abdominal:     General: There is no distension.  Musculoskeletal:     Right lower leg: No edema.     Left lower leg: No edema.  Neurological:  General: No focal deficit present.     Mental Status: She is alert and oriented to person, place, and time.  Psychiatric:        Mood and Affect: Mood normal.        Behavior: Behavior normal.        Thought Content: Thought content normal.     LABORATORY DATA:   I have reviewed the data as listed.  Results for orders placed or performed in visit on 06/06/23  Hemoglobin A1c  Result Value Ref Range   Hgb A1c MFr Bld 6.5 (H) 4.8 - 5.6 %   Mean Plasma Glucose 140 mg/dL  CEA ([facility] ONLY  Result Value Ref Range   CEA ([facility]) 1.12 0.00 - 5.00 ng/mL  Cancer antigen 15-3  Result Value Ref Range   CA 15-3 21.3 0.0 - 25.0 U/mL  CA 27.29  Result Value Ref Range   CA 27.29 21.0 0.0 - 38.6 U/mL  Lactate dehydrogenase  Result Value Ref Range   LDH 312 (H) 98 - 192 U/L  Comprehensive metabolic panel  Result Value Ref Range   Sodium 137 135 - 145 mmol/L   Potassium 3.9 3.5 - 5.1 mmol/L   Chloride 105 98 - 111 mmol/L   CO2 25 22 - 32 mmol/L   Glucose, Bld 104 (H) 70 - 99 mg/dL   BUN 13 6 - 20 mg/dL   Creatinine, Ser 9.19 0.44 - 1.00 mg/dL   Calcium 8.9 8.9 - 89.6 mg/dL   Total Protein 8.1 6.5 - 8.1 g/dL   Albumin 4.1 3.5 - 5.0 g/dL   AST 18 15 - 41 U/L   ALT 18 0 - 44 U/L   Alkaline Phosphatase 103 38 - 126 U/L    Total Bilirubin 0.5 0.0 - 1.2 mg/dL   GFR, Estimated >39 >39 mL/min   Anion gap 7 5 - 15  CBC with Differential/Platelet  Result Value Ref Range   WBC 8.0 4.0 - 10.5 K/uL   RBC 4.62 3.87 - 5.11 MIL/uL   Hemoglobin 12.3 12.0 - 15.0 g/dL   HCT 61.7 63.9 - 53.9 %   MCV 82.7 80.0 - 100.0 fL   MCH 26.6 26.0 - 34.0 pg   MCHC 32.2 30.0 - 36.0 g/dL   RDW 84.6 88.4 - 84.4 %   Platelets 266 150 - 400 K/uL   nRBC 0.0 0.0 - 0.2 %   Neutrophils Relative % 70 %   Neutro Abs 5.5 1.7 - 7.7 K/uL   Lymphocytes Relative 19 %   Lymphs Abs 1.6 0.7 - 4.0 K/uL   Monocytes Relative 9 %   Monocytes Absolute 0.7 0.1 - 1.0 K/uL   Eosinophils Relative 2 %   Eosinophils Absolute 0.2 0.0 - 0.5 K/uL   Basophils Relative 0 %   Basophils Absolute 0.0 0.0 - 0.1 K/uL   Immature Granulocytes 0 %   Abs Immature Granulocytes 0.02 0.00 - 0.07 K/uL     RADIOGRAPHIC STUDIES:  I have personally reviewed the radiological images as listed and agree with the findings in the report.  US  AXILLARY NODE CORE BIOPSY RIGHT Addendum Date: 05/25/2023 ADDENDUM REPORT: 05/25/2023 13:41 ADDENDUM: Pathology revealed GRADE III INVASIVE DUCTAL CARCINOMA, LYMPHOVASCULAR INVASION: NOT IDENTIFIED, OTHER FINDINGS: NONE, of the RIGHT breast, 9 o'clock 9 cmfn, (coil clip). This was found to be concordant by Dr. Norman Hopper. Pathology revealed ONE BENIGN LYMPH NODE, NEGATIVE FOR CARCINOMA of the RIGHT axilla, (open coil clip). This was found to be concordant by  Dr. Norman Hopper. Pathology results were discussed with the patient by telephone. The patient reported doing well after the biopsies with tenderness at the sites. Post biopsy instructions and care were reviewed and questions were answered. The patient was encouraged to call [facility] for any additional concerns. My direct phone number was provided. Medical oncology consultation has been arranged with Dr. Chinita Novak at [facility]  [facility] on June 06, 2023. Surgical consultation has been arranged with Dr. Donnice Bury at [facility] on June 16, 2023. Pathology results reported by Hendricks Benders, RN on 05/25/2023. Electronically Signed   By: Norman Hopper M.D.   On: 05/25/2023 13:41   Result Date: 05/25/2023 CLINICAL DATA:  Here for ultrasound-guided biopsy of a mass in the right breast as well as a lymph node in the right axilla. EXAM: ULTRASOUND GUIDED RIGHT BREAST CORE NEEDLE BIOPSY ULTRASOUND-GUIDED RIGHT AXILLARY LYMPH NODE CORE NEEDLE BIOPSY COMPARISON:  Previous exam(s). PROCEDURE: I met with the patient and we discussed the procedure of ultrasound-guided biopsy, including benefits and alternatives. We discussed the high likelihood of a successful procedure. We discussed the risks of the procedure, including infection, bleeding, tissue injury, clip migration, and inadequate sampling. Informed written consent was given. The usual time-out protocol was performed immediately prior to the procedure. Site 1 (right breast mass)- Lesion quadrant: Upper outer quadrant Using sterile technique and 1% Lidocaine  as local anesthetic, under direct ultrasound visualization, a 12 gauge spring-loaded device was used to perform biopsy of an irregular hypoechoic mass in the right breast at 9 o'clock 9 cm from the nipple using a lateral approach. At the conclusion of the procedure a coil shaped tissue marker clip was deployed into the biopsy cavity. Follow up 2 view mammogram was performed and dictated separately. Site 2 (right axillary lymph node)- Lesion quadrant: Upper outer quadrant Using sterile technique and 1% Lidocaine  as local anesthetic, under direct ultrasound visualization, a 12 gauge spring-loaded device was used to perform biopsy of an enlarged right axillary lymph node using a lateral approach. At the conclusion of the procedure an open coil shaped tissue marker clip was deployed into the biopsy cavity.  Follow up 2 view mammogram was performed and dictated separately. IMPRESSION: Ultrasound guided biopsy of a mass in the right breast and ultrasound-guided biopsy of a lymph node in the right axilla. No apparent complications. Electronically Signed: By: Norman Hopper M.D. On: 05/23/2023 15:32   US  RT BREAST BX W LOC DEV 1ST LESION IMG BX SPEC US  GUIDE Addendum Date: 05/25/2023 ADDENDUM REPORT: 05/25/2023 13:41 ADDENDUM: Pathology revealed GRADE III INVASIVE DUCTAL CARCINOMA, LYMPHOVASCULAR INVASION: NOT IDENTIFIED, OTHER FINDINGS: NONE, of the RIGHT breast, 9 o'clock 9 cmfn, (coil clip). This was found to be concordant by Dr. Norman Hopper. Pathology revealed ONE BENIGN LYMPH NODE, NEGATIVE FOR CARCINOMA of the RIGHT axilla, (open coil clip). This was found to be concordant by Dr. Norman Hopper. Pathology results were discussed with the patient by telephone. The patient reported doing well after the biopsies with tenderness at the sites. Post biopsy instructions and care were reviewed and questions were answered. The patient was encouraged to call [facility] for any additional concerns. My direct phone number was provided. Medical oncology consultation has been arranged with Dr. Chinita Novak at [facility] on June 06, 2023. Surgical consultation has been arranged with Dr. Donnice Bury at [facility] on June 16, 2023.  Pathology results reported by Hendricks Benders, RN on 05/25/2023. Electronically Signed   By: Norman Hopper M.D.   On: 05/25/2023 13:41   Result Date: 05/25/2023 CLINICAL DATA:  Here for ultrasound-guided biopsy of a mass in the right breast as well as a lymph node in the right axilla. EXAM: ULTRASOUND GUIDED RIGHT BREAST CORE NEEDLE BIOPSY ULTRASOUND-GUIDED RIGHT AXILLARY LYMPH NODE CORE NEEDLE BIOPSY COMPARISON:  Previous exam(s). PROCEDURE: I met with the patient and we discussed the procedure of  ultrasound-guided biopsy, including benefits and alternatives. We discussed the high likelihood of a successful procedure. We discussed the risks of the procedure, including infection, bleeding, tissue injury, clip migration, and inadequate sampling. Informed written consent was given. The usual time-out protocol was performed immediately prior to the procedure. Site 1 (right breast mass)- Lesion quadrant: Upper outer quadrant Using sterile technique and 1% Lidocaine  as local anesthetic, under direct ultrasound visualization, a 12 gauge spring-loaded device was used to perform biopsy of an irregular hypoechoic mass in the right breast at 9 o'clock 9 cm from the nipple using a lateral approach. At the conclusion of the procedure a coil shaped tissue marker clip was deployed into the biopsy cavity. Follow up 2 view mammogram was performed and dictated separately. Site 2 (right axillary lymph node)- Lesion quadrant: Upper outer quadrant Using sterile technique and 1% Lidocaine  as local anesthetic, under direct ultrasound visualization, a 12 gauge spring-loaded device was used to perform biopsy of an enlarged right axillary lymph node using a lateral approach. At the conclusion of the procedure an open coil shaped tissue marker clip was deployed into the biopsy cavity. Follow up 2 view mammogram was performed and dictated separately. IMPRESSION: Ultrasound guided biopsy of a mass in the right breast and ultrasound-guided biopsy of a lymph node in the right axilla. No apparent complications. Electronically Signed: By: Norman Hopper M.D. On: 05/23/2023 15:32   MM CLIP PLACEMENT RIGHT Result Date: 05/23/2023 CLINICAL DATA:  Status post ultrasound-guided biopsy of a mass in the right breast and a lymph node in the right axilla. EXAM: 3D DIAGNOSTIC RIGHT MAMMOGRAM POST ULTRASOUND BIOPSY COMPARISON:  Previous exam(s). FINDINGS: 3D Mammographic images were obtained following ultrasound guided biopsy of a mass in the right  breast and a lymph node in the right axilla. The biopsy marking clips are in expected position at the sites of biopsy. IMPRESSION: Appropriate positioning of the coil shaped biopsy marking clip at the site of biopsy in the right breast. Appropriate positioning of the open coil shaped biopsy marking clip at the site of biopsy in the right axilla. Final Assessment: Post Procedure Mammograms for Marker Placement Electronically Signed   By: Norman Hopper M.D.   On: 05/23/2023 15:34   MS 3D DIAG MAMMO BILAT BR (aka MM) Result Date: 05/22/2023 CLINICAL DATA:  Patient with palpable mass in the right breast with associated tenderness/pain. EXAM: DIGITAL DIAGNOSTIC BILATERAL MAMMOGRAM WITH TOMOSYNTHESIS AND CAD; ULTRASOUND LEFT BREAST LIMITED; ULTRASOUND RIGHT BREAST LIMITED TECHNIQUE: Bilateral digital diagnostic mammography and breast tomosynthesis was performed. The images were evaluated with computer-aided detection. ; Targeted ultrasound examination of the left breast was performed.; Targeted ultrasound examination of the right breast was performed COMPARISON:  None. ACR Breast Density Category b: There are scattered areas of fibroglandular density. FINDINGS: In the right breast, there is a large mass centered in the lateral retroareolar breast, measuring 5-6 cm, associated with trabecular and overlying skin thickening. This corresponds to the palpable mass. Left, there is a small lobulated mass  in the 6 o'clock position, 8-9 mm in long axis suspected to be a cluster of cysts. No other left breast masses, no architectural distortion and no suspicious calcifications. On physical exam, there is right breast skin thickening with an underlying firm mass in the retroareolar breast. Targeted right breast ultrasound is performed, showing a large hypoechoic mass with irregular and ill-defined margins at 9 o'clock, 9 cm the nipple, measuring 5.3 x 4.5 x 5.1 cm. The surrounding tissue demonstrates increased echogenicity  consistent with inflammation. There is overlying skin thickening. Right axilla ultrasound shows 3-4 abnormal lymph nodes with cortical thickness is to 5 mm. Targeted left breast ultrasound is performed, showing the 2 adjacent clusters of cysts at 6 o'clock, 1 cm from the nipple, the largest measuring 8 x 4 x 6 mm, consistent with the lobulated mass seen mammographically. No solid masses or suspicious findings. IMPRESSION: 1. 5.3 cm highly suspicious mass in the right breast associated with adjacent inflammation and overlying skin thickening consistent with inflammatory breast carcinoma. 2. 3-4 abnormal right axillary lymph nodes suspicious for metastatic disease. 3. 2 adjacent benign clusters of cysts in the left breast. No evidence of left breast malignancy. RECOMMENDATION: 1. Ultrasound-guided core needle biopsy of the right breast mass. 2. Ultrasound-guided core needle biopsy of 1 of the abnormal right axillary lymph nodes. I have discussed the findings and recommendations with the patient. If applicable, a reminder letter will be sent to the patient regarding the next appointment. BI-RADS CATEGORY  5: Highly suggestive of malignancy. Electronically Signed   By: Alm Parkins M.D.   On: 05/22/2023 10:25   US  LIMITED ULTRASOUND INCLUDING AXILLA RIGHT BREAST Result Date: 05/22/2023 CLINICAL DATA:  Patient with palpable mass in the right breast with associated tenderness/pain. EXAM: DIGITAL DIAGNOSTIC BILATERAL MAMMOGRAM WITH TOMOSYNTHESIS AND CAD; ULTRASOUND LEFT BREAST LIMITED; ULTRASOUND RIGHT BREAST LIMITED TECHNIQUE: Bilateral digital diagnostic mammography and breast tomosynthesis was performed. The images were evaluated with computer-aided detection. ; Targeted ultrasound examination of the left breast was performed.; Targeted ultrasound examination of the right breast was performed COMPARISON:  None. ACR Breast Density Category b: There are scattered areas of fibroglandular density. FINDINGS: In the  right breast, there is a large mass centered in the lateral retroareolar breast, measuring 5-6 cm, associated with trabecular and overlying skin thickening. This corresponds to the palpable mass. Left, there is a small lobulated mass in the 6 o'clock position, 8-9 mm in long axis suspected to be a cluster of cysts. No other left breast masses, no architectural distortion and no suspicious calcifications. On physical exam, there is right breast skin thickening with an underlying firm mass in the retroareolar breast. Targeted right breast ultrasound is performed, showing a large hypoechoic mass with irregular and ill-defined margins at 9 o'clock, 9 cm the nipple, measuring 5.3 x 4.5 x 5.1 cm. The surrounding tissue demonstrates increased echogenicity consistent with inflammation. There is overlying skin thickening. Right axilla ultrasound shows 3-4 abnormal lymph nodes with cortical thickness is to 5 mm. Targeted left breast ultrasound is performed, showing the 2 adjacent clusters of cysts at 6 o'clock, 1 cm from the nipple, the largest measuring 8 x 4 x 6 mm, consistent with the lobulated mass seen mammographically. No solid masses or suspicious findings. IMPRESSION: 1. 5.3 cm highly suspicious mass in the right breast associated with adjacent inflammation and overlying skin thickening consistent with inflammatory breast carcinoma. 2. 3-4 abnormal right axillary lymph nodes suspicious for metastatic disease. 3. 2 adjacent benign clusters of cysts in  the left breast. No evidence of left breast malignancy. RECOMMENDATION: 1. Ultrasound-guided core needle biopsy of the right breast mass. 2. Ultrasound-guided core needle biopsy of 1 of the abnormal right axillary lymph nodes. I have discussed the findings and recommendations with the patient. If applicable, a reminder letter will be sent to the patient regarding the next appointment. BI-RADS CATEGORY  5: Highly suggestive of malignancy. Electronically Signed   By: Alm Parkins M.D.   On: 05/22/2023 10:25   US  LIMITED ULTRASOUND INCLUDING AXILLA LEFT BREAST  Result Date: 05/22/2023 CLINICAL DATA:  Patient with palpable mass in the right breast with associated tenderness/pain. EXAM: DIGITAL DIAGNOSTIC BILATERAL MAMMOGRAM WITH TOMOSYNTHESIS AND CAD; ULTRASOUND LEFT BREAST LIMITED; ULTRASOUND RIGHT BREAST LIMITED TECHNIQUE: Bilateral digital diagnostic mammography and breast tomosynthesis was performed. The images were evaluated with computer-aided detection. ; Targeted ultrasound examination of the left breast was performed.; Targeted ultrasound examination of the right breast was performed COMPARISON:  None. ACR Breast Density Category b: There are scattered areas of fibroglandular density. FINDINGS: In the right breast, there is a large mass centered in the lateral retroareolar breast, measuring 5-6 cm, associated with trabecular and overlying skin thickening. This corresponds to the palpable mass. Left, there is a small lobulated mass in the 6 o'clock position, 8-9 mm in long axis suspected to be a cluster of cysts. No other left breast masses, no architectural distortion and no suspicious calcifications. On physical exam, there is right breast skin thickening with an underlying firm mass in the retroareolar breast. Targeted right breast ultrasound is performed, showing a large hypoechoic mass with irregular and ill-defined margins at 9 o'clock, 9 cm the nipple, measuring 5.3 x 4.5 x 5.1 cm. The surrounding tissue demonstrates increased echogenicity consistent with inflammation. There is overlying skin thickening. Right axilla ultrasound shows 3-4 abnormal lymph nodes with cortical thickness is to 5 mm. Targeted left breast ultrasound is performed, showing the 2 adjacent clusters of cysts at 6 o'clock, 1 cm from the nipple, the largest measuring 8 x 4 x 6 mm, consistent with the lobulated mass seen mammographically. No solid masses or suspicious findings. IMPRESSION: 1. 5.3 cm  highly suspicious mass in the right breast associated with adjacent inflammation and overlying skin thickening consistent with inflammatory breast carcinoma. 2. 3-4 abnormal right axillary lymph nodes suspicious for metastatic disease. 3. 2 adjacent benign clusters of cysts in the left breast. No evidence of left breast malignancy. RECOMMENDATION: 1. Ultrasound-guided core needle biopsy of the right breast mass. 2. Ultrasound-guided core needle biopsy of 1 of the abnormal right axillary lymph nodes. I have discussed the findings and recommendations with the patient. If applicable, a reminder letter will be sent to the patient regarding the next appointment. BI-RADS CATEGORY  5: Highly suggestive of malignancy. Electronically Signed   By: Alm Parkins M.D.   On: 05/22/2023 10:25   CT Chest W Contrast Result Date: 05/07/2023 CLINICAL DATA:  One-month history of right breast swelling and tenderness. * Tracking Code: BO * EXAM: CT CHEST WITH CONTRAST TECHNIQUE: Multidetector CT imaging of the chest was performed during intravenous contrast administration. RADIATION DOSE REDUCTION: This exam was performed according to the departmental dose-optimization program which includes automated exposure control, adjustment of the mA and/or kV according to patient size and/or use of iterative reconstruction technique. CONTRAST:  75mL OMNIPAQUE  IOHEXOL  300 MG/ML  SOLN COMPARISON:  None Available. FINDINGS: Cardiovascular: Left atrial enlargement. No significant pericardial fluid/thickening. Great vessels are normal in course and caliber. No central pulmonary  emboli. Coronary artery calcification. Mediastinum/Nodes: Imaged thyroid  gland without nodules meeting criteria for imaging follow-up by size. Normal esophagus. Right axillary lymphadenopathy measuring up to 13 mm (2:51). No mediastinal or hilar lymphadenopathy. Lungs/Pleura: The central airways are patent. 3 mm subpleural right upper lobe nodule (4:63). No focal  consolidation. No pneumothorax. No pleural effusion. Upper abdomen: Thickening of the left adrenal gland without discrete nodule. Musculoskeletal: No acute or abnormal lytic or blastic osseous lesions. Multilevel degenerative changes of the partially imaged thoracic and lumbar spine. 5.0 x 4.2 cm mass within the right breast (2:100) with diffuse cutaneous thickening overlying the right breast. IMPRESSION: 1. A 5.0 x 4.2 cm mass within the right breast with diffuse cutaneous thickening overlying the right breast, highly suspicious for primary breast malignancy. Recommend further evaluation with diagnostic mammogram and ultrasound. 2. Right axillary lymphadenopathy measuring up to 13 mm, suspicious for metastatic disease. 3. A 3 mm subpleural right upper lobe nodule, indeterminate. 4. Left atrial enlargement. Coronary artery calcifications. Assessment for potential risk factor modification, dietary therapy or pharmacologic therapy may be warranted, if clinically indicated. Electronically Signed   By: Limin  Xu M.D.   On: 05/07/2023 18:26    Orders Placed This Encounter  Procedures   NM PET Image Initial (PI) Skull Base To Thigh    Standing Status:   Future    Expected Date:   06/13/2023    Expiration Date:   06/05/2024    If indicated for the ordered procedure, I authorize the administration of a radiopharmaceutical per Radiology protocol:   Yes    Is the patient pregnant?:   No    Preferred imaging location?:   [facility]   CBC with Differential/Platelet    Standing Status:   Future    Expiration Date:   06/05/2024   Comprehensive metabolic panel    Standing Status:   Future    Expiration Date:   06/05/2024   Lactate dehydrogenase    Standing Status:   Future    Expiration Date:   06/05/2024   CA 27.29    Standing Status:   Future    Expiration Date:   06/05/2024   Cancer antigen 15-3    Standing Status:   Future    Expiration Date:   06/05/2024   [facility]A (Access)-[facility] ONLY    Standing Status:    Future    Expiration Date:   06/05/2024   ECHOCARDIOGRAM COMPLETE    Standing Status:   Future    Expiration Date:   06/05/2024    Where should this test be performed:   [facility]    Perflutren  DEFINITY  (image enhancing agent) should be administered unless hypersensitivity or allergy exist:   Administer Perflutren     Reason for exam-Echo:   Chemo  Z09    CODE STATUS:  Code Status History     Date Active Date Inactive Code Status Order ID Comments User Context   11/13/2015 0949 11/15/2015 1650 Full Code [id number]  Rosario Leatrice FERNS, MD Inpatient       Future Appointments  Date Time Provider Department Center  06/13/2023  4:30 PM [facility] PET CT 1 [facility] [place]     I spent a total of 60 minutes during this encounter with the patient including review of chart and various tests results, discussions about plan of care and coordination of care plan.  This document was completed utilizing speech recognition software. Grammatical errors, random word insertions, pronoun errors, and incomplete sentences are an occasional consequence of  this system due to software limitations, ambient noise, and hardware issues. Any formal questions or concerns about the content, text or information contained within the body of this dictation should be directly addressed to the provider for clarification.

## 2023-06-06 NOTE — Progress Notes (Signed)
 PATIENT NAVIGATOR PROGRESS NOTE  Name: Jasmine Novak Date: 06/06/2023 MRN: [id number]  DOB: [birth year]   Reason for visit:  New Patient appt  Comments:  Met with Jasmine Novak during appt with Dr Autumn  Scheduled for ECHO 06/13/23 with arrival time of 1:45pm Scheduled for PET scan 06/13/23 with arrival time of 4:15pm. Written instructions given Reviewed Port placement with patient with demonstration Pt given Journeys notebook with disease specific information Given FMLA cover sheet to return with FMLA paperwork Given contact information to call with any issues or questions She is meeting with SW today     Time spent counseling/coordinating care: > 60 minutes

## 2023-06-06 NOTE — Progress Notes (Signed)
 [facility] Clinical Social Work  Initial Assessment   Ralene L Formanek is a 52 y.o. year old female presenting alone. Clinical Social Work was referred by new patient protocol for assessment of psychosocial needs.   SDOH (Social Determinants of Health) assessments performed: No SDOH Interventions    Flowsheet Row Office Visit from 06/06/2023 in [facility]  SDOH Interventions   Food Insecurity Interventions AMB Referral  Housing Interventions Intervention Not Indicated  Transportation Interventions Intervention Not Indicated  Utilities Interventions Intervention Not Indicated       SDOH Screenings   Food Insecurity: Food Insecurity Present (06/06/2023)  Housing: Unknown (06/06/2023)  Transportation Needs: No Transportation Needs (06/06/2023)  Utilities: Not At Risk (06/06/2023)  Depression (PHQ2-9): Low Risk  (06/06/2023)  Tobacco Use: Low Risk  (05/18/2023)     Distress Screen completed: No     No data to display            Family/Social Information:  Housing Arrangement: patient lives with her great-nephew (6) and grand-daughter (1) . Patient lives in the same apartment complex as her daughter and grandson. Family members/support persons in your life? Family Transportation concerns: no  Employment: Working full time patient is an early automotive engineer. Patient loves her job and is important to keep working as long as possible.  Income source: Employment Financial concerns:  Patient has some concerns with paying for medical treatment not covered  by the [facility] program.  And potential loss of income due to treatment.  Type of concern: Medical bills and Food Food access concerns: yes, reported on SDOH screening.  Religious or spiritual practice: Not known Services Currently in place:  Employment, Transportation, Insurance   Coping/ Adjustment to diagnosis: Patient understands treatment plan and what happens next?  yes Concerns about diagnosis and/or treatment:  Physical Effects of Chemotherapy.  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 Informed patient of the support team roles and support services at [facility]. Provided CSW contact information and encouraged patient to call with any questions or concerns. Provided patient information on talking to children about cancer and resources for children in the home.  Provided patient with information about Financial Assistance Available for patient to apply for.   Follow Up Plan: Patient will contact CSW with any support or resource needs Patient verbalizes understanding of plan: Yes  Lizbeth Sprague, LCSW Clinical Social Worker [facility]

## 2023-06-07 LAB — CANCER ANTIGEN 27.29: CA 27.29: 21 U/mL (ref 0.0–38.6)

## 2023-06-07 LAB — CANCER ANTIGEN 15-3: CA 15-3: 21.3 U/mL (ref 0.0–25.0)

## 2023-06-08 ENCOUNTER — Telehealth: Payer: Self-pay

## 2023-06-08 ENCOUNTER — Encounter: Payer: Self-pay | Admitting: Oncology

## 2023-06-08 DIAGNOSIS — R739 Hyperglycemia, unspecified: Secondary | ICD-10-CM | POA: Insufficient documentation

## 2023-06-08 DIAGNOSIS — G893 Neoplasm related pain (acute) (chronic): Secondary | ICD-10-CM | POA: Insufficient documentation

## 2023-06-08 DIAGNOSIS — C50911 Malignant neoplasm of unspecified site of right female breast: Secondary | ICD-10-CM | POA: Insufficient documentation

## 2023-06-08 NOTE — Assessment & Plan Note (Signed)
 Newly diagnosed with right breast mass (6 cm) and biopsy confirmed malignancy. Estrogen receptor weakly positive at 10%, progesterone receptor negative, and HER2 negative. Left breast has a small mass (6-8 mm) not concerning for malignancy. No lymph node involvement on biopsy but ultrasound was concerning for 3-4 right axillary lymph nodes.   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  -Discussed tentative staging, prognosis, plan of care, treatment options.  Reviewed NCCN guidelines.  -Request admitted for urgent PET/CT for staging and to rule out metastatic disease.  Will also obtain echocardiogram in preparation for chemotherapy planning.  -Provided no evidence of metastatic disease, plan is to treat her with neoadjuvant chemoimmunotherapy using KEYNOTE-522 protocol (carboplatin  and paclitaxel  plus Keytruda  for 4 cycles followed by intervention, Cytoxan  and Keytruda  for 4 cycles followed by 9 more doses of adjuvant Keytruda ).  -She does have an appointment with Dr. Ebbie for surgical consultation on 06/16/2023.    -I will see patient on 06/19/2023 to discuss results and finalize treatment plan.

## 2023-06-08 NOTE — Assessment & Plan Note (Signed)
 Patient reports discomfort and tenderness in the area of the right breast mass. -Provide prescription for Tramadol  for pain management as needed, with instructions for potential side effects (drowsiness, constipation) and the use of a stool softener if constipation occurs.

## 2023-06-08 NOTE — Assessment & Plan Note (Signed)
 Patient has a history of hypertension and was previously misdiagnosed with congestive heart failure. Currently on Amlodipine once daily, Carvedilol twice daily, Spironolactone four times daily, and Furosemide once daily. -Continue current medications. -Emphasize the importance of good blood pressure control, especially in the context of planned chemotherapy.

## 2023-06-08 NOTE — Telephone Encounter (Signed)
 Left message on voicemail requesting a return call. Attempted to contact patient regarding [employer] [employer].

## 2023-06-08 NOTE — Telephone Encounter (Signed)
 [facility] CSW Progress Note  Clinical Social Worker  attempted to reach patient via phone  to provide contact for [employer] Nurse S. McGill to discuss coverage and application.  Marguerita Merles, LCSW Clinical Social Worker [facility]

## 2023-06-09 ENCOUNTER — Telehealth: Payer: Self-pay

## 2023-06-09 NOTE — Telephone Encounter (Signed)
 Left message on voicemail requesting a return call regarding [facility] Medicaid application.

## 2023-06-13 ENCOUNTER — Encounter (HOSPITAL_COMMUNITY)
Admission: RE | Admit: 2023-06-13 | Discharge: 2023-06-13 | Disposition: A | Discharge status: Home / Self Care | Payer: No Typology Code available for payment source | Source: Ambulatory Visit | Attending: Oncology | Admitting: Oncology

## 2023-06-13 ENCOUNTER — Ambulatory Visit (HOSPITAL_COMMUNITY)
Admission: RE | Admit: 2023-06-13 | Discharge: 2023-06-13 | Disposition: A | Discharge status: Home / Self Care | Payer: No Typology Code available for payment source | Source: Ambulatory Visit | Attending: Oncology | Admitting: Oncology

## 2023-06-13 DIAGNOSIS — N200 Calculus of kidney: Secondary | ICD-10-CM | POA: Insufficient documentation

## 2023-06-13 DIAGNOSIS — C50911 Malignant neoplasm of unspecified site of right female breast: Secondary | ICD-10-CM

## 2023-06-13 DIAGNOSIS — R590 Localized enlarged lymph nodes: Secondary | ICD-10-CM | POA: Insufficient documentation

## 2023-06-13 DIAGNOSIS — I517 Cardiomegaly: Secondary | ICD-10-CM | POA: Insufficient documentation

## 2023-06-13 DIAGNOSIS — Z0189 Encounter for other specified special examinations: Secondary | ICD-10-CM

## 2023-06-13 LAB — ECHOCARDIOGRAM COMPLETE
Area-P 1/2: 3.54 cm2
S' Lateral: 3.2 cm

## 2023-06-13 LAB — GLUCOSE, CAPILLARY: Glucose-Capillary: 78 mg/dL (ref 70–99)

## 2023-06-13 MED ORDER — FLUDEOXYGLUCOSE F - 18 (FDG) INJECTION
16.0000 | Freq: Once | INTRAVENOUS | Status: AC
  Administered 2023-06-13: 15.88 via INTRAVENOUS

## 2023-06-15 ENCOUNTER — Other Ambulatory Visit: Payer: Self-pay | Admitting: Oncology

## 2023-06-15 ENCOUNTER — Encounter: Payer: Self-pay | Admitting: Oncology

## 2023-06-15 NOTE — Progress Notes (Signed)
 START ON PATHWAY REGIMEN - Breast     Cycles 1 through 4: A cycle is every 21 days:     Pembrolizumab       Paclitaxel       Carboplatin       Filgrastim-xxxx    Cycles 5 through 8: A cycle is every 21 days:     Pembrolizumab       Doxorubicin       Cyclophosphamide       Pegfilgrastim -xxxx   **Always confirm dose/schedule in your pharmacy ordering system**  Patient Characteristics: Preoperative or Nonsurgical Candidate, M0 (Clinical Staging), Up to cT4c, Any N, M0, Neoadjuvant Therapy followed by Surgery, Invasive Disease, Chemotherapy, HER2 Negative, ER Negative, Platinum Therapy Indicated and Candidate for Checkpoint Inhibitor Therapeutic Status: Preoperative or Nonsurgical Candidate, M0 (Clinical Staging) AJCC M Category: cM0 AJCC Grade: G3 ER Status: Negative (-) AJCC 8 Stage Grouping: IIIC HER2 Status: Negative (-) AJCC T Category: cT4b AJCC N Category: cN2 PR Status: Negative (-) Breast Surgical Plan: Neoadjuvant Therapy followed by Surgery Intent of Therapy: Curative Intent, Discussed with Patient

## 2023-06-16 ENCOUNTER — Other Ambulatory Visit: Payer: Self-pay

## 2023-06-16 ENCOUNTER — Other Ambulatory Visit: Payer: Self-pay | Admitting: General Surgery

## 2023-06-19 ENCOUNTER — Encounter: Payer: Self-pay | Admitting: Oncology

## 2023-06-19 ENCOUNTER — Other Ambulatory Visit: Payer: No Typology Code available for payment source

## 2023-06-19 ENCOUNTER — Inpatient Hospital Stay: Payer: MEDICAID | Attending: Oncology | Admitting: Oncology

## 2023-06-19 ENCOUNTER — Other Ambulatory Visit: Payer: Self-pay | Admitting: *Deleted

## 2023-06-19 VITALS — BP 168/80 | HR 74 | Temp 98.3°F | Resp 18 | Ht 66.0 in | Wt 323.4 lb

## 2023-06-19 DIAGNOSIS — G893 Neoplasm related pain (acute) (chronic): Secondary | ICD-10-CM

## 2023-06-19 DIAGNOSIS — C50911 Malignant neoplasm of unspecified site of right female breast: Secondary | ICD-10-CM

## 2023-06-19 DIAGNOSIS — Z79620 Long term (current) use of immunosuppressive biologic: Secondary | ICD-10-CM | POA: Diagnosis not present

## 2023-06-19 DIAGNOSIS — Z5111 Encounter for antineoplastic chemotherapy: Secondary | ICD-10-CM | POA: Insufficient documentation

## 2023-06-19 DIAGNOSIS — I1A0 Resistant hypertension: Secondary | ICD-10-CM

## 2023-06-19 DIAGNOSIS — C50411 Malignant neoplasm of upper-outer quadrant of right female breast: Secondary | ICD-10-CM | POA: Diagnosis present

## 2023-06-19 MED ORDER — ONDANSETRON HCL 8 MG PO TABS: 8.0000 mg | ORAL_TABLET | Freq: Three times a day (TID) | ORAL | 1 refills | Status: DC | PRN

## 2023-06-19 MED ORDER — PROCHLORPERAZINE MALEATE 10 MG PO TABS: 10.0000 mg | ORAL_TABLET | Freq: Four times a day (QID) | ORAL | 1 refills | Status: DC | PRN

## 2023-06-19 MED ORDER — LIDOCAINE-PRILOCAINE 2.5-2.5 % EX CREA: TOPICAL_CREAM | CUTANEOUS | 3 refills | Status: DC

## 2023-06-19 MED ORDER — DEXAMETHASONE 4 MG PO TABS: ORAL_TABLET | ORAL | 1 refills | Status: DC

## 2023-06-19 NOTE — Assessment & Plan Note (Signed)
 Patient reports discomfort and tenderness in the area of the right breast mass. -Previously provided prescription for Tramadol for pain management as needed, with instructions for potential side effects (drowsiness, constipation) and the use of a stool softener if constipation occurs.

## 2023-06-19 NOTE — Assessment & Plan Note (Addendum)
 Newly diagnosed with right breast mass (6 cm) and biopsy confirmed malignancy. Estrogen receptor weakly positive at 10%, progesterone receptor negative, and HER2 negative. Left breast has a small mass (6-8 mm) not concerning for malignancy. No lymph node involvement on biopsy but ultrasound was concerning for 3-4 right axillary lymph nodes.   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  -Discussed tentative staging, prognosis, plan of care, treatment options.  Reviewed NCCN guidelines.  On 06/13/2023, staging PET/CT showed intense metabolic activity within the right breast mass consistent with primary breast carcinoma.  Hypermetabolic right axillary and subpectoralis lymph nodes consistent with local nodal metastasis.  No evidence of distant metastatic disease.    Given no evidence of metastatic disease, plan is to treat her with neoadjuvant chemoimmunotherapy using KEYNOTE-522 protocol (carboplatin  and paclitaxel  plus Keytruda  for 4 cycles followed by Adriamycin , Cytoxan  and Keytruda  for 4 cycles followed by 9 more doses of adjuvant Keytruda ).  Echocardiogram on 06/13/2023 showed preserved LV EF.  -She did have an appointment with Dr. Ebbie for surgical consultation on 06/16/2023.    -She is going on a vacation [place] from 06/21/2023 until 06/26/2023.  Once she returns, she will have Port-A-Cath placement following which we will plan initiation of treatments.

## 2023-06-19 NOTE — Assessment & Plan Note (Signed)
 Patient has a history of hypertension and was previously misdiagnosed with congestive heart failure. Currently on Amlodipine once daily, Carvedilol twice daily, Spironolactone four times daily, and Furosemide once daily. -Continue current medications. -Emphasize the importance of good blood pressure control, especially in the context of planned chemotherapy.

## 2023-06-19 NOTE — Progress Notes (Signed)
 [facility]  ONCOLOGY CLINIC PROGRESS NOTE   Patient Care Team: Sharl Tully [name] as PCP - General (Physician Assistant)  PATIENT NAME: Jasmine Novak   MR#: [id number] DOB: 1971/02/07  Date of visit: 06/19/2023   ASSESSMENT & PLAN:   Jasmine Novak is a 53 y.o. lady with a past medical history of hypertension, was referred to our clinic in December 2024 for newly diagnosed inflammatory carcinoma of right breast.   Inflammatory carcinoma of breast, right (HCC) Newly diagnosed with right breast mass (6 cm) and biopsy confirmed malignancy. Estrogen receptor weakly positive at 10%, progesterone receptor negative, and HER2 negative. Left breast has a small mass (6-8 mm) not concerning for malignancy. No lymph node involvement on biopsy but ultrasound was concerning for 3-4 right axillary lymph nodes.   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  -Discussed tentative staging, prognosis, plan of care, treatment options.  Reviewed NCCN guidelines.  On 06/13/2023, staging PET/CT showed intense metabolic activity within the right breast mass consistent with primary breast carcinoma.  Hypermetabolic right axillary and subpectoralis lymph nodes consistent with local nodal metastasis.  No evidence of distant metastatic disease.    Given no evidence of metastatic disease, plan is to treat her with neoadjuvant chemoimmunotherapy using KEYNOTE-522 protocol (carboplatin  and paclitaxel  plus Keytruda  for 4 cycles followed by Adriamycin , Cytoxan  and Keytruda  for 4 cycles followed by 9 more doses of adjuvant Keytruda ).  Echocardiogram on 06/13/2023 showed preserved LV EF.  -She did have an appointment with Dr. Ebbie for surgical consultation on 06/16/2023.    -She is going on a vacation to [place] from 06/21/2023 until 06/26/2023.  Once she returns, she will have Port-A-Cath placement following which we will plan initiation of  treatments.    Resistant hypertension Patient has a history of hypertension and was previously misdiagnosed with congestive heart failure. Currently on Amlodipine  once daily, Carvedilol  twice daily, Spironolactone four times daily, and Furosemide  once daily. -Continue current medications. -Emphasize the importance of good blood pressure control, especially in the context of planned chemotherapy.  Cancer associated pain Patient reports discomfort and tenderness in the area of the right breast mass. -Previously provided prescription for Tramadol  for pain management as needed, with instructions for potential side effects (drowsiness, constipation) and the use of a stool softener if constipation occurs.    I reviewed lab results and outside records for this visit and discussed relevant results with the patient. Diagnosis, plan of care and treatment options were also discussed in detail with the patient. Opportunity provided to ask questions and answers provided to her apparent satisfaction. Provided instructions to call our clinic with any problems, questions or concerns prior to return visit. I recommended to continue follow-up with PCP and sub-specialists. She verbalized understanding and agreed with the plan.   NCCN guidelines have been consulted in the planning of this patient's care.  I spent a total of 30 minutes during this encounter with the patient including review of chart and various tests results, discussions about plan of care and coordination of care plan.   [name], MD  06/19/2023 4:17 PM  [facility] - A DEPT OF [place]. [facility] [address] 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  Current Treatment: Plan for neoadjuvant chemotherapy using KEYNOTE-522 protocol (carboplatin  and paclitaxel  plus Keytruda  for 4 cycles followed by Adriamycin , Cytoxan  and Keytruda  for 4 cycles followed by 9 more doses of adjuvant Keytruda ).  INTERVAL HISTORY:    Discussed the use of AI scribe software for clinical note transcription with the patient, who gave verbal consent to proceed.   Jasmine Novak is here today for repeat clinical assessment.   I have reviewed the past medical history, past surgical history, social history and family history with the patient and they are unchanged from previous note.  HISTORY OF PRESENT ILLNESS:   ONCOLOGY HISTORY:  The patient has not been getting regular mammograms previously and has not seen a primary doctor recently.    She reports lump in her right breast that she noticed in mid-October 2024. The lump was causing discomfort, particularly when she is moving or wearing a bra. The patient reports that the area around the lump is tender and warm to the touch. She has been taking ibuprofen  to manage the discomfort. She is right-handed and has been trying to avoid using her right arm due to the discomfort from the lump.    Since pain and swelling persisted, she presented to the ED on 05/07/2023.  A CT chest with contrast was obtained on 05/07/2023 which showed 5 x 4.2 cm mass within the right breast with diffuse cutaneous thickening overlying the right breast, highly suspicious for primary breast malignancy.  Right axillary lymphadenopathy measuring up to 13 mm, suspicious for metastatic disease.  A 3 mm subpleural right upper lobe lung nodule, indeterminate. Coronary artery calcifications and left atrial enlargement was also noted.   ED provider reached out to our clinic for further assistance and management.  We arranged for diagnostic mammogram, ultrasound and biopsy.   On 05/22/2023, diagnostic mammogram showed 5.3 cm highly suspicious mass  in the right breast associated with adjacent inflammation and overlying skin thickening consistent with inflammatory breast carcinoma.  3-4 abnormal right axillary lymph nodes, suspicious for metastatic disease.  2 adjacent benign clusters of cysts in the left breast.  No evidence of left breast malignancy.  Ultrasound showed similar findings.   On 05/23/2023 she underwent core needle biopsy of right breast mass.  Pathology showed invasive ductal carcinoma, mitotic score 3, grade 3.  ER weakly positive at 10%.  PR negative, HER2/neu was 2+ by IHC, negative by FISH.  High Ki-67 index of 80%.   With these findings, she presented to clinic to establish care with us  on 06/06/2023.  Request submitted for PET/CT and echocardiogram.   On 06/13/2023, staging PET/CT showed intense metabolic activity within the right breast mass consistent with primary breast carcinoma.  Hypermetabolic right axillary and subpectoralis lymph nodes consistent with local nodal metastasis.  No evidence of distant metastatic disease.    Given no evidence of metastatic disease, plan is to treat her with neoadjuvant chemoimmunotherapy using KEYNOTE-522 protocol (carboplatin  and paclitaxel  plus Keytruda  for 4 cycles followed by Adriamycin , Cytoxan  and Keytruda  for 4 cycles followed by 9 more doses of adjuvant Keytruda ).   Oncology History  Inflammatory carcinoma of breast, right (HCC)  06/08/2023 Initial Diagnosis   Inflammatory carcinoma of breast, right (HCC)   06/15/2023 Cancer Staging   Staging form: Breast, AJCC 8th Edition - Clinical: Stage IIIC (cT4b, cN2, cM0, G3, ER+, PR-, HER2-) - Signed by Autumn Millman, MD on 06/15/2023 Histologic grading system: 3 grade system   07/04/2023 -  Chemotherapy  Patient is on Treatment Plan : BREAST Pembrolizumab  (200) D1 + Carboplatin  (5) D1 + Paclitaxel  (80) D1,8,15 q21d X 4 cycles / Pembrolizumab  (200) D1 + AC D1 q21d x 4 cycles         REVIEW OF SYSTEMS:   Review of Systems -  Oncology  All other pertinent systems were reviewed with the patient and are negative.  ALLERGIES: She is allergic to lisinopril .  MEDICATIONS:  Current Outpatient Medications  Medication Sig Dispense Refill   amLODipine  (NORVASC ) 10 MG tablet Take 1 tablet (10 mg total) by mouth daily. 90 tablet 0   carvedilol  (COREG ) 12.5 MG tablet Take 12.5 mg by mouth.     furosemide  (LASIX ) 40 MG tablet TAKE 1 TABLET(40 MG) BY MOUTH DAILY 30 tablet 2   ibuprofen  (ADVIL ) 200 MG tablet Take 200 mg by mouth every 6 (six) hours as needed.     irbesartan (AVAPRO) 300 MG tablet Take by mouth.     spironolactone (ALDACTONE) 25 MG tablet Take by mouth.     traMADol  (ULTRAM ) 50 MG tablet Take 1 tablet (50 mg total) by mouth every 12 (twelve) hours as needed. 60 tablet 0   dexamethasone  (DECADRON ) 4 MG tablet Take 2 tablets once a day for 3 days after chemotherapy. Take with food. 30 tablet 1   lidocaine -prilocaine  (EMLA ) cream Apply to affected area once 30 g 3   ondansetron  (ZOFRAN ) 8 MG tablet Take 1 tablet (8 mg total) by mouth every 8 (eight) hours as needed for nausea or vomiting. Start on the third day after chemotherapy. 30 tablet 1   prochlorperazine  (COMPAZINE ) 10 MG tablet Take 1 tablet (10 mg total) by mouth every 6 (six) hours as needed for nausea or vomiting. 30 tablet 1   No current facility-administered medications for this visit.     VITALS:   Blood pressure (!) 168/80, pulse 74, temperature 98.3 F (36.8 C), temperature source Oral, resp. rate 18, height 5' 6 (1.676 m), weight (!) 323 lb 6.4 oz (146.7 kg), last menstrual period 01/17/2022, SpO2 100%.  Wt Readings from Last 3 Encounters:  06/19/23 (!) 323 lb 6.4 oz (146.7 kg)  06/06/23 (!) 330 lb 11.2 oz (150 kg)  05/07/23 (!) 320 lb (145.2 kg)    Body mass index is 52.2 kg/m.  Performance status (ECOG): 1 - Symptomatic but completely ambulatory  PHYSICAL EXAM:   Physical Exam Constitutional:      General: She is not in acute  distress.    Appearance: Normal appearance.  HENT:     Head: Normocephalic and atraumatic.  Eyes:     General: No scleral icterus.    Conjunctiva/sclera: Conjunctivae normal.  Cardiovascular:     Rate and Rhythm: Normal rate and regular rhythm.     Heart sounds: Normal heart sounds.  Pulmonary:     Effort: Pulmonary effort is normal.     Breath sounds: Normal breath sounds.  Chest:     Comments: Breast exam deferred today.  Please see notes dated 06/06/2023. Abdominal:     General: There is no distension.  Musculoskeletal:     Right lower leg: No edema.     Left lower leg: No edema.  Neurological:     General: No focal deficit present.     Mental Status: She is alert and oriented to person, place, and time.  Psychiatric:        Mood and Affect: Mood normal.        Behavior: Behavior normal.  Thought Content: Thought content normal.      LABORATORY DATA:   I have reviewed the data as listed.  No results found for any visits on 06/19/23.  Lab Results  Component Value Date   WBC 8.0 06/06/2023   NEUTROABS 5.5 06/06/2023   HGB 12.3 06/06/2023   HCT 38.2 06/06/2023   MCV 82.7 06/06/2023   PLT 266 06/06/2023       Component Value Date/Time   NA 137 06/06/2023 1038   NA 140 05/23/2018 0929   K 3.9 06/06/2023 1038   CL 105 06/06/2023 1038   CO2 25 06/06/2023 1038   GLUCOSE 104 (H) 06/06/2023 1038   BUN 13 06/06/2023 1038   BUN 12 05/23/2018 0929   CREATININE 0.80 06/06/2023 1038   CREATININE 1.00 06/27/2016 1355   CALCIUM 8.9 06/06/2023 1038   PROT 8.1 06/06/2023 1038   PROT 7.8 05/23/2018 0929   ALBUMIN 4.1 06/06/2023 1038   ALBUMIN 4.1 05/23/2018 0929   AST 18 06/06/2023 1038   ALT 18 06/06/2023 1038   ALKPHOS 103 06/06/2023 1038   BILITOT 0.5 06/06/2023 1038   BILITOT 0.2 05/23/2018 0929   GFRNONAA >60 06/06/2023 1038   GFRNONAA >89 06/08/2016 0938   GFRAA 84 05/23/2018 0929   GFRAA >89 06/08/2016 0938    RADIOGRAPHIC STUDIES:  I have  personally reviewed the radiological images as listed and agree with the findings in the report.  NM PET Image Initial (PI) Skull Base To Thigh Result Date: 06/14/2023 CLINICAL DATA:  Initial treatment strategy for RIGHT breast carcinoma. EXAM: NUCLEAR MEDICINE PET SKULL BASE TO THIGH TECHNIQUE: 15.9 mCi F-18 FDG was injected intravenously. Full-ring PET imaging was performed from the skull base to thigh after the radiotracer. CT data was obtained and used for attenuation correction and anatomic localization. Fasting blood glucose: 78 mg/dl COMPARISON:  None Available. FINDINGS: NECK: No hypermetabolic lymph nodes in the neck. CHEST: Intense metabolic activity within a 6.8 cm RIGHT breast mass with SUV max equal 35.8. Several hypermetabolic RIGHT axial lymph nodes. For example 17 mm node on image 61 with SUV max equal 5.3. There are 2 axillary hypermetabolic nodes in 1 sub pectoralis node. Hypermetabolic sub pectoralis node on image 67. No central thoracic hypermetabolic lymph nodes. No suspicious pulmonary nodules Incidental CT findings: None. ABDOMEN/PELVIS: No abnormal hypermetabolic activity within the liver, pancreas, adrenal glands, or spleen. No hypermetabolic lymph nodes in the abdomen or pelvis. Incidental CT findings: Nonobstructing RIGHT renal calculus measures 10 mm. Uterus and adnexa unremarkable. SKELETON: No focal hypermetabolic activity to suggest skeletal metastasis. Incidental CT findings: None. IMPRESSION: 1. Intense metabolic activity within the RIGHT breast mass consistent with primary breast carcinoma. 2. Hypermetabolic RIGHT axillary and sub pectoralis lymph nodes consistent with local nodal metastasis. 3. No evidence of distant metastatic disease. 4. Nonobstructing RIGHT renal calculus. Electronically Signed   By: Jackquline Boxer M.D.   On: 06/14/2023 10:12   ECHOCARDIOGRAM COMPLETE Result Date: 06/13/2023    ECHOCARDIOGRAM REPORT   Patient Name:   KERISSA COIA Date of Exam: 06/13/2023  Medical Rec #:  [id number]     Height:       66.0 in Accession #:    [id number]    Weight:       330.7 lb Date of Birth:  1970-08-08     BSA:          2.476 m Patient Age:    52 years      BP:  174/81 mmHg Patient Gender: F             HR:           81 bpm. Exam Location:  Outpatient Procedure: 2D Echo, Cardiac Doppler and Color Doppler Indications:    Chemo Z09  History:        Patient has no prior history of Echocardiogram examinations.  Sonographer:    Tinnie Gosling RDCS Referring Phys: [id number] Kycen Spalla IMPRESSIONS  1. Left ventricular ejection fraction, by estimation, is 55 to 60%. The left ventricle has normal function. Left ventricular endocardial border not optimally defined to evaluate regional wall motion. There is moderate concentric left ventricular hypertrophy. Left ventricular diastolic parameters are indeterminate.  2. Right ventricular systolic function is normal. The right ventricular size is not well visualized. Tricuspid regurgitation signal is inadequate for assessing PA pressure.  3. The mitral valve was not well visualized. No evidence of mitral valve regurgitation. No evidence of mitral stenosis.  4. The aortic valve was not well visualized. Aortic valve regurgitation is not visualized. No aortic stenosis is present.  5. The inferior vena cava is normal in size with greater than 50% respiratory variability, suggesting right atrial pressure of 3 mmHg. Comparison(s): No significant change from prior study. Conclusion(s)/Recommendation(s): Technically challenging study, limited acoustic windows. Valves not well seen but are normal by Doppler assessment. Grossly normal LV function, though regional wall motion abnormalities cannot be excluded based on this study. FINDINGS  Left Ventricle: Left ventricular ejection fraction, by estimation, is 55 to 60%. The left ventricle has normal function. Left ventricular endocardial border not optimally defined to evaluate regional wall motion. The  left ventricular internal cavity size was normal in size. There is moderate concentric left ventricular hypertrophy. Left ventricular diastolic parameters are indeterminate. Right Ventricle: The right ventricular size is not well visualized. Right vetricular wall thickness was not well visualized. Right ventricular systolic function is normal. Tricuspid regurgitation signal is inadequate for assessing PA pressure. Left Atrium: Left atrial size was not well visualized. Right Atrium: Right atrial size was not well visualized. Pericardium: Trivial pericardial effusion is present. Mitral Valve: The mitral valve was not well visualized. No evidence of mitral valve regurgitation. No evidence of mitral valve stenosis. Tricuspid Valve: The tricuspid valve is not well visualized. Tricuspid valve regurgitation is not demonstrated. No evidence of tricuspid stenosis. Aortic Valve: The aortic valve was not well visualized. Aortic valve regurgitation is not visualized. No aortic stenosis is present. Pulmonic Valve: The pulmonic valve was not well visualized. Pulmonic valve regurgitation is not visualized. Aorta: The aortic root and ascending aorta are structurally normal, with no evidence of dilitation. Venous: The inferior vena cava is normal in size with greater than 50% respiratory variability, suggesting right atrial pressure of 3 mmHg. IAS/Shunts: The interatrial septum was not well visualized.  LEFT VENTRICLE PLAX 2D LVIDd:         4.90 cm   Diastology LVIDs:         3.20 cm   LV e' medial:    6.53 cm/s LV PW:         1.60 cm   LV E/e' medial:  12.8 LV IVS:        1.70 cm   LV e' lateral:   8.38 cm/s LVOT diam:     2.40 cm   LV E/e' lateral: 10.0 LV SV:         67 LV SV Index:   27 LVOT Area:  4.52 cm  RIGHT VENTRICLE             IVC RV S prime:     16.40 cm/s  IVC diam: 1.70 cm TAPSE (M-mode): 2.3 cm LEFT ATRIUM           Index LA diam:      4.30 cm 1.74 cm/m LA Vol (A4C): 49.0 ml 19.79 ml/m  AORTIC VALVE LVOT Vmax:    76.30 cm/s LVOT Vmean:  49.700 cm/s LVOT VTI:    0.148 m  AORTA Ao Root diam: 3.00 cm Ao Asc diam:  3.30 cm MITRAL VALVE MV Area (PHT): 3.54 cm    SHUNTS MV Decel Time: 214 msec    Systemic VTI:  0.15 m MV E velocity: 83.40 cm/s  Systemic Diam: 2.40 cm MV A velocity: 80.00 cm/s MV E/A ratio:  1.04 Shelda Bruckner MD Electronically signed by Shelda Bruckner MD Signature Date/Time: 06/13/2023/4:04:04 PM    Final    US  AXILLARY NODE CORE BIOPSY RIGHT Addendum Date: 05/25/2023 ADDENDUM REPORT: 05/25/2023 13:41 ADDENDUM: Pathology revealed GRADE III INVASIVE DUCTAL CARCINOMA, LYMPHOVASCULAR INVASION: NOT IDENTIFIED, OTHER FINDINGS: NONE, of the RIGHT breast, 9 o'clock 9 cmfn, (coil clip). This was found to be concordant by Dr. Norman Hopper. Pathology revealed ONE BENIGN LYMPH NODE, NEGATIVE FOR CARCINOMA of the RIGHT axilla, (open coil clip). This was found to be concordant by Dr. Norman Hopper. Pathology results were discussed with the patient by telephone. The patient reported doing well after the biopsies with tenderness at the sites. Post biopsy instructions and care were reviewed and questions were answered. The patient was encouraged to call [facility] for any additional concerns. My direct phone number was provided. Medical oncology consultation has been arranged with Dr. [name] at [facility] on June 06, 2023. Surgical consultation has been arranged with Dr. Donnice Bury at [facility] on June 16, 2023. Pathology results reported by Hendricks Benders, RN on 05/25/2023. Electronically Signed   By: Norman Hopper M.D.   On: 05/25/2023 13:41   Result Date: 05/25/2023 CLINICAL DATA:  Here for ultrasound-guided biopsy of a mass in the right breast as well as a lymph node in the right axilla. EXAM: ULTRASOUND GUIDED RIGHT BREAST CORE NEEDLE BIOPSY ULTRASOUND-GUIDED RIGHT AXILLARY LYMPH NODE CORE NEEDLE BIOPSY  COMPARISON:  Previous exam(s). PROCEDURE: I met with the patient and we discussed the procedure of ultrasound-guided biopsy, including benefits and alternatives. We discussed the high likelihood of a successful procedure. We discussed the risks of the procedure, including infection, bleeding, tissue injury, clip migration, and inadequate sampling. Informed written consent was given. The usual time-out protocol was performed immediately prior to the procedure. Site 1 (right breast mass)- Lesion quadrant: Upper outer quadrant Using sterile technique and 1% Lidocaine  as local anesthetic, under direct ultrasound visualization, a 12 gauge spring-loaded device was used to perform biopsy of an irregular hypoechoic mass in the right breast at 9 o'clock 9 cm from the nipple using a lateral approach. At the conclusion of the procedure a coil shaped tissue marker clip was deployed into the biopsy cavity. Follow up 2 view mammogram was performed and dictated separately. Site 2 (right axillary lymph node)- Lesion quadrant: Upper outer quadrant Using sterile technique and 1% Lidocaine  as local anesthetic, under direct ultrasound visualization, a 12 gauge spring-loaded device was used to perform biopsy of an enlarged right axillary lymph node using a lateral approach. At the conclusion of the procedure  an open coil shaped tissue marker clip was deployed into the biopsy cavity. Follow up 2 view mammogram was performed and dictated separately. IMPRESSION: Ultrasound guided biopsy of a mass in the right breast and ultrasound-guided biopsy of a lymph node in the right axilla. No apparent complications. Electronically Signed: By: Norman Hopper M.D. On: 05/23/2023 15:32   US  RT BREAST BX W LOC DEV 1ST LESION IMG BX SPEC US  GUIDE Addendum Date: 05/25/2023 ADDENDUM REPORT: 05/25/2023 13:41 ADDENDUM: Pathology revealed GRADE III INVASIVE DUCTAL CARCINOMA, LYMPHOVASCULAR INVASION: NOT IDENTIFIED, OTHER FINDINGS: NONE, of the RIGHT  breast, 9 o'clock 9 cmfn, (coil clip). This was found to be concordant by Dr. Norman Hopper. Pathology revealed ONE BENIGN LYMPH NODE, NEGATIVE FOR CARCINOMA of the RIGHT axilla, (open coil clip). This was found to be concordant by Dr. Norman Hopper. Pathology results were discussed with the patient by telephone. The patient reported doing well after the biopsies with tenderness at the sites. Post biopsy instructions and care were reviewed and questions were answered. The patient was encouraged to call [facility] for any additional concerns. My direct phone number was provided. Medical oncology consultation has been arranged with Dr. [name] at [facility] on June 06, 2023. Surgical consultation has been arranged with Dr. Donnice Bury at [facility] on June 16, 2023. Pathology results reported by Hendricks Benders, RN on 05/25/2023. Electronically Signed   By: Norman Hopper M.D.   On: 05/25/2023 13:41   Result Date: 05/25/2023 CLINICAL DATA:  Here for ultrasound-guided biopsy of a mass in the right breast as well as a lymph node in the right axilla. EXAM: ULTRASOUND GUIDED RIGHT BREAST CORE NEEDLE BIOPSY ULTRASOUND-GUIDED RIGHT AXILLARY LYMPH NODE CORE NEEDLE BIOPSY COMPARISON:  Previous exam(s). PROCEDURE: I met with the patient and we discussed the procedure of ultrasound-guided biopsy, including benefits and alternatives. We discussed the high likelihood of a successful procedure. We discussed the risks of the procedure, including infection, bleeding, tissue injury, clip migration, and inadequate sampling. Informed written consent was given. The usual time-out protocol was performed immediately prior to the procedure. Site 1 (right breast mass)- Lesion quadrant: Upper outer quadrant Using sterile technique and 1% Lidocaine  as local anesthetic, under direct ultrasound visualization, a 12 gauge spring-loaded device was used  to perform biopsy of an irregular hypoechoic mass in the right breast at 9 o'clock 9 cm from the nipple using a lateral approach. At the conclusion of the procedure a coil shaped tissue marker clip was deployed into the biopsy cavity. Follow up 2 view mammogram was performed and dictated separately. Site 2 (right axillary lymph node)- Lesion quadrant: Upper outer quadrant Using sterile technique and 1% Lidocaine  as local anesthetic, under direct ultrasound visualization, a 12 gauge spring-loaded device was used to perform biopsy of an enlarged right axillary lymph node using a lateral approach. At the conclusion of the procedure an open coil shaped tissue marker clip was deployed into the biopsy cavity. Follow up 2 view mammogram was performed and dictated separately. IMPRESSION: Ultrasound guided biopsy of a mass in the right breast and ultrasound-guided biopsy of a lymph node in the right axilla. No apparent complications. Electronically Signed: By: Norman Hopper M.D. On: 05/23/2023 15:32   MM CLIP PLACEMENT RIGHT Result Date: 05/23/2023 CLINICAL DATA:  Status post ultrasound-guided biopsy of a mass in the right breast and a lymph node in the right axilla. EXAM: 3D DIAGNOSTIC RIGHT MAMMOGRAM POST ULTRASOUND BIOPSY  COMPARISON:  Previous exam(s). FINDINGS: 3D Mammographic images were obtained following ultrasound guided biopsy of a mass in the right breast and a lymph node in the right axilla. The biopsy marking clips are in expected position at the sites of biopsy. IMPRESSION: Appropriate positioning of the coil shaped biopsy marking clip at the site of biopsy in the right breast. Appropriate positioning of the open coil shaped biopsy marking clip at the site of biopsy in the right axilla. Final Assessment: Post Procedure Mammograms for Marker Placement Electronically Signed   By: Norman Hopper M.D.   On: 05/23/2023 15:34   MS 3D DIAG MAMMO BILAT BR (aka MM) Result Date: 05/22/2023 CLINICAL DATA:  Patient  with palpable mass in the right breast with associated tenderness/pain. EXAM: DIGITAL DIAGNOSTIC BILATERAL MAMMOGRAM WITH TOMOSYNTHESIS AND CAD; ULTRASOUND LEFT BREAST LIMITED; ULTRASOUND RIGHT BREAST LIMITED TECHNIQUE: Bilateral digital diagnostic mammography and breast tomosynthesis was performed. The images were evaluated with computer-aided detection. ; Targeted ultrasound examination of the left breast was performed.; Targeted ultrasound examination of the right breast was performed COMPARISON:  None. ACR Breast Density Category b: There are scattered areas of fibroglandular density. FINDINGS: In the right breast, there is a large mass centered in the lateral retroareolar breast, measuring 5-6 cm, associated with trabecular and overlying skin thickening. This corresponds to the palpable mass. Left, there is a small lobulated mass in the 6 o'clock position, 8-9 mm in long axis suspected to be a cluster of cysts. No other left breast masses, no architectural distortion and no suspicious calcifications. On physical exam, there is right breast skin thickening with an underlying firm mass in the retroareolar breast. Targeted right breast ultrasound is performed, showing a large hypoechoic mass with irregular and ill-defined margins at 9 o'clock, 9 cm the nipple, measuring 5.3 x 4.5 x 5.1 cm. The surrounding tissue demonstrates increased echogenicity consistent with inflammation. There is overlying skin thickening. Right axilla ultrasound shows 3-4 abnormal lymph nodes with cortical thickness is to 5 mm. Targeted left breast ultrasound is performed, showing the 2 adjacent clusters of cysts at 6 o'clock, 1 cm from the nipple, the largest measuring 8 x 4 x 6 mm, consistent with the lobulated mass seen mammographically. No solid masses or suspicious findings. IMPRESSION: 1. 5.3 cm highly suspicious mass in the right breast associated with adjacent inflammation and overlying skin thickening consistent with inflammatory  breast carcinoma. 2. 3-4 abnormal right axillary lymph nodes suspicious for metastatic disease. 3. 2 adjacent benign clusters of cysts in the left breast. No evidence of left breast malignancy. RECOMMENDATION: 1. Ultrasound-guided core needle biopsy of the right breast mass. 2. Ultrasound-guided core needle biopsy of 1 of the abnormal right axillary lymph nodes. I have discussed the findings and recommendations with the patient. If applicable, a reminder letter will be sent to the patient regarding the next appointment. BI-RADS CATEGORY  5: Highly suggestive of malignancy. Electronically Signed   By: Alm Parkins M.D.   On: 05/22/2023 10:25   US  LIMITED ULTRASOUND INCLUDING AXILLA RIGHT BREAST Result Date: 05/22/2023 CLINICAL DATA:  Patient with palpable mass in the right breast with associated tenderness/pain. EXAM: DIGITAL DIAGNOSTIC BILATERAL MAMMOGRAM WITH TOMOSYNTHESIS AND CAD; ULTRASOUND LEFT BREAST LIMITED; ULTRASOUND RIGHT BREAST LIMITED TECHNIQUE: Bilateral digital diagnostic mammography and breast tomosynthesis was performed. The images were evaluated with computer-aided detection. ; Targeted ultrasound examination of the left breast was performed.; Targeted ultrasound examination of the right breast was performed COMPARISON:  None. ACR Breast Density Category b: There are  scattered areas of fibroglandular density. FINDINGS: In the right breast, there is a large mass centered in the lateral retroareolar breast, measuring 5-6 cm, associated with trabecular and overlying skin thickening. This corresponds to the palpable mass. Left, there is a small lobulated mass in the 6 o'clock position, 8-9 mm in long axis suspected to be a cluster of cysts. No other left breast masses, no architectural distortion and no suspicious calcifications. On physical exam, there is right breast skin thickening with an underlying firm mass in the retroareolar breast. Targeted right breast ultrasound is performed, showing a  large hypoechoic mass with irregular and ill-defined margins at 9 o'clock, 9 cm the nipple, measuring 5.3 x 4.5 x 5.1 cm. The surrounding tissue demonstrates increased echogenicity consistent with inflammation. There is overlying skin thickening. Right axilla ultrasound shows 3-4 abnormal lymph nodes with cortical thickness is to 5 mm. Targeted left breast ultrasound is performed, showing the 2 adjacent clusters of cysts at 6 o'clock, 1 cm from the nipple, the largest measuring 8 x 4 x 6 mm, consistent with the lobulated mass seen mammographically. No solid masses or suspicious findings. IMPRESSION: 1. 5.3 cm highly suspicious mass in the right breast associated with adjacent inflammation and overlying skin thickening consistent with inflammatory breast carcinoma. 2. 3-4 abnormal right axillary lymph nodes suspicious for metastatic disease. 3. 2 adjacent benign clusters of cysts in the left breast. No evidence of left breast malignancy. RECOMMENDATION: 1. Ultrasound-guided core needle biopsy of the right breast mass. 2. Ultrasound-guided core needle biopsy of 1 of the abnormal right axillary lymph nodes. I have discussed the findings and recommendations with the patient. If applicable, a reminder letter will be sent to the patient regarding the next appointment. BI-RADS CATEGORY  5: Highly suggestive of malignancy. Electronically Signed   By: Alm Parkins M.D.   On: 05/22/2023 10:25   US  LIMITED ULTRASOUND INCLUDING AXILLA LEFT BREAST  Result Date: 05/22/2023 CLINICAL DATA:  Patient with palpable mass in the right breast with associated tenderness/pain. EXAM: DIGITAL DIAGNOSTIC BILATERAL MAMMOGRAM WITH TOMOSYNTHESIS AND CAD; ULTRASOUND LEFT BREAST LIMITED; ULTRASOUND RIGHT BREAST LIMITED TECHNIQUE: Bilateral digital diagnostic mammography and breast tomosynthesis was performed. The images were evaluated with computer-aided detection. ; Targeted ultrasound examination of the left breast was performed.; Targeted  ultrasound examination of the right breast was performed COMPARISON:  None. ACR Breast Density Category b: There are scattered areas of fibroglandular density. FINDINGS: In the right breast, there is a large mass centered in the lateral retroareolar breast, measuring 5-6 cm, associated with trabecular and overlying skin thickening. This corresponds to the palpable mass. Left, there is a small lobulated mass in the 6 o'clock position, 8-9 mm in long axis suspected to be a cluster of cysts. No other left breast masses, no architectural distortion and no suspicious calcifications. On physical exam, there is right breast skin thickening with an underlying firm mass in the retroareolar breast. Targeted right breast ultrasound is performed, showing a large hypoechoic mass with irregular and ill-defined margins at 9 o'clock, 9 cm the nipple, measuring 5.3 x 4.5 x 5.1 cm. The surrounding tissue demonstrates increased echogenicity consistent with inflammation. There is overlying skin thickening. Right axilla ultrasound shows 3-4 abnormal lymph nodes with cortical thickness is to 5 mm. Targeted left breast ultrasound is performed, showing the 2 adjacent clusters of cysts at 6 o'clock, 1 cm from the nipple, the largest measuring 8 x 4 x 6 mm, consistent with the lobulated mass seen mammographically. No solid masses or  suspicious findings. IMPRESSION: 1. 5.3 cm highly suspicious mass in the right breast associated with adjacent inflammation and overlying skin thickening consistent with inflammatory breast carcinoma. 2. 3-4 abnormal right axillary lymph nodes suspicious for metastatic disease. 3. 2 adjacent benign clusters of cysts in the left breast. No evidence of left breast malignancy. RECOMMENDATION: 1. Ultrasound-guided core needle biopsy of the right breast mass. 2. Ultrasound-guided core needle biopsy of 1 of the abnormal right axillary lymph nodes. I have discussed the findings and recommendations with the patient. If  applicable, a reminder letter will be sent to the patient regarding the next appointment. BI-RADS CATEGORY  5: Highly suggestive of malignancy. Electronically Signed   By: Alm Parkins M.D.   On: 05/22/2023 10:25    CODE STATUS:  Code Status History     Date Active Date Inactive Code Status Order ID Comments User Context   11/13/2015 0949 11/15/2015 1650 Full Code [id number]  Rosario Leatrice FERNS, MD Inpatient       Orders Placed This Encounter  Procedures   Consent Attestation for Oncology Treatment    The patient is informed of risks, benefits, side-effects of the prescribed oncology treatment. Potential short term and long term side effects and response rates discussed. After a long discussion, the patient made informed decision to proceed.:   Yes   CBC with Differential (Cancer Center Only)    Standing Status:   Future    Expected Date:   07/04/2023    Expiration Date:   07/03/2024   CMP (Cancer Center only)    Standing Status:   Future    Expected Date:   07/04/2023    Expiration Date:   07/03/2024   T4    Standing Status:   Future    Expected Date:   07/04/2023    Expiration Date:   07/03/2024   TSH    Standing Status:   Future    Expected Date:   07/04/2023    Expiration Date:   07/03/2024   CBC with Differential (Cancer Center Only)    Standing Status:   Future    Expected Date:   07/13/2023    Expiration Date:   07/12/2024   CMP (Cancer Center only)    Standing Status:   Future    Expected Date:   07/13/2023    Expiration Date:   07/12/2024   CBC with Differential (Cancer Center Only)    Standing Status:   Future    Expected Date:   07/20/2023    Expiration Date:   07/19/2024   CMP (Cancer Center only)    Standing Status:   Future    Expected Date:   07/20/2023    Expiration Date:   07/19/2024   PHYSICIAN COMMUNICATION ORDER    A baseline Echo/ Muga should be obtained prior to initiation of Anthracycline Chemotherapy   ONCBCN PHYSICIAN COMMUNICATION 1    In the KEYNOTE-522 trial  Maida et al., 2020), G-CSF was recommended after each chemotherapy cycle beginning 24 hours after the last dose of chemotherapy. For the paclitaxel  + carboplatin  cycles, filgrastim was recommended and it continued daily at least until 72 hours after the last dose of chemotherapy.   ONCBCN PHYSICIAN COMMUNICATION 2    Required labs: Thyroid  function tests at baseline and every 3rd cycle.   ONCBCN PHYSICIAN COMMUNICATION 4    0 Number of doses of carboplatin  received at [facility] or outside facility. AP signature of Provider. If patient has received greater than 5 doses  of carboplatin , the following pre-medications should be ordered: dexamethasone , diphenhydramine , and formulary histamine H2 antagonist. If patient cannot tolerate oral histamine H2 antagonist, IV may be given.     Future Appointments  Date Time Provider Department Center  07/03/2023 12:00 PM [facility] 1 [facility]  07/04/2023  8:00 AM [facility] PHLEBOTOMIST [facility] None  07/04/2023  8:15 AM [facility] INFUSION [facility] None  07/04/2023  8:30 AM Monte Bronder, MD [facility] None  07/04/2023  9:30 AM [facility] INFUSION [facility] None  07/11/2023 10:30 AM [facility] PHLEBOTOMIST [facility] None  07/11/2023 10:45 AM [facility] INFUSION [facility] None  07/11/2023 11:00 AM Cynthya Yam, MD [facility] None  07/13/2023 10:30 AM [facility] INFUSION [facility] None  07/18/2023  8:00 AM [facility] PHLEBOTOMIST [facility] None  07/18/2023  8:15 AM [facility] INFUSION [facility] None  07/18/2023  8:30 AM Sheri Prows, MD [facility] None  07/20/2023  9:45 AM [facility] INFUSION [facility] None  07/24/2023  2:15 PM [facility] INFUSION [facility] None  07/25/2023  2:15 PM [facility] INFUSION [facility] None  07/26/2023  2:15 PM [facility] INFUSION [facility] None      This document was completed utilizing speech recognition software. Grammatical errors, random word insertions, pronoun errors, and incomplete sentences are an occasional consequence of this system due to  software limitations, ambient noise, and hardware issues. Any formal questions or concerns about the content, text or information contained within the body of this dictation should be directly addressed to the provider for clarification.

## 2023-06-20 ENCOUNTER — Other Ambulatory Visit: Payer: Self-pay

## 2023-06-20 ENCOUNTER — Other Ambulatory Visit: Payer: Self-pay | Admitting: *Deleted

## 2023-06-20 DIAGNOSIS — C50911 Malignant neoplasm of unspecified site of right female breast: Secondary | ICD-10-CM

## 2023-06-21 ENCOUNTER — Telehealth: Payer: Self-pay | Admitting: Genetic Counselor

## 2023-06-21 NOTE — Telephone Encounter (Signed)
 Scheduled appointments per scheduling message. Patient is aware of the made appointments and will be mailed an appointment reminder.

## 2023-06-22 ENCOUNTER — Encounter: Payer: Self-pay | Admitting: Oncology

## 2023-06-22 ENCOUNTER — Other Ambulatory Visit: Payer: Self-pay

## 2023-06-22 NOTE — Progress Notes (Signed)
Pharmacist Chemotherapy Monitoring - Initial Assessment    Anticipated start date: 07/04/23   The following has been reviewed per standard work regarding the patient's treatment regimen: The patient's diagnosis, treatment plan and drug doses, and organ/hematologic function Lab orders and baseline tests specific to treatment regimen  The treatment plan start date, drug sequencing, and pre-medications Prior authorization status  Patient's documented medication list, including drug-drug interaction screen and prescriptions for anti-emetics and supportive care specific to the treatment regimen The drug concentrations, fluid compatibility, administration routes, and timing of the medications to be used The patient's access for treatment and lifetime cumulative dose history, if applicable  The patient's medication allergies and previous infusion related reactions, if applicable   Changes made to treatment plan:  N/A  Follow up needed:  No listed insurance (medicaid pending).  [id number] notified   Merlene Laughter [place], [place], 06/22/2023  2:26 PM

## 2023-06-23 ENCOUNTER — Encounter: Payer: Self-pay | Admitting: Oncology

## 2023-06-28 ENCOUNTER — Encounter: Payer: Self-pay | Admitting: Oncology

## 2023-06-29 ENCOUNTER — Inpatient Hospital Stay: Payer: MEDICAID

## 2023-06-29 NOTE — Progress Notes (Signed)
See education note

## 2023-07-02 ENCOUNTER — Other Ambulatory Visit: Payer: Self-pay | Admitting: Radiology

## 2023-07-03 ENCOUNTER — Encounter (HOSPITAL_COMMUNITY): Payer: Self-pay

## 2023-07-03 ENCOUNTER — Other Ambulatory Visit: Payer: Self-pay

## 2023-07-03 ENCOUNTER — Ambulatory Visit (HOSPITAL_COMMUNITY)
Admission: RE | Admit: 2023-07-03 | Discharge: 2023-07-03 | Disposition: A | Discharge status: Home / Self Care | Payer: Medicaid Other | Source: Ambulatory Visit | Attending: Oncology | Admitting: Oncology

## 2023-07-03 DIAGNOSIS — Z5941 Food insecurity: Secondary | ICD-10-CM | POA: Insufficient documentation

## 2023-07-03 DIAGNOSIS — C50911 Malignant neoplasm of unspecified site of right female breast: Secondary | ICD-10-CM | POA: Diagnosis present

## 2023-07-03 HISTORY — PX: IR IMAGING GUIDED PORT INSERTION: IMG5740

## 2023-07-03 MED ORDER — LIDOCAINE HCL 1 % IJ SOLN
INTRAMUSCULAR | Status: AC
  Filled 2023-07-03: qty 20

## 2023-07-03 MED ORDER — HEPARIN SOD (PORK) LOCK FLUSH 100 UNIT/ML IV SOLN
500.0000 [IU] | Freq: Once | INTRAVENOUS | Status: AC
  Administered 2023-07-03: 500 [IU] via INTRAVENOUS

## 2023-07-03 MED ORDER — HEPARIN SOD (PORK) LOCK FLUSH 100 UNIT/ML IV SOLN
INTRAVENOUS | Status: AC
  Filled 2023-07-03: qty 5

## 2023-07-03 MED ORDER — MIDAZOLAM HCL 2 MG/2ML IJ SOLN
INTRAMUSCULAR | Status: AC | PRN
  Administered 2023-07-03: 1 mg via INTRAVENOUS
  Administered 2023-07-03 (×2): .5 mg via INTRAVENOUS

## 2023-07-03 MED ORDER — FENTANYL CITRATE (PF) 100 MCG/2ML IJ SOLN
INTRAMUSCULAR | Status: AC | PRN
Start: 2023-07-03 — End: 2023-07-03
  Administered 2023-07-03: 50 ug via INTRAVENOUS
  Administered 2023-07-03 (×2): 25 ug via INTRAVENOUS

## 2023-07-03 MED ORDER — LIDOCAINE HCL 1 % IJ SOLN
20.0000 mL | Freq: Once | INTRAMUSCULAR | Status: AC
Start: 2023-07-03 — End: 2023-07-03
  Administered 2023-07-03: 20 mL via INTRADERMAL

## 2023-07-03 MED ORDER — MIDAZOLAM HCL 2 MG/2ML IJ SOLN
INTRAMUSCULAR | Status: AC
  Filled 2023-07-03: qty 2

## 2023-07-03 MED ORDER — FENTANYL CITRATE (PF) 100 MCG/2ML IJ SOLN
INTRAMUSCULAR | Status: AC
  Filled 2023-07-03: qty 2

## 2023-07-03 NOTE — Procedures (Signed)
Interventional Radiology Procedure Note  Procedure: Single Lumen Power Port Placement    Access:  Left IJ vein.  Findings: Catheter tip positioned at SVC/RA junction. Port is ready for immediate use.   Complications: None  EBL: < 10 mL  Recommendations:  - Ok to shower in 24 hours - Do not submerge for 7 days - Routine line care   Jasmine Novak T. Fredia Sorrow, M.D Pager:  [phone]

## 2023-07-03 NOTE — H&P (Signed)
Chief Complaint: Patient was seen in consultation today for Breast cancer-- port a-cath placement at the request of Pasam,Avinash  Referring Physician(s): Pasam,Avinash  Supervising Physician: Irish Lack  Patient Status: [facility] - Out-pt  History of Present Illness: Jasmine Novak is a 53 y.o. female   FULL Code status per pt  New dx Right breast cancer Follows with Dr Arlana Pouch  To start chemotherapy tomorrow Planned for [facility] a cath placement in IR today   Past Medical History:  Diagnosis Date   Herpes simplex    states hasn't had outbreak since she was "young"   Hypertension     Past Surgical History:  Procedure Laterality Date   BREAST BIOPSY Right 05/23/2023   [place] RT BREAST BX W LOC DEV 1ST LESION IMG BX SPEC US GUIDE 05/23/2023 [facility] MAMMOGRAPHY   CARPAL TUNNEL RELEASE  1996 & 1999   bilateral wrist    Allergies: Lisinopril  Medications: Prior to Admission medications   Medication Sig Start Date End Date Taking? Authorizing Provider  amLODipine (NORVASC) 10 MG tablet Take 1 tablet (10 mg total) by mouth daily. 08/22/18  Yes Mike Gip, FNP  carvedilol (COREG) 12.5 MG tablet Take 12.5 mg by mouth. 06/30/22  Yes [provider]  furosemide (LASIX) 40 MG tablet TAKE 1 TABLET(40 MG) BY MOUTH DAILY 10/01/18  Yes Mike Gip, FNP  ibuprofen (ADVIL) 200 MG tablet Take 200 mg by mouth every 6 (six) hours as needed.   Yes [provider]  irbesartan (AVAPRO) 300 MG tablet Take by mouth. 06/16/22  Yes [provider]  spironolactone (ALDACTONE) 25 MG tablet Take by mouth. 06/16/22  Yes [provider]  traMADol (ULTRAM) 50 MG tablet Take 1 tablet (50 mg total) by mouth every 12 (twelve) hours as needed. 06/06/23  Yes Pasam, Avinash, MD  dexamethasone (DECADRON) 4 MG tablet Take 2 tablets once a day for 3 days after chemotherapy. Take with food. 06/19/23   Pasam, Archie Patten, MD  lidocaine-prilocaine (EMLA) cream Apply to affected area  once 06/19/23   Pasam, Avinash, MD  ondansetron (ZOFRAN) 8 MG tablet Take 1 tablet (8 mg total) by mouth every 8 (eight) hours as needed for nausea or vomiting. Start on the third day after chemotherapy. 06/19/23   Pasam, Archie Patten, MD  prochlorperazine (COMPAZINE) 10 MG tablet Take 1 tablet (10 mg total) by mouth every 6 (six) hours as needed for nausea or vomiting. 06/19/23   Pasam, Archie Patten, MD     Family History  Problem Relation Age of Onset   Stroke Father    Heart disease Daughter    Diabetes Maternal Grandmother     Social History   Socioeconomic History   Marital status: Single    Spouse name: Not on file   Number of children: Not on file   Years of education: Not on file   Highest education level: Not on file  Occupational History   Not on file  Tobacco Use   Smoking status: Never   Smokeless tobacco: Never  Vaping Use   Vaping status: Never Used  Substance and Sexual Activity   Alcohol use: No    Comment: wine once a month   Drug use: No   Sexual activity: Yes    Birth control/protection: Condom  Other Topics Concern   Not on file  Social History Narrative   Not on file   Social Drivers of Health   Financial Resource Strain: Not on file  Food Insecurity: Food Insecurity Present (06/06/2023)  Hunger Vital Sign    Worried About Running Out of Food in the Last Year: Often true    Ran Out of Food in the Last Year: Often true  Transportation Needs: No Transportation Needs (06/06/2023)   PRAPARE - Administrator, Civil Service (Medical): No    Lack of Transportation (Non-Medical): No  Physical Activity: Not on file  Stress: Not on file  Social Connections: Not on file     Review of Systems: A 12 point ROS discussed and pertinent positives are indicated in the HPI above.  All other systems are negative.  Review of Systems  Constitutional:  Positive for activity change. Negative for fatigue and fever.  Respiratory:  Negative for cough and shortness  of breath.   Gastrointestinal:  Negative for abdominal pain.  Psychiatric/Behavioral:  Negative for behavioral problems and confusion.     Vital Signs: BP (!) 182/88   Pulse 85   Temp 99.4 F (37.4 C) (Oral)   Ht 5\' 6"  (1.676 m)   Wt (!) 322 lb (146.1 kg)   LMP 01/17/2022 (Approximate)   SpO2 98%   BMI 51.97 kg/m     Physical Exam Vitals reviewed.  HENT:     Mouth/Throat:     Mouth: Mucous membranes are moist.  Cardiovascular:     Rate and Rhythm: Normal rate and regular rhythm.     Heart sounds: Normal heart sounds. No murmur heard. Pulmonary:     Effort: Pulmonary effort is normal.     Breath sounds: Normal breath sounds. No wheezing.  Abdominal:     Palpations: Abdomen is soft.     Tenderness: There is no abdominal tenderness.  Musculoskeletal:        General: Normal range of motion.  Skin:    General: Skin is warm.     Comments: Rt breast mass Tender to pt  Neurological:     Mental Status: She is alert and oriented to person, place, and time.  Psychiatric:        Behavior: Behavior normal.     Imaging: NM PET Image Initial (PI) Skull Base To Thigh Result Date: 06/14/2023 CLINICAL DATA:  Initial treatment strategy for RIGHT breast carcinoma. EXAM: NUCLEAR MEDICINE PET SKULL BASE TO THIGH TECHNIQUE: 15.9 mCi F-18 FDG was injected intravenously. Full-ring PET imaging was performed from the skull base to thigh after the radiotracer. CT data was obtained and used for attenuation correction and anatomic localization. Fasting blood glucose: 78 mg/dl COMPARISON:  None Available. FINDINGS: NECK: No hypermetabolic lymph nodes in the neck. CHEST: Intense metabolic activity within a 6.8 cm RIGHT breast mass with SUV max equal 35.8. Several hypermetabolic RIGHT axial lymph nodes. For example 17 mm node on image 61 with SUV max equal 5.3. There are 2 axillary hypermetabolic nodes in 1 sub pectoralis node. Hypermetabolic sub pectoralis node on image 67. No central thoracic  hypermetabolic lymph nodes. No suspicious pulmonary nodules Incidental CT findings: None. ABDOMEN/PELVIS: No abnormal hypermetabolic activity within the liver, pancreas, adrenal glands, or spleen. No hypermetabolic lymph nodes in the abdomen or pelvis. Incidental CT findings: Nonobstructing RIGHT renal calculus measures 10 mm. Uterus and adnexa unremarkable. SKELETON: No focal hypermetabolic activity to suggest skeletal metastasis. Incidental CT findings: None. IMPRESSION: 1. Intense metabolic activity within the RIGHT breast mass consistent with primary breast carcinoma. 2. Hypermetabolic RIGHT axillary and sub pectoralis lymph nodes consistent with local nodal metastasis. 3. No evidence of distant metastatic disease. 4. Nonobstructing RIGHT renal calculus. Electronically Signed  By: Genevive Bi M.D.   On: 06/14/2023 10:12   ECHOCARDIOGRAM COMPLETE Result Date: 06/13/2023    ECHOCARDIOGRAM REPORT   Patient Name:   Jasmine Novak Date of Exam: 06/13/2023 Medical Rec #:  [id number]     Height:       66.0 in Accession #:    [id number]    Weight:       330.7 lb Date of Birth:  1971/03/26     BSA:          2.476 m Patient Age:    52 years      BP:           174/81 mmHg Patient Gender: F             HR:           81 bpm. Exam Location:  Outpatient Procedure: 2D Echo, Cardiac Doppler and Color Doppler Indications:    Chemo Z09  History:        Patient has no prior history of Echocardiogram examinations.  Sonographer:    Harriette Bouillon RDCS Referring Phys: [id number] AVINASH PASAM IMPRESSIONS  1. Left ventricular ejection fraction, by estimation, is 55 to 60%. The left ventricle has normal function. Left ventricular endocardial border not optimally defined to evaluate regional wall motion. There is moderate concentric left ventricular hypertrophy. Left ventricular diastolic parameters are indeterminate.  2. Right ventricular systolic function is normal. The right ventricular size is not well visualized. Tricuspid  regurgitation signal is inadequate for assessing PA pressure.  3. The mitral valve was not well visualized. No evidence of mitral valve regurgitation. No evidence of mitral stenosis.  4. The aortic valve was not well visualized. Aortic valve regurgitation is not visualized. No aortic stenosis is present.  5. The inferior vena cava is normal in size with greater than 50% respiratory variability, suggesting right atrial pressure of 3 mmHg. Comparison(s): No significant change from prior study. Conclusion(s)/Recommendation(s): Technically challenging study, limited acoustic windows. Valves not well seen but are normal by Doppler assessment. Grossly normal LV function, though regional wall motion abnormalities cannot be excluded based on this study. FINDINGS  Left Ventricle: Left ventricular ejection fraction, by estimation, is 55 to 60%. The left ventricle has normal function. Left ventricular endocardial border not optimally defined to evaluate regional wall motion. The left ventricular internal cavity size was normal in size. There is moderate concentric left ventricular hypertrophy. Left ventricular diastolic parameters are indeterminate. Right Ventricle: The right ventricular size is not well visualized. Right vetricular wall thickness was not well visualized. Right ventricular systolic function is normal. Tricuspid regurgitation signal is inadequate for assessing PA pressure. Left Atrium: Left atrial size was not well visualized. Right Atrium: Right atrial size was not well visualized. Pericardium: Trivial pericardial effusion is present. Mitral Valve: The mitral valve was not well visualized. No evidence of mitral valve regurgitation. No evidence of mitral valve stenosis. Tricuspid Valve: The tricuspid valve is not well visualized. Tricuspid valve regurgitation is not demonstrated. No evidence of tricuspid stenosis. Aortic Valve: The aortic valve was not well visualized. Aortic valve regurgitation is not  visualized. No aortic stenosis is present. Pulmonic Valve: The pulmonic valve was not well visualized. Pulmonic valve regurgitation is not visualized. Aorta: The aortic root and ascending aorta are structurally normal, with no evidence of dilitation. Venous: The inferior vena cava is normal in size with greater than 50% respiratory variability, suggesting right atrial pressure of 3 mmHg. IAS/Shunts: The interatrial septum was not well visualized.  LEFT VENTRICLE PLAX 2D LVIDd:         4.90 cm   Diastology LVIDs:         3.20 cm   LV e' medial:    6.53 cm/s LV PW:         1.60 cm   LV E/e' medial:  12.8 LV IVS:        1.70 cm   LV e' lateral:   8.38 cm/s LVOT diam:     2.40 cm   LV E/e' lateral: 10.0 LV SV:         67 LV SV Index:   27 LVOT Area:     4.52 cm  RIGHT VENTRICLE             IVC RV S prime:     16.40 cm/s  IVC diam: 1.70 cm TAPSE (M-mode): 2.3 cm LEFT ATRIUM           Index LA diam:      4.30 cm 1.74 cm/m LA Vol (A4C): 49.0 ml 19.79 ml/m  AORTIC VALVE LVOT Vmax:   76.30 cm/s LVOT Vmean:  49.700 cm/s LVOT VTI:    0.148 m  AORTA Ao Root diam: 3.00 cm Ao Asc diam:  3.30 cm MITRAL VALVE MV Area (PHT): 3.54 cm    SHUNTS MV Decel Time: 214 msec    Systemic VTI:  0.15 m MV E velocity: 83.40 cm/s  Systemic Diam: 2.40 cm MV A velocity: 80.00 cm/s MV E/A ratio:  1.04 Jodelle Red MD Electronically signed by Jodelle Red MD Signature Date/Time: 06/13/2023/4:04:04 PM    Final     Labs:  CBC: Recent Labs    05/07/23 1701 06/06/23 1038  WBC 7.3 8.0  HGB 12.1 12.3  HCT 38.6 38.2  PLT 275 266    COAGS: No results for input(s): "INR", "APTT" in the last 8760 hours.  BMP: Recent Labs    05/07/23 1701 06/06/23 1038  NA 136 137  K 4.0 3.9  CL 103 105  CO2 27 25  GLUCOSE 91 104*  BUN 13 13  CALCIUM 8.9 8.9  CREATININE 0.84 0.80  GFRNONAA >60 >60    LIVER FUNCTION TESTS: Recent Labs    05/07/23 1701 06/06/23 1038  BILITOT 0.3 0.5  AST 16 18  ALT 17 18  ALKPHOS 88  103  PROT 7.7 8.1  ALBUMIN 3.8 4.1    TUMOR MARKERS: Recent Labs    06/06/23 1038  CEA 1.12    Assessment and Plan:  Scheduled for Port a cath placement in IR Risks and benefits of image guided port-a-catheter placement was discussed with the patient including, but not limited to bleeding, infection, pneumothorax, or fibrin sheath development and need for additional procedures.  All of the patient's questions were answered, patient is agreeable to proceed. Consent signed and in chart.  Thank you for this interesting consult.  I greatly enjoyed meeting Aanchal L Curto and look forward to participating in their care.  A copy of this report was sent to the requesting provider on this date.  Electronically Signed: Robet Leu, PA-C 07/03/2023, 10:52 AM   I spent a total of  30 Minutes   in face to face in clinical consultation, greater than 50% of which was counseling/coordinating care for port a cath placement

## 2023-07-04 ENCOUNTER — Inpatient Hospital Stay: Payer: MEDICAID

## 2023-07-04 ENCOUNTER — Inpatient Hospital Stay: Payer: Self-pay

## 2023-07-04 ENCOUNTER — Inpatient Hospital Stay (HOSPITAL_BASED_OUTPATIENT_CLINIC_OR_DEPARTMENT_OTHER): Payer: MEDICAID | Admitting: Oncology

## 2023-07-04 ENCOUNTER — Other Ambulatory Visit: Payer: Self-pay

## 2023-07-04 VITALS — BP 158/80 | HR 78 | Temp 97.5°F | Resp 18 | Ht 66.0 in | Wt 321.9 lb

## 2023-07-04 VITALS — BP 162/76 | HR 103 | Temp 98.1°F | Resp 18

## 2023-07-04 DIAGNOSIS — T451X5A Adverse effect of antineoplastic and immunosuppressive drugs, initial encounter: Secondary | ICD-10-CM | POA: Insufficient documentation

## 2023-07-04 DIAGNOSIS — G893 Neoplasm related pain (acute) (chronic): Secondary | ICD-10-CM

## 2023-07-04 DIAGNOSIS — C50911 Malignant neoplasm of unspecified site of right female breast: Secondary | ICD-10-CM

## 2023-07-04 DIAGNOSIS — I1A0 Resistant hypertension: Secondary | ICD-10-CM

## 2023-07-04 DIAGNOSIS — Z5111 Encounter for antineoplastic chemotherapy: Secondary | ICD-10-CM | POA: Diagnosis not present

## 2023-07-04 LAB — CBC WITH DIFFERENTIAL (CANCER CENTER ONLY)
Abs Immature Granulocytes: 0.02 10*3/uL (ref 0.00–0.07)
Basophils Absolute: 0 10*3/uL (ref 0.0–0.1)
Basophils Relative: 0 %
Eosinophils Absolute: 0.1 10*3/uL (ref 0.0–0.5)
Eosinophils Relative: 2 %
HCT: 36.3 % (ref 36.0–46.0)
Hemoglobin: 11.6 g/dL — ABNORMAL LOW (ref 12.0–15.0)
Immature Granulocytes: 0 %
Lymphocytes Relative: 21 %
Lymphs Abs: 1.5 10*3/uL (ref 0.7–4.0)
MCH: 27 pg (ref 26.0–34.0)
MCHC: 32 g/dL (ref 30.0–36.0)
MCV: 84.4 fL (ref 80.0–100.0)
Monocytes Absolute: 0.7 10*3/uL (ref 0.1–1.0)
Monocytes Relative: 10 %
Neutro Abs: 4.7 10*3/uL (ref 1.7–7.7)
Neutrophils Relative %: 67 %
Platelet Count: 249 10*3/uL (ref 150–400)
RBC: 4.3 MIL/uL (ref 3.87–5.11)
RDW: 15.3 % (ref 11.5–15.5)
WBC Count: 7 10*3/uL (ref 4.0–10.5)
nRBC: 0 % (ref 0.0–0.2)

## 2023-07-04 LAB — CMP (CANCER CENTER ONLY)
ALT: 16 U/L (ref 0–44)
AST: 14 U/L — ABNORMAL LOW (ref 15–41)
Albumin: 3.6 g/dL (ref 3.5–5.0)
Alkaline Phosphatase: 94 U/L (ref 38–126)
Anion gap: 6 (ref 5–15)
BUN: 15 mg/dL (ref 6–20)
CO2: 28 mmol/L (ref 22–32)
Calcium: 8.9 mg/dL (ref 8.9–10.3)
Chloride: 103 mmol/L (ref 98–111)
Creatinine: 0.77 mg/dL (ref 0.44–1.00)
GFR, Estimated: >60 mL/min (ref >60)
Glucose, Bld: 148 mg/dL — ABNORMAL HIGH (ref 70–99)
Potassium: 3.6 mmol/L (ref 3.5–5.1)
Sodium: 137 mmol/L (ref 135–145)
Total Bilirubin: 0.4 mg/dL (ref 0.0–1.2)
Total Protein: 7.9 g/dL (ref 6.5–8.1)

## 2023-07-04 LAB — TSH: TSH: 1.43 u[IU]/mL (ref 0.350–4.500)

## 2023-07-04 MED ORDER — FAMOTIDINE IN NACL 20-0.9 MG/50ML-% IV SOLN: 20.0000 mg | Freq: Once | INTRAVENOUS | Status: DC | PRN

## 2023-07-04 MED ORDER — ALBUTEROL SULFATE HFA 108 (90 BASE) MCG/ACT IN AERS: 2.0000 | INHALATION_SPRAY | Freq: Once | RESPIRATORY_TRACT | Status: DC | PRN

## 2023-07-04 MED ORDER — SODIUM CHLORIDE 0.9 % IV SOLN
Freq: Once | INTRAVENOUS | Status: DC | PRN
Start: 2023-07-04 — End: 2023-07-04

## 2023-07-04 MED ORDER — PROCHLORPERAZINE MALEATE 10 MG PO TABS: 10.0000 mg | ORAL_TABLET | Freq: Four times a day (QID) | ORAL | 1 refills | Status: DC | PRN

## 2023-07-04 MED ORDER — DEXAMETHASONE SODIUM PHOSPHATE 10 MG/ML IJ SOLN
10.0000 mg | Freq: Once | INTRAMUSCULAR | Status: AC
  Administered 2023-07-04: 10 mg via INTRAVENOUS
  Filled 2023-07-04: qty 1

## 2023-07-04 MED ORDER — ALBUTEROL SULFATE (2.5 MG/3ML) 0.083% IN NEBU
2.5000 mg | INHALATION_SOLUTION | Freq: Once | RESPIRATORY_TRACT | Status: AC
  Administered 2023-07-04: 2.5 mg via RESPIRATORY_TRACT

## 2023-07-04 MED ORDER — SODIUM CHLORIDE 0.9% FLUSH
10.0000 mL | INTRAVENOUS | Status: DC | PRN
Start: 2023-07-04 — End: 2023-07-04
  Administered 2023-07-04: 10 mL

## 2023-07-04 MED ORDER — SODIUM CHLORIDE 0.9 % IV SOLN
80.0000 mg/m2 | Freq: Once | INTRAVENOUS | Status: AC
  Administered 2023-07-04: 210 mg via INTRAVENOUS
  Filled 2023-07-04: qty 35

## 2023-07-04 MED ORDER — PROCHLORPERAZINE EDISYLATE 10 MG/2ML IJ SOLN
10.0000 mg | Freq: Once | INTRAMUSCULAR | Status: AC
  Administered 2023-07-04: 10 mg via INTRAVENOUS
  Filled 2023-07-04: qty 2

## 2023-07-04 MED ORDER — DIPHENHYDRAMINE HCL 50 MG/ML IJ SOLN
25.0000 mg | Freq: Once | INTRAMUSCULAR | Status: AC | PRN
  Administered 2023-07-04: 25 mg via INTRAVENOUS

## 2023-07-04 MED ORDER — LIDOCAINE-PRILOCAINE 2.5-2.5 % EX CREA: TOPICAL_CREAM | CUTANEOUS | 3 refills | Status: DC

## 2023-07-04 MED ORDER — ONDANSETRON HCL 8 MG PO TABS: 8.0000 mg | ORAL_TABLET | Freq: Three times a day (TID) | ORAL | 1 refills | Status: DC | PRN

## 2023-07-04 MED ORDER — EPINEPHRINE 0.3 MG/0.3ML IJ SOAJ: 0.3000 mg | Freq: Once | INTRAMUSCULAR | Status: DC | PRN

## 2023-07-04 MED ORDER — METHYLPREDNISOLONE SODIUM SUCC 125 MG IJ SOLR
125.0000 mg | Freq: Once | INTRAMUSCULAR | Status: AC | PRN
  Administered 2023-07-04: 125 mg via INTRAVENOUS

## 2023-07-04 MED ORDER — SODIUM CHLORIDE 0.9 % IV SOLN: Freq: Once | INTRAVENOUS | Status: DC

## 2023-07-04 MED ORDER — PALONOSETRON HCL INJECTION 0.25 MG/5ML
0.2500 mg | Freq: Once | INTRAVENOUS | Status: AC
  Administered 2023-07-04: 0.25 mg via INTRAVENOUS
  Filled 2023-07-04: qty 5

## 2023-07-04 MED ORDER — FAMOTIDINE IN NACL 20-0.9 MG/50ML-% IV SOLN
20.0000 mg | Freq: Once | INTRAVENOUS | Status: AC
  Administered 2023-07-04: 20 mg via INTRAVENOUS
  Filled 2023-07-04: qty 50

## 2023-07-04 MED ORDER — CARBOPLATIN CHEMO INJECTION 600 MG/60ML
750.0000 mg | Freq: Once | INTRAVENOUS | Status: DC
  Filled 2023-07-04: qty 75

## 2023-07-04 MED ORDER — SODIUM CHLORIDE 0.9 % IV SOLN
200.0000 mg | Freq: Once | INTRAVENOUS | Status: AC
  Administered 2023-07-04: 200 mg via INTRAVENOUS
  Filled 2023-07-04: qty 8

## 2023-07-04 MED ORDER — SODIUM CHLORIDE 0.9 % IV SOLN: INTRAVENOUS | Status: DC

## 2023-07-04 MED ORDER — DIPHENHYDRAMINE HCL 50 MG/ML IJ SOLN
25.0000 mg | Freq: Once | INTRAMUSCULAR | Status: AC
  Administered 2023-07-04: 25 mg via INTRAVENOUS
  Filled 2023-07-04: qty 1

## 2023-07-04 MED ORDER — FOSAPREPITANT DIMEGLUMINE INJECTION 150 MG
150.0000 mg | Freq: Once | INTRAVENOUS | Status: AC
  Administered 2023-07-04: 150 mg via INTRAVENOUS
  Filled 2023-07-04: qty 150

## 2023-07-04 MED ORDER — DEXAMETHASONE 4 MG PO TABS: ORAL_TABLET | ORAL | 1 refills | Status: DC

## 2023-07-04 MED ORDER — HEPARIN SOD (PORK) LOCK FLUSH 100 UNIT/ML IV SOLN
500.0000 [IU] | Freq: Once | INTRAVENOUS | Status: AC | PRN
  Administered 2023-07-04: 500 [IU]

## 2023-07-04 NOTE — Assessment & Plan Note (Signed)
Patient reports discomfort and tenderness in the area of the right breast mass. -Previously provided prescription for Tramadol for pain management as needed, with instructions for potential side effects (drowsiness, constipation) and the use of a stool softener if constipation occurs.

## 2023-07-04 NOTE — Patient Instructions (Signed)
Implanted [facility] Guide An implanted port is a device that is placed under the skin. It is usually placed in the chest. The device may vary based on the need. Implanted ports can be used to give IV medicine, to take blood, or to give fluids. You may have an implanted port if: You need IV medicine that would be irritating to the small veins in your hands or arms. You need IV medicines, such as chemotherapy, for a long period of time. You need IV nutrition for a long period of time. You may have fewer limitations when using a port than you would if you used other types of long-term IVs. You will also likely be able to return to normal activities after your incision heals. An implanted port has two main parts: Reservoir. The reservoir is the part where a needle is inserted to give medicines or draw blood. The reservoir is round. After the port is placed, it appears as a small, raised area under your skin. Catheter. The catheter is a small, thin tube that connects the reservoir to a vein. Medicine that is inserted into the reservoir goes into the catheter and then into the vein. How is my port accessed? To access your port: A numbing cream may be placed on the skin over the port site. Your health care provider will put on a mask and sterile gloves. The skin over your port will be cleaned carefully with a germ-killing soap and allowed to dry. Your health care provider will gently pinch the port and insert a needle into it. Your health care provider will check for a blood return to make sure the port is in the vein and is still working (patent). If your port needs to remain accessed to get medicine continuously (constant infusion), your health care provider will place a clear bandage (dressing) over the needle site. The dressing and needle will need to be changed every week, or as told by your health care provider. What is flushing? Flushing helps keep the port working. Follow instructions from your  health care provider about how and when to flush the port. Ports are usually flushed with saline solution or a medicine called heparin. The need for flushing will depend on how the port is used: If the port is only used from time to time to give medicines or draw blood, the port may need to be flushed: Before and after medicines have been given. Before and after blood has been drawn. As part of routine maintenance. Flushing may be recommended every 4-6 weeks. If a constant infusion is running, the port may not need to be flushed. Throw away any syringes in a disposal container that is meant for sharp items (sharps container). You can buy a sharps container from a pharmacy, or you can make one by using an empty hard plastic bottle with a cover. How long will my port stay implanted? The port can stay in for as long as your health care provider thinks it is needed. When it is time for the port to come out, a surgery will be done to remove it. The surgery will be similar to the procedure that was done to put the port in. Follow these instructions at home: Caring for your port and port site Flush your port as told by your health care provider. If you need an infusion over several days, follow instructions from your health care provider about how to take care of your port site. Make sure you: Change your  dressing as told by your health care provider. Wash your hands with soap and water for at least 20 seconds before and after you change your dressing. If soap and water are not available, use alcohol-based hand sanitizer. Place any used dressings or infusion bags into a plastic bag. Throw that bag in the trash. Keep the dressing that covers the needle clean and dry. Do not get it wet. Do not use scissors or sharp objects near the infusion tubing. Keep any external tubes clamped, unless they are being used. Check your port site every day for signs of infection. Check for: Redness, swelling, or  pain. Fluid or blood. Warmth. Pus or a bad smell. Protect the skin around the port site. Avoid wearing bra straps that rub or irritate the site. Protect the skin around your port from seat belts. Place a soft pad over your chest if needed. Bathe or shower as told by your health care provider. The site may get wet as long as you are not actively receiving an infusion. General instructions  Return to your normal activities as told by your health care provider. Ask your health care provider what activities are safe for you. Carry a medical alert card or wear a medical alert bracelet at all times. This will let health care providers know that you have an implanted port in case of an emergency. Where to find more information American Cancer Society: www.cancer.org American Society of Clinical Oncology: www.cancer.net Contact a health care provider if: You have a fever or chills. You have redness, swelling, or pain at the port site. You have fluid or blood coming from your port site. Your incision feels warm to the touch. You have pus or a bad smell coming from the port site. Summary Implanted ports are usually placed in the chest for long-term IV access. Follow instructions from your health care provider about flushing the port and changing bandages (dressings). Take care of the area around your port by avoiding clothing that puts pressure on the area, and by watching for signs of infection. Protect the skin around your port from seat belts. Place a soft pad over your chest if needed. Contact a health care provider if you have a fever or you have redness, swelling, pain, fluid, or a bad smell at the port site. This information is not intended to replace advice given to you by your health care provider. Make sure you discuss any questions you have with your health care provider. Document Revised: 11/24/2020 Document Reviewed: 11/24/2020 Elsevier Patient Education  2024 [employer].

## 2023-07-04 NOTE — Progress Notes (Signed)
Hypersensitivity Reaction note  Date of event: 07/04/23 Time of event: 1035 Generic name of drug involved: fosaprepitant Name of provider notified of the hypersensitivity reaction: Dr. Archie Patten Pasam Was agent that likely caused hypersensitivity reaction added to Allergies List within EMR? yes Chain of events including reaction signs/symptoms, treatment administered, and outcome (e.g., drug resumed; drug discontinued; sent to Emergency Department; etc.)  Fosaprepitant started at 1028. Patient started to complain of lower back pain 5/10, sharp in center of lower back that was new onset. Fosaprepitant was stopped. NS started to flush line.  Heat was applied to lower back.  VS checked and were stable.  Dr. Archie Patten Pasam made aware. Verbal orders received to d/c fosaprepitant and to give 10mg  compazine IV (see MAR).  Patient notified of Dr. Zenda Alpers recommendations and verbalized agreement.  Back pain improved to 2/10 prior to administration of compazine.   Nonah Mattes, RN 07/04/2023 11:16 AM

## 2023-07-04 NOTE — Patient Instructions (Signed)
[facility]  Discharge Instructions: Thank you for choosing [facility] to provide your oncology and hematology care.   If you have a lab appointment with the Cancer Center, please go directly to the Cancer Center and check in at the registration area.   Wear comfortable clothing and clothing appropriate for easy access to any Portacath or PICC line.   We strive to give you quality time with your provider. You may need to reschedule your appointment if you arrive late (15 or more minutes).  Arriving late affects you and other patients whose appointments are after yours.  Also, if you miss three or more appointments without notifying the office, you may be dismissed from the clinic at the provider's discretion.      For prescription refill requests, have your pharmacy contact our office and allow 72 hours for refills to be completed.    Today you received the following chemotherapy and/or immunotherapy agents: pembrolizumab      To help prevent nausea and vomiting after your treatment, we encourage you to take your nausea medication as directed.  BELOW ARE SYMPTOMS THAT SHOULD BE REPORTED IMMEDIATELY: *FEVER GREATER THAN 100.4 F (38 C) OR HIGHER *CHILLS OR SWEATING *NAUSEA AND VOMITING THAT IS NOT CONTROLLED WITH YOUR NAUSEA MEDICATION *UNUSUAL SHORTNESS OF BREATH *UNUSUAL BRUISING OR BLEEDING *URINARY PROBLEMS (pain or burning when urinating, or frequent urination) *BOWEL PROBLEMS (unusual diarrhea, constipation, pain near the anus) TENDERNESS IN MOUTH AND THROAT WITH OR WITHOUT PRESENCE OF ULCERS (sore throat, sores in mouth, or a toothache) UNUSUAL RASH, SWELLING OR PAIN  UNUSUAL VAGINAL DISCHARGE OR ITCHING   Items with * indicate a potential emergency and should be followed up as soon as possible or go to the Emergency Department if any problems should occur.  Please show the CHEMOTHERAPY ALERT CARD or  IMMUNOTHERAPY ALERT CARD at check-in to the Emergency Department and triage nurse.  Should you have questions after your visit or need to cancel or reschedule your appointment, please contact [facility]  Dept: [phone]  and follow the prompts.  Office hours are 8:00 a.m. to 4:30 p.m. Monday - Friday. Please note that voicemails left after 4:00 p.m. may not be returned until the following business day.  We are closed weekends and major holidays. You have access to a nurse at all times for urgent questions. Please call the main number to the clinic Dept: [phone] and follow the prompts.   For any non-urgent questions, you may also contact your provider using MyChart. We now offer e-Visits for anyone 58 and older to request care online for non-urgent symptoms. For details visit [email or web address].   Also download the MyChart app! Go to the app store, search "MyChart", open the app, select [facility], and log in with your MyChart username and password.  Pembrolizumab Injection What is this medication? PEMBROLIZUMAB (PEM broe LIZ ue mab) treats some types of cancer. It works by helping your immune system slow or stop the spread of cancer cells. It is a monoclonal antibody. This medicine may be used for other purposes; ask your health care provider or pharmacist if you have questions. COMMON BRAND NAME(S): Keytruda What should I tell my care team before I take this medication? They need to know if you have any of these conditions: Allogeneic stem cell transplant (uses someone else's stem cells) Autoimmune diseases, such as  Crohn disease, ulcerative colitis, lupus History of chest radiation Nervous system problems, such as Guillain-Barre syndrome, myasthenia gravis Organ transplant An unusual or allergic reaction to pembrolizumab, other medications, foods, dyes, or preservatives Pregnant or trying to get  pregnant Breast-feeding How should I use this medication? This medication is injected into a vein. It is given by your care team in a hospital or clinic setting. A special MedGuide will be given to you before each treatment. Be sure to read this information carefully each time. Talk to your care team about the use of this medication in children. While it may be prescribed for children as young as 6 months for selected conditions, precautions do apply. Overdosage: If you think you have taken too much of this medicine contact a poison control center or emergency room at once. NOTE: This medicine is only for you. Do not share this medicine with others. What if I miss a dose? Keep appointments for follow-up doses. It is important not to miss your dose. Call your care team if you are unable to keep an appointment. What may interact with this medication? Interactions have not been studied. This list may not describe all possible interactions. Give your health care provider a list of all the medicines, herbs, non-prescription drugs, or dietary supplements you use. Also tell them if you smoke, drink alcohol, or use illegal drugs. Some items may interact with your medicine. What should I watch for while using this medication? Your condition will be monitored carefully while you are receiving this medication. You may need blood work while taking this medication. This medication may cause serious skin reactions. They can happen weeks to months after starting the medication. Contact your care team right away if you notice fevers or flu-like symptoms with a rash. The rash may be red or purple and then turn into blisters or peeling of the skin. You may also notice a red rash with swelling of the face, lips, or lymph nodes in your neck or under your arms. Tell your care team right away if you have any change in your eyesight. Talk to your care team if you may be pregnant. Serious birth defects can occur if you  take this medication during pregnancy and for 4 months after the last dose. You will need a negative pregnancy test before starting this medication. Contraception is recommended while taking this medication and for 4 months after the last dose. Your care team can help you find the option that works for you. Do not breastfeed while taking this medication and for 4 months after the last dose. What side effects may I notice from receiving this medication? Side effects that you should report to your care team as soon as possible: Allergic reactions--skin rash, itching, hives, swelling of the face, lips, tongue, or throat Dry cough, shortness of breath or trouble breathing Eye pain, redness, irritation, or discharge with blurry or decreased vision Heart muscle inflammation--unusual weakness or fatigue, shortness of breath, chest pain, fast or irregular heartbeat, dizziness, swelling of the ankles, feet, or hands Hormone gland problems--headache, sensitivity to light, unusual weakness or fatigue, dizziness, fast or irregular heartbeat, increased sensitivity to cold or heat, excessive sweating, constipation, hair loss, increased thirst or amount of urine, tremors or shaking, irritability Infusion reactions--chest pain, shortness of breath or trouble breathing, feeling faint or lightheaded Kidney injury (glomerulonephritis)--decrease in the amount of urine, red or dark brown urine, foamy or bubbly urine, swelling of the ankles, hands, or feet Liver injury--right upper belly  pain, loss of appetite, nausea, light-colored stool, dark yellow or brown urine, yellowing skin or eyes, unusual weakness or fatigue Pain, tingling, or numbness in the hands or feet, muscle weakness, change in vision, confusion or trouble speaking, loss of balance or coordination, trouble walking, seizures Rash, fever, and swollen lymph nodes Redness, blistering, peeling, or loosening of the skin, including inside the mouth Sudden or  severe stomach pain, bloody diarrhea, fever, nausea, vomiting Side effects that usually do not require medical attention (report to your care team if they continue or are bothersome): Bone, joint, or muscle pain Diarrhea Fatigue Loss of appetite Nausea Skin rash This list may not describe all possible side effects. Call your doctor for medical advice about side effects. You may report side effects to FDA at 1-800-FDA-1088. Where should I keep my medication? This medication is given in a hospital or clinic. It will not be stored at home. NOTE: This sheet is a summary. It may not cover all possible information. If you have questions about this medicine, talk to your doctor, pharmacist, or health care provider.  2024 Elsevier/Gold Standard (2021-10-05 00:00:00)

## 2023-07-04 NOTE — Progress Notes (Signed)
DISCONTINUE ON PATHWAY REGIMEN - Breast     Cycles 1 through 4: A cycle is every 21 days:     Pembrolizumab      Paclitaxel      Carboplatin      Filgrastim-xxxx    Cycles 5 through 8: A cycle is every 21 days:     Pembrolizumab      Doxorubicin      Cyclophosphamide      Pegfilgrastim-xxxx   **Always confirm dose/schedule in your pharmacy ordering system**  PRIOR TREATMENT: [id number]: Pembrolizumab 200 mg D1 + Paclitaxel 80 mg/m2 D1, 8, 15 + Carboplatin AUC=5 D1 q21 Days x 12 Weeks, Followed by Pembrolizumab 200 mg + Doxorubicin + Cyclophosphamide q21 Days x 12 Weeks, Followed by Surgery  START ON PATHWAY REGIMEN - Breast     A cycle is every 21 days:     Cyclophosphamide      Docetaxel   **Always confirm dose/schedule in your pharmacy ordering system**  Patient Characteristics: Preoperative or Nonsurgical Candidate, M0 (Clinical Staging), Up to cT4c, Any N, M0, Neoadjuvant Therapy followed by Surgery, Invasive Disease, Chemotherapy, HER2 Negative, ER Positive Therapeutic Status: Preoperative or Nonsurgical Candidate, M0 (Clinical Staging) AJCC M Category: cM0 AJCC Grade: G3 ER Status: Positive (+) AJCC 8 Stage Grouping: IIIC HER2 Status: Negative (-) AJCC T Category: cT4b AJCC N Category: cN2 PR Status: Negative (-) Breast Surgical Plan: Neoadjuvant Therapy followed by Surgery Intent of Therapy: Curative Intent, Discussed with Patient

## 2023-07-04 NOTE — Progress Notes (Signed)
Hypersensitivity Reaction note  Date of event: 07/04/23 Time of event: 1220 Generic name of drug involved: paclitaxel Name of provider notified of the hypersensitivity reaction: Dr. Archie Patten Pasam Was agent that likely caused hypersensitivity reaction added to Allergies List within EMR? yes Chain of events including reaction signs/symptoms, treatment administered, and outcome (e.g., drug resumed; drug discontinued; sent to Emergency Department; etc.) Patient had completed her first 15 minute titration of taxol and rate was increased to next titration.  Within 5 minutes, patient started to complain of her "chest feeling funny" and started to become diaphoretic.  Paclitaxel was stopped and NS was started.  VS check showed patient's BP had dropped to 91/75 (patient's baseline was 160s systolic prior to starting paclitaxel).  Patient's feet were elevated and patient was laid back in supine position.  An additional 25 mg Benadryl IV was given per hypersensitivity protocol (see MAR). Dr. Arlana Pouch  was made aware and came to assess patient. During Dr. Zenda Alpers assessment, patient's O2 start to drop into the 70s, but patient denied SOB, difficulty breathing, or wheezing, stating "my chest just feels tight". Patient placed on supplemental O2 and EKG was ordered and obtained.  Additional verbal order received to give IV solumedrol and albuterol nebulizer treatment.  After neb treatment, patient's oxygen level recovered and patient reported improvement in chest discomfort.  VS were rechecked and started to come back to normal. Patient stated she needed to go to the bathroom.  Patient was assisted to wheelchair and then assisted to bathroom.  Patient denied dizziness, SOB, upon standing, but did state her tongue "felt funny". Upon visual assessment, patient had bite marks on her tongue with minor bleeding in mouth.   Dr. Arlana Pouch was made aware of improvement in patient's VS and of the bite marks on her tongue.  Dr. Arlana Pouch came  to infusion room to reaccess patient.  Per Dr. Arlana Pouch, remainder of chemo (paclitaxel and carboplatin) were to be held.  Patient to receive additional NS bolus and ok for patient to be discharged home after bolus.  Patient was informed of Dr. Zenda Alpers response and verbalized agreement with plan.  Patient was given ice chips to help with tongue. Patient's VSS stable upon completion of fluid bolus.  Patient had stated her dad had driven her to her appointment today and would also be driving her home.  Patient was advised to contact office if she had any future questions or concerns.  Patient informed that our office would reach out to her on 07/05/23 to check in on her.  Patient verbalized understanding. Patient was assisted to lobby via wheelchair.    Nonah Mattes, RN 07/04/2023 4:01 PM

## 2023-07-04 NOTE — Assessment & Plan Note (Signed)
Patient has a history of hypertension and was previously misdiagnosed with congestive heart failure. Currently on Amlodipine once daily, Carvedilol twice daily, Spironolactone four times daily, and Furosemide once daily. -Continue current medications. -Emphasize the importance of good blood pressure control, especially in the context of planned chemotherapy.

## 2023-07-04 NOTE — Progress Notes (Signed)
[facility] CSW Progress Note  Visual merchandiser met with patient prior to first treatment to assess pyschosocial needs. Patient is continuing to work as she can, and adjusted her schedule for tomorrow to allow for extra rest. Patient inquired about medical bill she received and if BCCCP program will cover the cost. CSW consulted with Clinical cytogeneticist for [employer], patient is pending medicaid. Application was completed on 06/15/22. CSW unable to provide update to patient during visit, will follow up at more appropriate time.   Marguerita Merles, LCSW Clinical Social Worker [facility]

## 2023-07-04 NOTE — Assessment & Plan Note (Addendum)
Recently diagnosed with right breast mass (6 cm) and biopsy confirmed malignancy. Estrogen receptor weakly positive at 10%, progesterone receptor negative, and HER2 negative. Left breast has a small mass (6-8 mm) not concerning for malignancy. No lymph node involvement on biopsy but ultrasound was concerning for 3-4 right axillary lymph nodes.   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  -Discussed staging, prognosis, plan of care, treatment options.  Reviewed NCCN guidelines.  On 06/13/2023, staging PET/CT showed intense metabolic activity within the right breast mass consistent with primary breast carcinoma.  Hypermetabolic right axillary and subpectoralis lymph nodes consistent with local nodal metastasis.  No evidence of distant metastatic disease.    Given no evidence of metastatic disease, plan is to treat her with neoadjuvant chemoimmunotherapy using KEYNOTE-522 protocol (carboplatin and paclitaxel plus Keytruda for 4 cycles followed by Adriamycin, Cytoxan and Keytruda for 4 cycles followed by 9 more doses of adjuvant Keytruda).  Echocardiogram on 06/13/2023 showed preserved LV EF.  -She did have an appointment with Dr. Dwain Sarna for surgical consultation on 06/16/2023.    -She went [place] a [place] from 06/21/2023 until 06/26/2023 and deferred treatment initiation until that point.   -She had Port-A-Cath placement yesterday, 07/03/2023.  Labs today reveal no dose-limiting toxicities.  Plan made to proceed with cycle 1, day 1 of carboplatin, paclitaxel and Keytruda today, 07/04/2023.   She received Keytruda first.  Later she started Taxol and few minutes into infusion, she had severe infusion reaction with chest pain, hypotension, diaphoresis, hypoxia.  EKG was unremarkable.  Blood pressure and O2 sats normalized after extra allergy medicines and supplemental oxygen.  We will not rechallenge her with Taxol.  Plan to switch treatments to  Taxotere and cyclophosphamide regimen, to be given every 3 weeks for up to 6 cycles.  RTC in 1 week for labs, follow-up and chemo education.  Per patient preference, we will arrange for chemotherapy on Thursdays going forward, to accommodate her work schedule.

## 2023-07-04 NOTE — Progress Notes (Signed)
[facility]  ONCOLOGY CLINIC PROGRESS NOTE   Patient Care Team: Carolin Coy as PCP - General (Physician Assistant)  PATIENT NAME: Jasmine Novak   MR#: [id number] DOB: 05/18/1971  Date of visit: 07/04/2023   ASSESSMENT & PLAN:   Jasmine Novak is a 53 y.o. lady with a past medical history of hypertension, was referred to our clinic in December 2024 for newly diagnosed inflammatory carcinoma of right breast.   Inflammatory carcinoma of breast, right (HCC) Recently diagnosed with right breast mass (6 cm) and biopsy confirmed malignancy. Estrogen receptor weakly positive at 10%, progesterone receptor negative, and HER2 negative. Left breast has a small mass (6-8 mm) not concerning for malignancy. No lymph node involvement on biopsy but ultrasound was concerning for 3-4 right axillary lymph nodes.   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  -Discussed staging, prognosis, plan of care, treatment options.  Reviewed NCCN guidelines.  On 06/13/2023, staging PET/CT showed intense metabolic activity within the right breast mass consistent with primary breast carcinoma.  Hypermetabolic right axillary and subpectoralis lymph nodes consistent with local nodal metastasis.  No evidence of distant metastatic disease.    Given no evidence of metastatic disease, plan is to treat her with neoadjuvant chemoimmunotherapy using KEYNOTE-522 protocol (carboplatin and paclitaxel plus Keytruda for 4 cycles followed by Adriamycin, Cytoxan and Keytruda for 4 cycles followed by 9 more doses of adjuvant Keytruda).  Echocardiogram on 06/13/2023 showed preserved LV EF.  -She did have an appointment with Dr. Dwain Sarna for surgical consultation on 06/16/2023.    -She went [place] from 06/21/2023 until 06/26/2023 and deferred treatment initiation until that point.   -She had Port-A-Cath placement yesterday, 07/03/2023.  Labs  today reveal no dose-limiting toxicities.  Plan made to proceed with cycle 1, day 1 of carboplatin, paclitaxel and Keytruda today, 07/04/2023.   She received Keytruda first.  Later she started Taxol and few minutes into infusion, she had severe infusion reaction with chest pain, hypotension, diaphoresis, hypoxia.  EKG was unremarkable.  Blood pressure and O2 sats normalized after extra allergy medicines and supplemental oxygen.  We will not rechallenge her with Taxol.  Plan to switch treatments to Taxotere and cyclophosphamide regimen, to be given every 3 weeks for up to 6 cycles.  RTC in 1 week for labs, follow-up and chemo education.  Per patient preference, we will arrange for chemotherapy on Thursdays going forward, to accommodate her work schedule.  Resistant hypertension Patient has a history of hypertension and was previously misdiagnosed with congestive heart failure. Currently on Amlodipine once daily, Carvedilol twice daily, Spironolactone four times daily, and Furosemide once daily. -Continue current medications. -Emphasize the importance of good blood pressure control, especially in the context of planned chemotherapy.  Cancer associated pain Patient reports discomfort and tenderness in the area of the right breast mass. -Previously provided prescription for Tramadol for pain management as needed, with instructions for potential side effects (drowsiness, constipation) and the use of a stool softener if constipation occurs.    I reviewed lab results and outside records for this visit and discussed relevant results with the patient. Diagnosis, plan of care and treatment options were also discussed in detail with the patient. Opportunity provided to ask questions and answers provided to her apparent satisfaction. Provided instructions to call our clinic with any problems, questions or concerns prior to return visit. I recommended to continue follow-up with PCP and sub-specialists. She  verbalized understanding and agreed with the plan.   NCCN guidelines have been consulted in the planning of this patient's care.  I spent a total of 50 minutes during this encounter with the patient including review of chart and various tests results, discussions about plan of care and coordination of care plan.   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  Current Treatment: Plan for neoadjuvant chemotherapy using KEYNOTE-522 protocol (carboplatin and paclitaxel plus Keytruda for 4 cycles followed by Adriamycin, Cytoxan and Keytruda for 4 cycles followed by 9 more doses of adjuvant Keytruda).  Started cycle 1 of chemotherapy on 07/04/2023.  She received Keytruda first.  Later she started Taxol and few minutes into infusion, she had severe infusion reaction with chest pain, hypotension, diaphoresis, hypoxia.  EKG was unremarkable.  Blood pressure and O2 sats normalized after extra allergy medicines and supplemental oxygen.  We will not rechallenge her with Taxol.  Plan to switch treatments to Taxotere and cyclophosphamide regimen, to be given every 3 weeks for up to 6 cycles.  INTERVAL HISTORY:    Discussed the use of AI scribe software for clinical note transcription with the patient, who gave verbal consent to proceed.   Jasmine Novak is here today for repeat clinical assessment.   She had port placed yesterday. She reports no complications from the procedure. She has picked up all prescribed medications, including dexamethasone and as-needed nausea medications. She has been provided with a schedule for her upcoming  treatments and understands the regimen.  She feels well overall and denies any new complaints today.  Pain is controlled with tramadol.  I have reviewed the past medical history, past surgical history, social history and family history with the patient and they are unchanged from previous note.  HISTORY OF PRESENT ILLNESS:   ONCOLOGY HISTORY:  The patient has not been getting regular mammograms previously and has not seen a primary doctor recently.    She reports lump in her right breast that she noticed in mid-October 2024. The lump was causing discomfort, particularly when she is moving or wearing a bra. The patient reports that the area around the lump is tender and warm to the touch. She has been taking ibuprofen to manage the discomfort. She is right-handed and has been trying to avoid using her right arm due to the discomfort from the lump.    Since pain and swelling persisted, she presented to the ED on 05/07/2023.  A CT chest with contrast was obtained on 05/07/2023 which showed 5 x 4.2 cm mass within the right breast with diffuse cutaneous thickening overlying the right breast, highly suspicious for primary breast malignancy.  Right axillary lymphadenopathy measuring up to 13 mm, suspicious for metastatic disease.  A 3 mm subpleural right upper lobe lung nodule, indeterminate. Coronary artery calcifications and left atrial enlargement was also noted.   ED provider reached out to our clinic for further assistance and management.  We arranged for diagnostic mammogram, ultrasound and biopsy.   On 05/22/2023, diagnostic mammogram showed 5.3 cm highly suspicious mass in the right breast associated with adjacent inflammation and overlying skin thickening consistent with inflammatory  breast carcinoma.  3-4 abnormal right axillary lymph nodes, suspicious for metastatic disease.  2 adjacent benign clusters of cysts in the left breast.  No evidence of left breast malignancy.  Ultrasound showed similar  findings.   On 05/23/2023 she underwent core needle biopsy of right breast mass.  Pathology showed invasive ductal carcinoma, mitotic score 3, grade 3.  ER weakly positive at 10%.  PR negative, HER2/neu was 2+ by IHC, negative by FISH.  High Ki-67 index of 80%.   With these findings, she presented to clinic to establish care with [place] on 06/06/2023.  Request submitted for PET/CT and echocardiogram.   On 06/13/2023, staging PET/CT showed intense metabolic activity within the right breast mass consistent with primary breast carcinoma.  Hypermetabolic right axillary and subpectoralis lymph nodes consistent with local nodal metastasis.  No evidence of distant metastatic disease.    Given no evidence of metastatic disease, plan is to treat her with neoadjuvant chemoimmunotherapy using KEYNOTE-522 protocol (carboplatin and paclitaxel plus Keytruda for 4 cycles followed by Adriamycin, Cytoxan and Keytruda for 4 cycles followed by 9 more doses of adjuvant Keytruda).  Started systemic treatments from 07/04/2023.   Oncology History  Inflammatory carcinoma of breast, right (HCC)  06/08/2023 Initial Diagnosis   Inflammatory carcinoma of breast, right (HCC)   06/15/2023 Cancer Staging   Staging form: Breast, AJCC 8th Edition - Clinical: Stage IIIC (cT4b, cN2, cM0, G3, ER+, PR-, HER2-) - Signed by Meryl Crutch, MD on 06/15/2023 Histologic grading system: 3 grade system   07/04/2023 - 07/04/2023 Chemotherapy   Patient is on Treatment Plan : BREAST Pembrolizumab (200) D1 + Carboplatin (5) D1 + Paclitaxel (80) D1,8,15 q21d X 4 cycles / Pembrolizumab (200) D1 + AC D1 q21d x 4 cycles     07/13/2023 -  Chemotherapy   Patient is on Treatment Plan : BREAST TC q21d         REVIEW OF SYSTEMS:   Review of Systems - Oncology  All other pertinent systems were reviewed with the patient and are negative.  ALLERGIES: She is allergic to paclitaxel, lisinopril, and fosaprepitant.  MEDICATIONS:  Current Outpatient  Medications  Medication Sig Dispense Refill   amLODipine (NORVASC) 10 MG tablet Take 1 tablet (10 mg total) by mouth daily. 90 tablet 0   carvedilol (COREG) 12.5 MG tablet Take 12.5 mg by mouth.     furosemide (LASIX) 40 MG tablet TAKE 1 TABLET(40 MG) BY MOUTH DAILY 30 tablet 2   ibuprofen (ADVIL) 200 MG tablet Take 200 mg by mouth every 6 (six) hours as needed.     irbesartan (AVAPRO) 300 MG tablet Take by mouth.     spironolactone (ALDACTONE) 25 MG tablet Take by mouth.     traMADol (ULTRAM) 50 MG tablet Take 1 tablet (50 mg total) by mouth every 12 (twelve) hours as needed. 60 tablet 0   dexamethasone (DECADRON) 4 MG tablet Take 2 tabs by mouth 2 times daily starting day before chemo. Then take 2 tabs daily for 2 days starting day after chemo. Take with food. 30 tablet 1   lidocaine-prilocaine (EMLA) cream Apply to affected area once 30 g 3   ondansetron (ZOFRAN) 8 MG tablet Take 1 tablet (8 mg total) by mouth every 8 (eight) hours as needed for nausea or vomiting. Start on the third day after chemotherapy. 30 tablet 1   prochlorperazine (COMPAZINE) 10 MG tablet Take 1 tablet (10 mg total) by mouth every 6 (six) hours as needed for nausea or vomiting.  30 tablet 1   No current facility-administered medications for this visit.   Facility-Administered Medications Ordered in Other Visits  Medication Dose Route Frequency Provider Last Rate Last Admin   0.9 %  sodium chloride infusion   Intravenous Once Hudsen Fei, MD         VITALS:   Blood pressure (!) 158/80, pulse 78, temperature (!) 97.5 F (36.4 C), temperature source Oral, resp. rate 18, height 5\' 6"  (1.676 m), weight (!) 321 lb 14.4 oz (146 kg), last menstrual period 01/17/2022, SpO2 98%.  Wt Readings from Last 3 Encounters:  07/04/23 (!) 321 lb 14.4 oz (146 kg)  07/03/23 (!) 322 lb (146.1 kg)  06/19/23 (!) 323 lb 6.4 oz (146.7 kg)    Body mass index is 51.96 kg/m.  Performance status (ECOG): 1 - Symptomatic but completely  ambulatory   Onc Performance Status - 07/04/23 0859       ECOG Perf Status   ECOG Perf Status Restricted in physically strenuous activity but ambulatory and able to carry out work of a light or sedentary nature, e.g., light house work, office work      KPS SCALE   KPS % SCORE Normal, no compliants, no evidence of disease              PHYSICAL EXAM:   Physical Exam Constitutional:      General: She is not in acute distress.    Appearance: Normal appearance.  HENT:     Head: Normocephalic and atraumatic.  Eyes:     General: No scleral icterus.    Conjunctiva/sclera: Conjunctivae normal.  Cardiovascular:     Rate and Rhythm: Normal rate and regular rhythm.     Heart sounds: Normal heart sounds.  Pulmonary:     Effort: Pulmonary effort is normal.     Breath sounds: Normal breath sounds.  Chest:     Comments: Breast exam deferred today.  Please see notes dated 06/06/2023. Abdominal:     General: There is no distension.  Musculoskeletal:     Right lower leg: No edema.     Left lower leg: No edema.  Neurological:     General: No focal deficit present.     Mental Status: She is alert and oriented to person, place, and time.  Psychiatric:        Mood and Affect: Mood normal.        Behavior: Behavior normal.        Thought Content: Thought content normal.      LABORATORY DATA:   I have reviewed the data as listed.  Results for orders placed or performed in visit on 07/04/23  TSH  Result Value Ref Range   TSH 1.430 0.350 - 4.500 uIU/mL  CMP (Cancer Center only)  Result Value Ref Range   Sodium 137 135 - 145 mmol/L   Potassium 3.6 3.5 - 5.1 mmol/L   Chloride 103 98 - 111 mmol/L   CO2 28 22 - 32 mmol/L   Glucose, Bld 148 (H) 70 - 99 mg/dL   BUN 15 6 - 20 mg/dL   Creatinine 1.91 4.78 - 1.00 mg/dL   Calcium 8.9 8.9 - 29.5 mg/dL   Total Protein 7.9 6.5 - 8.1 g/dL   Albumin 3.6 3.5 - 5.0 g/dL   AST 14 (L) 15 - 41 U/L   ALT 16 0 - 44 U/L   Alkaline Phosphatase  94 38 - 126 U/L   Total Bilirubin 0.4 0.0 - 1.2 mg/dL  GFR, Estimated >60 >60 mL/min   Anion gap 6 5 - 15  CBC with Differential (Cancer Center Only)  Result Value Ref Range   WBC Count 7.0 4.0 - 10.5 K/uL   RBC 4.30 3.87 - 5.11 MIL/uL   Hemoglobin 11.6 (L) 12.0 - 15.0 g/dL   HCT 13.0 86.5 - 78.4 %   MCV 84.4 80.0 - 100.0 fL   MCH 27.0 26.0 - 34.0 pg   MCHC 32.0 30.0 - 36.0 g/dL   RDW 69.6 29.5 - 28.4 %   Platelet Count 249 150 - 400 K/uL   nRBC 0.0 0.0 - 0.2 %   Neutrophils Relative % 67 %   Neutro Abs 4.7 1.7 - 7.7 K/uL   Lymphocytes Relative 21 %   Lymphs Abs 1.5 0.7 - 4.0 K/uL   Monocytes Relative 10 %   Monocytes Absolute 0.7 0.1 - 1.0 K/uL   Eosinophils Relative 2 %   Eosinophils Absolute 0.1 0.0 - 0.5 K/uL   Basophils Relative 0 %   Basophils Absolute 0.0 0.0 - 0.1 K/uL   Immature Granulocytes 0 %   Abs Immature Granulocytes 0.02 0.00 - 0.07 K/uL    Lab Results  Component Value Date   WBC 7.0 07/04/2023   NEUTROABS 4.7 07/04/2023   HGB 11.6 (L) 07/04/2023   HCT 36.3 07/04/2023   MCV 84.4 07/04/2023   PLT 249 07/04/2023       Component Value Date/Time   NA 137 07/04/2023 0835   NA 140 05/23/2018 0929   K 3.6 07/04/2023 0835   CL 103 07/04/2023 0835   CO2 28 07/04/2023 0835   GLUCOSE 148 (H) 07/04/2023 0835   BUN 15 07/04/2023 0835   BUN 12 05/23/2018 0929   CREATININE 0.77 07/04/2023 0835   CREATININE 1.00 06/27/2016 1355   CALCIUM 8.9 07/04/2023 0835   PROT 7.9 07/04/2023 0835   PROT 7.8 05/23/2018 0929   ALBUMIN 3.6 07/04/2023 0835   ALBUMIN 4.1 05/23/2018 0929   AST 14 (L) 07/04/2023 0835   ALT 16 07/04/2023 0835   ALKPHOS 94 07/04/2023 0835   BILITOT 0.4 07/04/2023 0835   GFRNONAA >60 07/04/2023 0835   GFRNONAA >89 06/08/2016 0938   GFRAA 84 05/23/2018 0929   GFRAA >89 06/08/2016 0938    RADIOGRAPHIC STUDIES:  I have personally reviewed the radiological images as listed and agree with the findings in the report.  IR IMAGING GUIDED PORT  INSERTION Result Date: 07/04/2023 CLINICAL DATA:  Right breast carcinoma and need for porta cath to begin chemotherapy. EXAM: IMPLANTED PORT A CATH PLACEMENT WITH ULTRASOUND AND FLUOROSCOPIC GUIDANCE ANESTHESIA/SEDATION: Moderate (conscious) sedation was employed during this procedure. A total of Versed 2.0 mg and Fentanyl 100 mcg was administered intravenously. Moderate Sedation Time: 43 minutes. The patient's level of consciousness and vital signs were monitored continuously by radiology nursing throughout the procedure under my direct supervision. FLUOROSCOPY: 1 minute and 24 seconds.  25.0 mGy. PROCEDURE: The procedure, risks, benefits, and alternatives were explained to the patient. Questions regarding the procedure were encouraged and answered. The patient understands and consents to the procedure. A time-out was performed prior to initiating the procedure. Ultrasound was utilized to confirm patency of the left internal jugular vein. An ultrasound image was saved and recorded. The left neck and chest were prepped with chlorhexidine in a sterile fashion, and a sterile drape was applied covering the operative field. Maximum barrier sterile technique with sterile gowns and gloves were used for the procedure. Local anesthesia was  provided with 1% lidocaine. After creating a small venotomy incision, a 21 gauge needle was advanced into the left internal jugular vein under direct, real-time ultrasound guidance. Ultrasound image documentation was performed. After securing guidewire access, an 8 Fr dilator was placed. A J-wire was kinked to measure appropriate catheter length. A subcutaneous port pocket was then created along the upper chest wall utilizing sharp and blunt dissection. Portable cautery was utilized. The pocket was irrigated with sterile saline. A single lumen power injectable port was chosen for placement. The 8 Fr catheter was tunneled from the port pocket site to the venotomy incision. The port was  placed in the pocket. External catheter was trimmed to appropriate length based on guidewire measurement. At the venotomy, an 8 Fr peel-away sheath was placed over a guidewire. The catheter was then placed through the sheath and the sheath removed. Final catheter positioning was confirmed and documented with a fluoroscopic spot image. The port was accessed with a needle and aspirated and flushed with heparinized saline. The access needle was removed. The venotomy and port pocket incisions were closed with subcutaneous 3-0 Monocryl and subcuticular 4-0 Vicryl. Dermabond was applied to both incisions. COMPLICATIONS: COMPLICATIONS None FINDINGS: After catheter placement, the tip lies at the cavo-atrial junction. The catheter aspirates normally and is ready for immediate use. IMPRESSION: Placement of single lumen port a cath via left internal jugular vein. The catheter tip lies at the cavo-atrial junction. A power injectable port a cath was placed and is ready for immediate use. Electronically Signed   By: Irish Lack M.D.   On: 07/04/2023 10:51   NM PET Image Initial (PI) Skull Base To Thigh Result Date: 06/14/2023 CLINICAL DATA:  Initial treatment strategy for RIGHT breast carcinoma. EXAM: NUCLEAR MEDICINE PET SKULL BASE TO THIGH TECHNIQUE: 15.9 mCi F-18 FDG was injected intravenously. Full-ring PET imaging was performed from the skull base to thigh after the radiotracer. CT data was obtained and used for attenuation correction and anatomic localization. Fasting blood glucose: 78 mg/dl COMPARISON:  None Available. FINDINGS: NECK: No hypermetabolic lymph nodes in the neck. CHEST: Intense metabolic activity within a 6.8 cm RIGHT breast mass with SUV max equal 35.8. Several hypermetabolic RIGHT axial lymph nodes. For example 17 mm node on image 61 with SUV max equal 5.3. There are 2 axillary hypermetabolic nodes in 1 sub pectoralis node. Hypermetabolic sub pectoralis node on image 67. No central thoracic  hypermetabolic lymph nodes. No suspicious pulmonary nodules Incidental CT findings: None. ABDOMEN/PELVIS: No abnormal hypermetabolic activity within the liver, pancreas, adrenal glands, or spleen. No hypermetabolic lymph nodes in the abdomen or pelvis. Incidental CT findings: Nonobstructing RIGHT renal calculus measures 10 mm. Uterus and adnexa unremarkable. SKELETON: No focal hypermetabolic activity to suggest skeletal metastasis. Incidental CT findings: None. IMPRESSION: 1. Intense metabolic activity within the RIGHT breast mass consistent with primary breast carcinoma. 2. Hypermetabolic RIGHT axillary and sub pectoralis lymph nodes consistent with local nodal metastasis. 3. No evidence of distant metastatic disease. 4. Nonobstructing RIGHT renal calculus. Electronically Signed   By: Genevive Bi M.D.   On: 06/14/2023 10:12   ECHOCARDIOGRAM COMPLETE Result Date: 06/13/2023    ECHOCARDIOGRAM REPORT   Patient Name:   MATILYNN DACEY Date of Exam: 06/13/2023 Medical Rec #:  [id number]     Height:       66.0 in Accession #:    [id number]    Weight:       330.7 lb Date of Birth:  1970-08-30  BSA:          2.476 m Patient Age:    52 years      BP:           174/81 mmHg Patient Gender: F             HR:           81 bpm. Exam Location:  Outpatient Procedure: 2D Echo, Cardiac Doppler and Color Doppler Indications:    Chemo Z09  History:        Patient has no prior history of Echocardiogram examinations.  Sonographer:    Harriette Bouillon RDCS Referring Phys: [id number] Leily Capek IMPRESSIONS  1. Left ventricular ejection fraction, by estimation, is 55 to 60%. The left ventricle has normal function. Left ventricular endocardial border not optimally defined to evaluate regional wall motion. There is moderate concentric left ventricular hypertrophy. Left ventricular diastolic parameters are indeterminate.  2. Right ventricular systolic function is normal. The right ventricular size is not well visualized. Tricuspid  regurgitation signal is inadequate for assessing PA pressure.  3. The mitral valve was not well visualized. No evidence of mitral valve regurgitation. No evidence of mitral stenosis.  4. The aortic valve was not well visualized. Aortic valve regurgitation is not visualized. No aortic stenosis is present.  5. The inferior vena cava is normal in size with greater than 50% respiratory variability, suggesting right atrial pressure of 3 mmHg. Comparison(s): No significant change from prior study. Conclusion(s)/Recommendation(s): Technically challenging study, limited acoustic windows. Valves not well seen but are normal by Doppler assessment. Grossly normal LV function, though regional wall motion abnormalities cannot be excluded based on this study. FINDINGS  Left Ventricle: Left ventricular ejection fraction, by estimation, is 55 to 60%. The left ventricle has normal function. Left ventricular endocardial border not optimally defined to evaluate regional wall motion. The left ventricular internal cavity size was normal in size. There is moderate concentric left ventricular hypertrophy. Left ventricular diastolic parameters are indeterminate. Right Ventricle: The right ventricular size is not well visualized. Right vetricular wall thickness was not well visualized. Right ventricular systolic function is normal. Tricuspid regurgitation signal is inadequate for assessing PA pressure. Left Atrium: Left atrial size was not well visualized. Right Atrium: Right atrial size was not well visualized. Pericardium: Trivial pericardial effusion is present. Mitral Valve: The mitral valve was not well visualized. No evidence of mitral valve regurgitation. No evidence of mitral valve stenosis. Tricuspid Valve: The tricuspid valve is not well visualized. Tricuspid valve regurgitation is not demonstrated. No evidence of tricuspid stenosis. Aortic Valve: The aortic valve was not well visualized. Aortic valve regurgitation is not  visualized. No aortic stenosis is present. Pulmonic Valve: The pulmonic valve was not well visualized. Pulmonic valve regurgitation is not visualized. Aorta: The aortic root and ascending aorta are structurally normal, with no evidence of dilitation. Venous: The inferior vena cava is normal in size with greater than 50% respiratory variability, suggesting right atrial pressure of 3 mmHg. IAS/Shunts: The interatrial septum was not well visualized.  LEFT VENTRICLE PLAX 2D LVIDd:         4.90 cm   Diastology LVIDs:         3.20 cm   LV e' medial:    6.53 cm/s LV PW:         1.60 cm   LV E/e' medial:  12.8 LV IVS:        1.70 cm   LV e' lateral:   8.38 cm/s LVOT  diam:     2.40 cm   LV E/e' lateral: 10.0 LV SV:         67 LV SV Index:   27 LVOT Area:     4.52 cm  RIGHT VENTRICLE             IVC RV S prime:     16.40 cm/s  IVC diam: 1.70 cm TAPSE (M-mode): 2.3 cm LEFT [facility]           Index LA diam:      4.30 cm 1.74 cm/m LA Vol (A4C): 49.0 ml 19.79 ml/m  AORTIC VALVE LVOT Vmax:   76.30 cm/s LVOT Vmean:  49.700 cm/s LVOT VTI:    0.148 m  AORTA Ao Root diam: 3.00 cm Ao Asc diam:  3.30 cm MITRAL VALVE MV Area (PHT): 3.54 cm    SHUNTS MV Decel Time: 214 msec    Systemic VTI:  0.15 m MV E velocity: 83.40 cm/s  Systemic Diam: 2.40 cm MV A velocity: 80.00 cm/s MV E/A ratio:  1.04 Jodelle Red MD Electronically signed by Jodelle Red MD Signature Date/Time: 06/13/2023/4:04:04 PM    Final     CODE STATUS:  Code Status History     Date Active Date Inactive Code Status Order ID Comments User Context   11/13/2015 0949 11/15/2015 1650 Full Code [id number]  Barnetta Chapel, MD Inpatient       Orders Placed This Encounter  Procedures   Consent Attestation for Oncology Treatment    The patient is informed of risks, benefits, side-effects of the prescribed oncology treatment. Potential short term and long term side effects and response rates discussed. After a long discussion, the patient made informed  decision to proceed.:   Yes   CBC with Differential (Cancer Center Only)    Standing Status:   Future    Expected Date:   07/13/2023    Expiration Date:   07/12/2024   CMP (Cancer Center only)    Standing Status:   Future    Expected Date:   07/13/2023    Expiration Date:   07/12/2024   CBC with Differential (Cancer Center Only)    Standing Status:   Future    Expected Date:   08/03/2023    Expiration Date:   08/02/2024   CMP (Cancer Center only)    Standing Status:   Future    Expected Date:   08/03/2023    Expiration Date:   08/02/2024   CBC with Differential (Cancer Center Only)    Standing Status:   Future    Expected Date:   08/24/2023    Expiration Date:   08/23/2024   CMP (Cancer Center only)    Standing Status:   Future    Expected Date:   08/24/2023    Expiration Date:   08/23/2024   CBC with Differential (Cancer Center Only)    Standing Status:   Future    Expected Date:   09/14/2023    Expiration Date:   09/13/2024   CMP (Cancer Center only)    Standing Status:   Future    Expected Date:   09/14/2023    Expiration Date:   09/13/2024   CBC with Differential (Cancer Center Only)    Standing Status:   Future    Expected Date:   10/05/2023    Expiration Date:   10/04/2024   CMP (Cancer Center only)    Standing Status:   Future    Expected Date:  10/05/2023    Expiration Date:   10/04/2024   CBC with Differential (Cancer Center Only)    Standing Status:   Future    Expected Date:   10/26/2023    Expiration Date:   10/25/2024   CMP (Cancer Center only)    Standing Status:   Future    Expected Date:   10/26/2023    Expiration Date:   10/25/2024     Future Appointments  Date Time Provider Department Center  07/11/2023 10:30 AM [facility] PHLEBOTOMIST [facility] None  07/11/2023 10:45 AM [facility] INFUSION [facility] None  07/11/2023 11:00 AM Kinzee Happel, MD [facility] None  07/13/2023 12:45 PM [facility] INFUSION [facility] None  07/15/2023 12:15 PM [facility] FLUSH [facility] None  08/01/2023 10:45  AM [facility] PHLEBOTOMIST [facility] None  08/01/2023 11:15 AM [facility] INFUSION [facility] None  08/01/2023 11:40 AM Titiana Severa, MD [facility] None  08/03/2023 11:00 AM [facility] INFUSION [facility] None  08/05/2023  1:15 PM [facility] FLUSH [facility] None  08/22/2023  9:15 AM [facility] PHLEBOTOMIST [facility] None  08/22/2023  9:30 AM [facility] INFUSION [facility] None  08/22/2023  9:50 AM Perri Aragones, MD [facility] None  08/24/2023 12:45 PM [facility] INFUSION [facility] None  08/26/2023 12:00 PM [facility] FLUSH [facility] None  09/21/2023 11:00 AM Koerner, Cari M [facility] None  09/21/2023 12:00 PM [facility] LAB [facility] None      This document was completed utilizing speech recognition software. Grammatical errors, random word insertions, pronoun errors, and incomplete sentences are an occasional consequence of this system due to software limitations, ambient noise, and hardware issues. Any formal questions or concerns about the content, text or information contained within the body of this dictation should be directly addressed to the provider for clarification.

## 2023-07-04 NOTE — Progress Notes (Signed)
Patient seen by Dr. Archie Patten Pasam today  Vitals are within treatment parameters: Blood Pressure- 158/80 Okay to proceed  Labs are within treatment parameters: Yes  AST- 14 Okay to proceed Treatment plan has been signed: Yes   Per physician team, Patient is ready for treatment and there are NO modifications to the treatment plan.

## 2023-07-05 ENCOUNTER — Encounter: Payer: Self-pay | Admitting: *Deleted

## 2023-07-05 ENCOUNTER — Encounter: Payer: Self-pay | Admitting: Oncology

## 2023-07-05 LAB — T4: T4, Total: 7.6 ug/dL (ref 4.5–12.0)

## 2023-07-05 NOTE — Progress Notes (Signed)
PATIENT NAVIGATOR PROGRESS NOTE  Name: Jasmine Novak Date: 07/05/2023 MRN: [id number]  DOB: 1970-10-29   Reason for visit:  F/U after first treatment  Comments:  Spoke with patient and discussed her treatment and subsequent reaction yesterday. Discussed upcoming new treatment and plan to educate at appt on 2/4 ahead of treatment on 2/6.   Encouraged her to call with any questions or issues     Time spent counseling/coordinating care: 30-45 minutes

## 2023-07-06 ENCOUNTER — Telehealth: Payer: Self-pay

## 2023-07-06 ENCOUNTER — Other Ambulatory Visit: Payer: Self-pay | Admitting: Oncology

## 2023-07-06 DIAGNOSIS — G893 Neoplasm related pain (acute) (chronic): Secondary | ICD-10-CM

## 2023-07-06 NOTE — Telephone Encounter (Signed)
[employer] approved x 12 months (05/07/2023-05/05/2024), Retroactive 05/07/2023, MID# [id number]. Patient informed, also discussed renewal process.

## 2023-07-07 ENCOUNTER — Telehealth: Payer: Self-pay

## 2023-07-07 ENCOUNTER — Encounter: Payer: Self-pay | Admitting: Oncology

## 2023-07-07 NOTE — Telephone Encounter (Signed)
Returned phone call in regards to not being able to pick up Tramadol prescription from [facility] pharmacy. Made aware spoke with lisa pharmacy tech stated needed a prior authorization from [facility] before prescription can be filled. Alternative would be self pay. Patient stated just took last dose of pain medication. Advised either could wait on approval from Medicaid or self pay. Made aware since medication decreases pain, not to wait long until picking up from pharmacy so pain does not become severe. Understood, no further questions.

## 2023-07-08 ENCOUNTER — Encounter: Payer: Self-pay | Admitting: Oncology

## 2023-07-10 ENCOUNTER — Telehealth: Payer: Self-pay | Admitting: Oncology

## 2023-07-10 NOTE — Telephone Encounter (Signed)
Our clinic nurse brought it to my attention that patient called the on-call provider over the weekend on 07/08/2023 for some concerns regarding purplish discoloration near the site of her port on the left side.  I called the patient today for further evaluation.  States that the port site looks purplish but denies any pain, oozing, pus discharge.  Also denies any pain in the neck or in the left arm.  No fevers, chills, night sweats or other concerns at this time.  She is scheduled to see me tomorrow, 07/11/2023.  We will see her as scheduled for further evaluation.  Patient was advised to go to the ED in case of any fever overnight or any other concerning symptoms.  She verbalized understanding.

## 2023-07-11 ENCOUNTER — Telehealth: Payer: Self-pay

## 2023-07-11 ENCOUNTER — Inpatient Hospital Stay: Payer: Medicaid Other

## 2023-07-11 ENCOUNTER — Encounter: Payer: Self-pay | Admitting: Oncology

## 2023-07-11 ENCOUNTER — Other Ambulatory Visit: Payer: Self-pay | Admitting: *Deleted

## 2023-07-11 ENCOUNTER — Inpatient Hospital Stay: Payer: Self-pay | Attending: Oncology

## 2023-07-11 ENCOUNTER — Inpatient Hospital Stay (HOSPITAL_BASED_OUTPATIENT_CLINIC_OR_DEPARTMENT_OTHER): Payer: Self-pay | Admitting: Oncology

## 2023-07-11 VITALS — BP 148/94 | HR 75 | Temp 97.7°F | Resp 18 | Ht 66.0 in | Wt 316.6 lb

## 2023-07-11 DIAGNOSIS — Z5111 Encounter for antineoplastic chemotherapy: Secondary | ICD-10-CM | POA: Insufficient documentation

## 2023-07-11 DIAGNOSIS — I1A0 Resistant hypertension: Secondary | ICD-10-CM | POA: Insufficient documentation

## 2023-07-11 DIAGNOSIS — C50911 Malignant neoplasm of unspecified site of right female breast: Secondary | ICD-10-CM

## 2023-07-11 DIAGNOSIS — Z452 Encounter for adjustment and management of vascular access device: Secondary | ICD-10-CM | POA: Insufficient documentation

## 2023-07-11 DIAGNOSIS — Z5189 Encounter for other specified aftercare: Secondary | ICD-10-CM | POA: Insufficient documentation

## 2023-07-11 DIAGNOSIS — I110 Hypertensive heart disease with heart failure: Secondary | ICD-10-CM | POA: Insufficient documentation

## 2023-07-11 DIAGNOSIS — I509 Heart failure, unspecified: Secondary | ICD-10-CM | POA: Diagnosis not present

## 2023-07-11 DIAGNOSIS — C50411 Malignant neoplasm of upper-outer quadrant of right female breast: Secondary | ICD-10-CM | POA: Insufficient documentation

## 2023-07-11 DIAGNOSIS — R110 Nausea: Secondary | ICD-10-CM | POA: Diagnosis not present

## 2023-07-11 DIAGNOSIS — Z170 Estrogen receptor positive status [ER+]: Secondary | ICD-10-CM | POA: Insufficient documentation

## 2023-07-11 DIAGNOSIS — G893 Neoplasm related pain (acute) (chronic): Secondary | ICD-10-CM | POA: Insufficient documentation

## 2023-07-11 DIAGNOSIS — T451X5D Adverse effect of antineoplastic and immunosuppressive drugs, subsequent encounter: Secondary | ICD-10-CM

## 2023-07-11 LAB — CBC WITH DIFFERENTIAL (CANCER CENTER ONLY)
Abs Immature Granulocytes: 0.07 10*3/uL (ref 0.00–0.07)
Basophils Absolute: 0 10*3/uL (ref 0.0–0.1)
Basophils Relative: 0 %
Eosinophils Absolute: 0.2 10*3/uL (ref 0.0–0.5)
Eosinophils Relative: 2 %
HCT: 37.1 % (ref 36.0–46.0)
Hemoglobin: 11.9 g/dL — ABNORMAL LOW (ref 12.0–15.0)
Immature Granulocytes: 1 %
Lymphocytes Relative: 22 %
Lymphs Abs: 1.7 10*3/uL (ref 0.7–4.0)
MCH: 26.9 pg (ref 26.0–34.0)
MCHC: 32.1 g/dL (ref 30.0–36.0)
MCV: 83.7 fL (ref 80.0–100.0)
Monocytes Absolute: 0.7 10*3/uL (ref 0.1–1.0)
Monocytes Relative: 9 %
Neutro Abs: 5 10*3/uL (ref 1.7–7.7)
Neutrophils Relative %: 66 %
Platelet Count: 211 10*3/uL (ref 150–400)
RBC: 4.43 MIL/uL (ref 3.87–5.11)
RDW: 14.9 % (ref 11.5–15.5)
WBC Count: 7.6 10*3/uL (ref 4.0–10.5)
nRBC: 0 % (ref 0.0–0.2)

## 2023-07-11 LAB — CMP (CANCER CENTER ONLY)
ALT: 16 U/L (ref 0–44)
AST: 13 U/L — ABNORMAL LOW (ref 15–41)
Albumin: 3.7 g/dL (ref 3.5–5.0)
Alkaline Phosphatase: 87 U/L (ref 38–126)
Anion gap: 6 (ref 5–15)
BUN: 11 mg/dL (ref 6–20)
CO2: 27 mmol/L (ref 22–32)
Calcium: 8.8 mg/dL — ABNORMAL LOW (ref 8.9–10.3)
Chloride: 102 mmol/L (ref 98–111)
Creatinine: 0.75 mg/dL (ref 0.44–1.00)
GFR, Estimated: >60 mL/min (ref >60)
Glucose, Bld: 101 mg/dL — ABNORMAL HIGH (ref 70–99)
Potassium: 4 mmol/L (ref 3.5–5.1)
Sodium: 135 mmol/L (ref 135–145)
Total Bilirubin: 0.5 mg/dL (ref 0.0–1.2)
Total Protein: 7.8 g/dL (ref 6.5–8.1)

## 2023-07-11 MED ORDER — HEPARIN SOD (PORK) LOCK FLUSH 100 UNIT/ML IV SOLN: 500.0000 [IU] | Freq: Once | INTRAVENOUS | Status: DC

## 2023-07-11 MED ORDER — SODIUM CHLORIDE 0.9% FLUSH
10.0000 mL | Freq: Once | INTRAVENOUS | Status: AC
  Administered 2023-07-11: 10 mL via INTRAVENOUS

## 2023-07-11 NOTE — Progress Notes (Signed)
 [facility]  ONCOLOGY CLINIC PROGRESS NOTE   Patient Care Team: Sharl Tully [name] as PCP - General (Physician Assistant)  PATIENT NAME: Jasmine Novak   MR#: [id number] DOB: 1971/04/02  Date of visit: 07/11/2023   ASSESSMENT & PLAN:   Jasmine Novak is a 53 y.o. lady with a past medical history of hypertension, was referred to our clinic in December 2024 for newly diagnosed inflammatory carcinoma of right breast.   Inflammatory carcinoma of breast, right (HCC) Recently diagnosed with right breast mass (6 cm) and biopsy confirmed malignancy. Estrogen receptor weakly positive at 10%, progesterone receptor negative, and HER2 negative. Left breast has a small mass (6-8 mm) not concerning for malignancy. No lymph node involvement on biopsy but ultrasound was concerning for 3-4 right axillary lymph nodes.   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  -Previously I discussed staging, prognosis, plan of care, treatment options.  Reviewed NCCN guidelines.  On 06/13/2023, staging PET/CT showed intense metabolic activity within the right breast mass consistent with primary breast carcinoma.  Hypermetabolic right axillary and subpectoralis lymph nodes consistent with local nodal metastasis.  No evidence of distant metastatic disease.    Given no evidence of metastatic disease, plan made to treat her with neoadjuvant chemoimmunotherapy using KEYNOTE-522 protocol (carboplatin  and paclitaxel  plus Keytruda  for 4 cycles followed by Adriamycin , Cytoxan  and Keytruda  for 4 cycles followed by 9 more doses of adjuvant Keytruda ).  Echocardiogram on 06/13/2023 showed preserved LV EF.  -She did have an appointment with Dr. Ebbie for surgical consultation on 06/16/2023.    -She went [place] a [place] from 06/21/2023 until 06/26/2023 and deferred treatment initiation until that point.   -She had Port-A-Cath placement on  07/03/2023.  Plan made to proceed with cycle 1, day 1 of carboplatin , paclitaxel  and Keytruda  on 07/04/2023. She received Keytruda  first.  Later she started Taxol  and few minutes into infusion, she had severe infusion reaction with chest pain, hypotension, diaphoresis, hypoxia.  EKG was unremarkable.  Blood pressure and O2 sats normalized after additional allergy medication and supplemental oxygen.  We will not rechallenge her with Taxol .  Plan to switch treatments to Taxotere  and cyclophosphamide  regimen, to be given every 3 weeks for up to 6 cycles.  We discussed side effect profile with this regimen.  She is tentatively scheduled to begin cycle 1 on 07/13/2023.  She will receive Neulasta  on day 3 of each cycle.  (She will receive this at our [facility]).  Per patient preference, we will arrange for chemotherapy on Thursdays going forward, to accommodate her work schedule.  Encounter for care related to Port-a-Cath Sudden extensive bruising at the port site without pain or swelling. Possible deep port complicating blood draws and chemotherapy administration.   - Order fluoroscopy to investigate port positioning.  This is currently scheduled on 07/13/2023.  She is scheduled for cycle 1 of TC later that day.  - Consider using a longer needle if port is confirmed deep.  Adverse effect of paclitaxel  Severe allergic reaction to Taxol  on 07/04/2023, including chest pain, hypotension, and hypoxemia. Managed with allergy medications.   We will not rechallenge her with Taxol  given the severity of reaction.    I reviewed lab results and outside records for this visit and discussed relevant results with the patient. Diagnosis, plan of care and treatment options were also discussed in detail with the patient. Opportunity provided to ask questions and answers provided  to her apparent satisfaction. Provided instructions to call our clinic with any problems, questions or concerns prior to return visit.  I recommended to continue follow-up with PCP and sub-specialists. She verbalized understanding and agreed with the plan.   NCCN guidelines have been consulted in the planning of this patient's care.  I spent a total of 40 minutes during this encounter with the patient including review of chart and various tests results, discussions about plan of care and coordination of care plan.   [name], MD  07/11/2023 4:24 PM  [facility] - A DEPT OF [facility] [address] 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  Current Treatment: Plan made initially for neoadjuvant chemotherapy using KEYNOTE-522 protocol (carboplatin  and paclitaxel  plus Keytruda  for 4 cycles followed by Adriamycin , Cytoxan  and Keytruda  for 4 cycles followed by 9 more doses of adjuvant Keytruda ).  Started cycle 1 of chemotherapy on 07/04/2023.  She received Keytruda  first.  Later she started Taxol  and few minutes into infusion, she had severe infusion reaction with chest pain, hypotension, diaphoresis, hypoxia.  EKG was unremarkable.  Blood pressure and O2 sats normalized after additional allergy medication and supplemental oxygen.  We will not rechallenge her with Taxol .  Plan made to switch treatments to Taxotere  and cyclophosphamide  regimen, to be given every 3 weeks for up to 6 cycles.  INTERVAL HISTORY:    Discussed the use of AI scribe software for clinical note transcription with the patient, who gave verbal consent to proceed.   Jasmine Novak is here today for repeat clinical assessment.   Last week when she presented for chemotherapy initiation, she had severe reaction to Taxol  and developed chest pain, hypertension, diaphoresis,  hypoxia.  Blood pressure and O2 sats normalized after additional allergy medications and supplemental oxygen.  EKG was unremarkable.   Since the reaction, the patient has not experienced any further chest tightness.  The patient also reports bruising around the area of her port, which started suddenly one morning. The bruising is not painful or swollen, but is more extensive than what is typically seen with a port. The patient denies any redness in the area.  I have reviewed the past medical history, past surgical history, social history and family history with the patient and they are unchanged from previous note.  HISTORY OF PRESENT ILLNESS:   ONCOLOGY HISTORY:  The patient has not been getting regular mammograms previously and has not seen a primary doctor recently.    She reports lump in her right breast that she noticed in mid-October 2024. The lump was causing discomfort, particularly when she is moving or wearing a bra. The patient reports that the area around the lump is tender and warm to the touch. She has been taking ibuprofen  to manage the discomfort. She is right-handed and has been trying to avoid using her right arm due to the discomfort from the lump.    Since pain and swelling persisted, she presented to the ED on 05/07/2023.  A CT chest with contrast was obtained on 05/07/2023 which showed 5 x 4.2 cm mass within the right breast with diffuse cutaneous thickening overlying the right breast, highly suspicious for primary breast malignancy.  Right axillary lymphadenopathy measuring up to 13 mm, suspicious  for metastatic disease.  A 3 mm subpleural right upper lobe lung nodule, indeterminate. Coronary artery calcifications and left atrial enlargement was also noted.   ED provider reached out to our clinic for further assistance and management.  We arranged for diagnostic mammogram, ultrasound and biopsy.   On 05/22/2023, diagnostic mammogram showed 5.3 cm highly suspicious mass in the  right breast associated with adjacent inflammation and overlying skin thickening consistent with inflammatory breast carcinoma.  3-4 abnormal right axillary lymph nodes, suspicious for metastatic disease.  2 adjacent benign clusters of cysts in the left breast.  No evidence of left breast malignancy.  Ultrasound showed similar findings.   On 05/23/2023 she underwent core needle biopsy of right breast mass.  Pathology showed invasive ductal carcinoma, mitotic score 3, grade 3.  ER weakly positive at 10%.  PR negative, HER2/neu was 2+ by IHC, negative by FISH.  High Ki-67 index of 80%.   With these findings, she presented to clinic to establish care with us  on 06/06/2023.  Request submitted for PET/CT and echocardiogram.   On 06/13/2023, staging PET/CT showed intense metabolic activity within the right breast mass consistent with primary breast carcinoma.  Hypermetabolic right axillary and subpectoralis lymph nodes consistent with local nodal metastasis.  No evidence of distant metastatic disease.    Given no evidence of metastatic disease, plan is to treat her with neoadjuvant chemoimmunotherapy using KEYNOTE-522 protocol (carboplatin  and paclitaxel  plus Keytruda  for 4 cycles followed by Adriamycin , Cytoxan  and Keytruda  for 4 cycles followed by 9 more doses of adjuvant Keytruda ).  Started systemic treatments from 07/04/2023.   Oncology History  Inflammatory carcinoma of breast, right (HCC)  06/08/2023 Initial Diagnosis   Inflammatory carcinoma of breast, right (HCC)   06/15/2023 Cancer Staging   Staging form: Breast, AJCC 8th Edition - Clinical: Stage IIIC (cT4b, cN2, cM0, G3, ER+, PR-, HER2-) - Signed by Autumn Millman, MD on 06/15/2023 Histologic grading system: 3 grade system   07/04/2023 - 07/04/2023 Chemotherapy   Patient is on Treatment Plan : BREAST Pembrolizumab  (200) D1 + Carboplatin  (5) D1 + Paclitaxel  (80) D1,8,15 q21d X 4 cycles / Pembrolizumab  (200) D1 + AC D1 q21d x 4 cycles     07/13/2023 -   Chemotherapy   Patient is on Treatment Plan : BREAST TC q21d         REVIEW OF SYSTEMS:   Review of Systems - Oncology  All other pertinent systems were reviewed with the patient and are negative.  ALLERGIES: She is allergic to paclitaxel , lisinopril , and fosaprepitant .  MEDICATIONS:  Current Outpatient Medications  Medication Sig Dispense Refill   amLODipine  (NORVASC ) 10 MG tablet Take 1 tablet (10 mg total) by mouth daily. 90 tablet 0   carvedilol  (COREG ) 12.5 MG tablet Take 12.5 mg by mouth.     furosemide  (LASIX ) 40 MG tablet TAKE 1 TABLET(40 MG) BY MOUTH DAILY 30 tablet 2   ibuprofen  (ADVIL ) 200 MG tablet Take 200 mg by mouth every 6 (six) hours as needed.     irbesartan (AVAPRO) 300 MG tablet Take by mouth.     spironolactone (ALDACTONE) 25 MG tablet Take by mouth.     traMADol  (ULTRAM ) 50 MG tablet Take 1 tablet (50 mg total) by mouth every 6 (six) hours as needed. 90 tablet 0   dexamethasone  (DECADRON ) 4 MG tablet Take 2 tabs by mouth 2 times daily starting day before chemo. Then take 2 tabs daily for 2 days starting day after chemo. Take with food. (Patient not  taking: Reported on 07/11/2023) 30 tablet 1   lidocaine -prilocaine  (EMLA ) cream Apply to affected area once (Patient not taking: Reported on 07/11/2023) 30 g 3   ondansetron  (ZOFRAN ) 8 MG tablet Take 1 tablet (8 mg total) by mouth every 8 (eight) hours as needed for nausea or vomiting. Start on the third day after chemotherapy. (Patient not taking: Reported on 07/11/2023) 30 tablet 1   prochlorperazine  (COMPAZINE ) 10 MG tablet Take 1 tablet (10 mg total) by mouth every 6 (six) hours as needed for nausea or vomiting. (Patient not taking: Reported on 07/11/2023) 30 tablet 1   No current facility-administered medications for this visit.     VITALS:   Blood pressure (!) 148/94, pulse 75, temperature 97.7 F (36.5 C), temperature source Oral, resp. rate 18, height 5' 6 (1.676 m), weight (!) 316 lb 9.6 oz (143.6 kg), last  menstrual period 01/17/2022, SpO2 99%.  Wt Readings from Last 3 Encounters:  07/11/23 (!) 316 lb 9.6 oz (143.6 kg)  07/04/23 (!) 321 lb 14.4 oz (146 kg)  07/03/23 (!) 322 lb (146.1 kg)    Body mass index is 51.1 kg/m.   Onc Performance Status - 07/11/23 1123       ECOG Perf Status   ECOG Perf Status Restricted in physically strenuous activity but ambulatory and able to carry out work of a light or sedentary nature, e.g., light house work, office work      KPS SCALE   KPS % SCORE Normal, no compliants, no evidence of disease               PHYSICAL EXAM:   Physical Exam Constitutional:      General: She is not in acute distress.    Appearance: Normal appearance.  HENT:     Head: Normocephalic and atraumatic.  Eyes:     General: No scleral icterus.    Conjunctiva/sclera: Conjunctivae normal.  Cardiovascular:     Rate and Rhythm: Normal rate and regular rhythm.     Heart sounds: Normal heart sounds.  Pulmonary:     Effort: Pulmonary effort is normal.     Breath sounds: Normal breath sounds.  Chest:     Comments: At least 6.5 cm firm mass palpable in the lower, outer quadrant of right breast. Peau [place]Or[place] skin changes noted.  No inversion of nipple.  Right axillary fullness noted without definite lymphadenopathy palpable.  Bruising in various stages of healing noted in the upper half of left breast.  Left-sided Port-A-Cath in place, deep-seated.  Some bruising noted on the surface.  No tenderness. Abdominal:     General: There is no distension.  Musculoskeletal:     Right lower leg: No edema.     Left lower leg: No edema.  Neurological:     General: No focal deficit present.     Mental Status: She is alert and oriented to person, place, and time.  Psychiatric:        Mood and Affect: Mood normal.        Behavior: Behavior normal.        Thought Content: Thought content normal.      LABORATORY DATA:   I have reviewed the data as listed.  Results for  orders placed or performed in visit on 07/11/23  CMP (Cancer Center only)  Result Value Ref Range   Sodium 135 135 - 145 mmol/L   Potassium 4.0 3.5 - 5.1 mmol/L   Chloride 102 98 - 111 mmol/L   CO2 27  22 - 32 mmol/L   Glucose, Bld 101 (H) 70 - 99 mg/dL   BUN 11 6 - 20 mg/dL   Creatinine 9.24 9.55 - 1.00 mg/dL   Calcium 8.8 (L) 8.9 - 10.3 mg/dL   Total Protein 7.8 6.5 - 8.1 g/dL   Albumin 3.7 3.5 - 5.0 g/dL   AST 13 (L) 15 - 41 U/L   ALT 16 0 - 44 U/L   Alkaline Phosphatase 87 38 - 126 U/L   Total Bilirubin 0.5 0.0 - 1.2 mg/dL   GFR, Estimated >39 >39 mL/min   Anion gap 6 5 - 15  CBC with Differential (Cancer Center Only)  Result Value Ref Range   WBC Count 7.6 4.0 - 10.5 K/uL   RBC 4.43 3.87 - 5.11 MIL/uL   Hemoglobin 11.9 (L) 12.0 - 15.0 g/dL   HCT 62.8 63.9 - 53.9 %   MCV 83.7 80.0 - 100.0 fL   MCH 26.9 26.0 - 34.0 pg   MCHC 32.1 30.0 - 36.0 g/dL   RDW 85.0 88.4 - 84.4 %   Platelet Count 211 150 - 400 K/uL   nRBC 0.0 0.0 - 0.2 %   Neutrophils Relative % 66 %   Neutro Abs 5.0 1.7 - 7.7 K/uL   Lymphocytes Relative 22 %   Lymphs Abs 1.7 0.7 - 4.0 K/uL   Monocytes Relative 9 %   Monocytes Absolute 0.7 0.1 - 1.0 K/uL   Eosinophils Relative 2 %   Eosinophils Absolute 0.2 0.0 - 0.5 K/uL   Basophils Relative 0 %   Basophils Absolute 0.0 0.0 - 0.1 K/uL   Immature Granulocytes 1 %   Abs Immature Granulocytes 0.07 0.00 - 0.07 K/uL      RADIOGRAPHIC STUDIES:  I have personally reviewed the radiological images as listed and agree with the findings in the report.  IR IMAGING GUIDED PORT INSERTION Result Date: 07/04/2023 CLINICAL DATA:  Right breast carcinoma and need for porta cath to begin chemotherapy. EXAM: IMPLANTED PORT A CATH PLACEMENT WITH ULTRASOUND AND FLUOROSCOPIC GUIDANCE ANESTHESIA/SEDATION: Moderate (conscious) sedation was employed during this procedure. A total of Versed  2.0 mg and Fentanyl  100 mcg was administered intravenously. Moderate Sedation Time: 43  minutes. The patient's level of consciousness and vital signs were monitored continuously by radiology nursing throughout the procedure under my direct supervision. FLUOROSCOPY: 1 minute and 24 seconds.  25.0 mGy. PROCEDURE: The procedure, risks, benefits, and alternatives were explained to the patient. Questions regarding the procedure were encouraged and answered. The patient understands and consents to the procedure. A time-out was performed prior to initiating the procedure. Ultrasound was utilized to confirm patency of the left internal jugular vein. An ultrasound image was saved and recorded. The left neck and chest were prepped with chlorhexidine in a sterile fashion, and a sterile drape was applied covering the operative field. Maximum barrier sterile technique with sterile gowns and gloves were used for the procedure. Local anesthesia was provided with 1% lidocaine . After creating a small venotomy incision, a 21 gauge needle was advanced into the left internal jugular vein under direct, real-time ultrasound guidance. Ultrasound image documentation was performed. After securing guidewire access, an 8 Fr dilator was placed. A J-wire was kinked to measure appropriate catheter length. A subcutaneous port pocket was then created along the upper chest wall utilizing sharp and blunt dissection. Portable cautery was utilized. The pocket was irrigated with sterile saline. A single lumen power injectable port was chosen for placement. The 8 Fr catheter was  tunneled from the port pocket site to the venotomy incision. The port was placed in the pocket. External catheter was trimmed to appropriate length based on guidewire measurement. At the venotomy, an 8 Fr peel-away sheath was placed over a guidewire. The catheter was then placed through the sheath and the sheath removed. Final catheter positioning was confirmed and documented with a fluoroscopic spot image. The port was accessed with a needle and aspirated and  flushed with heparinized saline. The access needle was removed. The venotomy and port pocket incisions were closed with subcutaneous 3-0 Monocryl and subcuticular 4-0 Vicryl. Dermabond was applied to both incisions. COMPLICATIONS: COMPLICATIONS None FINDINGS: After catheter placement, the tip lies at the cavo-atrial junction. The catheter aspirates normally and is ready for immediate use. IMPRESSION: Placement of single lumen port a cath via left internal jugular vein. The catheter tip lies at the cavo-atrial junction. A power injectable port a cath was placed and is ready for immediate use. Electronically Signed   By: Marcey Moan M.D.   On: 07/04/2023 10:51   NM PET Image Initial (PI) Skull Base To Thigh Result Date: 06/14/2023 CLINICAL DATA:  Initial treatment strategy for RIGHT breast carcinoma. EXAM: NUCLEAR MEDICINE PET SKULL BASE TO THIGH TECHNIQUE: 15.9 mCi F-18 FDG was injected intravenously. Full-ring PET imaging was performed from the skull base to thigh after the radiotracer. CT data was obtained and used for attenuation correction and anatomic localization. Fasting blood glucose: 78 mg/dl COMPARISON:  None Available. FINDINGS: NECK: No hypermetabolic lymph nodes in the neck. CHEST: Intense metabolic activity within a 6.8 cm RIGHT breast mass with SUV max equal 35.8. Several hypermetabolic RIGHT axial lymph nodes. For example 17 mm node on image 61 with SUV max equal 5.3. There are 2 axillary hypermetabolic nodes in 1 sub pectoralis node. Hypermetabolic sub pectoralis node on image 67. No central thoracic hypermetabolic lymph nodes. No suspicious pulmonary nodules Incidental CT findings: None. ABDOMEN/PELVIS: No abnormal hypermetabolic activity within the liver, pancreas, adrenal glands, or spleen. No hypermetabolic lymph nodes in the abdomen or pelvis. Incidental CT findings: Nonobstructing RIGHT renal calculus measures 10 mm. Uterus and adnexa unremarkable. SKELETON: No focal hypermetabolic  activity to suggest skeletal metastasis. Incidental CT findings: None. IMPRESSION: 1. Intense metabolic activity within the RIGHT breast mass consistent with primary breast carcinoma. 2. Hypermetabolic RIGHT axillary and sub pectoralis lymph nodes consistent with local nodal metastasis. 3. No evidence of distant metastatic disease. 4. Nonobstructing RIGHT renal calculus. Electronically Signed   By: Jackquline Boxer M.D.   On: 06/14/2023 10:12   ECHOCARDIOGRAM COMPLETE Result Date: 06/13/2023    ECHOCARDIOGRAM REPORT   Patient Name:   Jasmine Novak Date of Exam: 06/13/2023 Medical Rec #:  [id number]     Height:       66.0 in Accession #:    [id number]    Weight:       330.7 lb Date of Birth:  04/03/1971     BSA:          2.476 m Patient Age:    52 years      BP:           174/81 mmHg Patient Gender: F             HR:           81 bpm. Exam Location:  Outpatient Procedure: 2D Echo, Cardiac Doppler and Color Doppler Indications:    Chemo Z09  History:        Patient has  no prior history of Echocardiogram examinations.  Sonographer:    Tinnie Gosling RDCS Referring Phys: [id number] Tasmine Hipwell IMPRESSIONS  1. Left ventricular ejection fraction, by estimation, is 55 to 60%. The left ventricle has normal function. Left ventricular endocardial border not optimally defined to evaluate regional wall motion. There is moderate concentric left ventricular hypertrophy. Left ventricular diastolic parameters are indeterminate.  2. Right ventricular systolic function is normal. The right ventricular size is not well visualized. Tricuspid regurgitation signal is inadequate for assessing PA pressure.  3. The mitral valve was not well visualized. No evidence of mitral valve regurgitation. No evidence of mitral stenosis.  4. The aortic valve was not well visualized. Aortic valve regurgitation is not visualized. No aortic stenosis is present.  5. The inferior vena cava is normal in size with greater than 50% respiratory variability,  suggesting right atrial pressure of 3 mmHg. Comparison(s): No significant change from prior study. Conclusion(s)/Recommendation(s): Technically challenging study, limited acoustic windows. Valves not well seen but are normal by Doppler assessment. Grossly normal LV function, though regional wall motion abnormalities cannot be excluded based on this study. FINDINGS  Left Ventricle: Left ventricular ejection fraction, by estimation, is 55 to 60%. The left ventricle has normal function. Left ventricular endocardial border not optimally defined to evaluate regional wall motion. The left ventricular internal cavity size was normal in size. There is moderate concentric left ventricular hypertrophy. Left ventricular diastolic parameters are indeterminate. Right Ventricle: The right ventricular size is not well visualized. Right vetricular wall thickness was not well visualized. Right ventricular systolic function is normal. Tricuspid regurgitation signal is inadequate for assessing PA pressure. Left Atrium: Left atrial size was not well visualized. Right Atrium: Right atrial size was not well visualized. Pericardium: Trivial pericardial effusion is present. Mitral Valve: The mitral valve was not well visualized. No evidence of mitral valve regurgitation. No evidence of mitral valve stenosis. Tricuspid Valve: The tricuspid valve is not well visualized. Tricuspid valve regurgitation is not demonstrated. No evidence of tricuspid stenosis. Aortic Valve: The aortic valve was not well visualized. Aortic valve regurgitation is not visualized. No aortic stenosis is present. Pulmonic Valve: The pulmonic valve was not well visualized. Pulmonic valve regurgitation is not visualized. Aorta: The aortic root and ascending aorta are structurally normal, with no evidence of dilitation. Venous: The inferior vena cava is normal in size with greater than 50% respiratory variability, suggesting right atrial pressure of 3 mmHg. IAS/Shunts: The  interatrial septum was not well visualized.  LEFT VENTRICLE PLAX 2D LVIDd:         4.90 cm   Diastology LVIDs:         3.20 cm   LV e' medial:    6.53 cm/s LV PW:         1.60 cm   LV E/e' medial:  12.8 LV IVS:        1.70 cm   LV e' lateral:   8.38 cm/s LVOT diam:     2.40 cm   LV E/e' lateral: 10.0 LV SV:         67 LV SV Index:   27 LVOT Area:     4.52 cm  RIGHT VENTRICLE             IVC RV S prime:     16.40 cm/s  IVC diam: 1.70 cm TAPSE (M-mode): 2.3 cm LEFT ATRIUM           Index LA diam:      4.30 cm  1.74 cm/m LA Vol (A4C): 49.0 ml 19.79 ml/m  AORTIC VALVE LVOT Vmax:   76.30 cm/s LVOT Vmean:  49.700 cm/s LVOT VTI:    0.148 m  AORTA Ao Root diam: 3.00 cm Ao Asc diam:  3.30 cm MITRAL VALVE MV Area (PHT): 3.54 cm    SHUNTS MV Decel Time: 214 msec    Systemic VTI:  0.15 m MV E velocity: 83.40 cm/s  Systemic Diam: 2.40 cm MV A velocity: 80.00 cm/s MV E/A ratio:  1.04 Shelda Bruckner MD Electronically signed by Shelda Bruckner MD Signature Date/Time: 06/13/2023/4:04:04 PM    Final     CODE STATUS:  Code Status History     Date Active Date Inactive Code Status Order ID Comments User Context   11/13/2015 0949 11/15/2015 1650 Full Code [id number]  Rosario Eland I, MD Inpatient       No orders of the defined types were placed in this encounter.    Future Appointments  Date Time Provider Department Center  07/13/2023  8:00 AM [facility] 1 [facility]  07/13/2023 12:45 PM [facility] INFUSION [facility] None  07/15/2023 12:15 PM [facility] FLUSH [facility] None  08/01/2023 10:45 AM [facility] PHLEBOTOMIST [facility] None  08/01/2023 11:15 AM [facility] INFUSION [facility] None  08/01/2023 11:40 AM Crisanto Nied, MD [facility] None  08/03/2023 11:00 AM [facility] INFUSION [facility] None  08/05/2023  1:15 PM [facility] FLUSH [facility] None  08/22/2023  9:15 AM [facility] PHLEBOTOMIST [facility] None  08/22/2023  9:30 AM [facility] INFUSION [facility] None  08/22/2023  9:50 AM Rhys Lichty, MD [facility] None   08/24/2023 12:45 PM [facility] INFUSION [facility] None  08/26/2023 12:00 PM [facility] FLUSH [facility] None  09/21/2023 11:00 AM Koerner, Cari M [facility] None  09/21/2023 12:00 PM [facility] LAB [facility] None      This document was completed utilizing speech recognition software. Grammatical errors, random word insertions, pronoun errors, and incomplete sentences are an occasional consequence of this system due to software limitations, ambient noise, and hardware issues. Any formal questions or concerns about the content, text or information contained within the body of this dictation should be directly addressed to the provider for clarification.

## 2023-07-11 NOTE — Assessment & Plan Note (Signed)
Severe allergic reaction to Taxol on 07/04/2023, including chest pain, hypotension, and hypoxemia. Managed with allergy medications.   We will not rechallenge her with Taxol given the severity of reaction.

## 2023-07-11 NOTE — Assessment & Plan Note (Deleted)
 Sudden extensive bruising at the port site without pain or swelling. Possible deep port complicating blood draws and chemotherapy administration.   - Order fluoroscopy to investigate port positioning.  This is currently scheduled on 07/13/2023.  She is scheduled for cycle 1 of TC later that day.  - Consider using a longer needle if port is confirmed deep.

## 2023-07-11 NOTE — Progress Notes (Signed)
[facility] unable to accommodate the IV dye study. Called [facility]--are checking schedule. [facility] called back and will do study on 2/6 at 0745/0800. Dr. Zenda Alpers nurse aware and will reach out to patient.

## 2023-07-11 NOTE — Telephone Encounter (Signed)
Spoke with Mother in regards to IR CV line injection being scheduled at [facility] on 07/13/23, arrive at 0745, appointment at 0800, allowed to eat beforehand. Understood, no further questions, would relay message to patient.

## 2023-07-11 NOTE — Assessment & Plan Note (Signed)
 Sudden extensive bruising at the port site without pain or swelling. Possible deep port complicating blood draws and chemotherapy administration.   - Order fluoroscopy to investigate port positioning.  This is currently scheduled on 07/13/2023.  She is scheduled for cycle 1 of TC later that day.  - Consider using a longer needle if port is confirmed deep.

## 2023-07-11 NOTE — Telephone Encounter (Signed)
Left a voicemail to call the Cancer Center in regards to IR CV Line Injection being scheduled at [facility] 07/13/23, arrive at 0745, appointment at 0800, allowed to eat beforehand.

## 2023-07-11 NOTE — Assessment & Plan Note (Addendum)
 Recently diagnosed with right breast mass (6 cm) and biopsy confirmed malignancy. Estrogen receptor weakly positive at 10%, progesterone receptor negative, and HER2 negative. Left breast has a small mass (6-8 mm) not concerning for malignancy. No lymph node involvement on biopsy but ultrasound was concerning for 3-4 right axillary lymph nodes.   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  -Previously I discussed staging, prognosis, plan of care, treatment options.  Reviewed NCCN guidelines.  On 06/13/2023, staging PET/CT showed intense metabolic activity within the right breast mass consistent with primary breast carcinoma.  Hypermetabolic right axillary and subpectoralis lymph nodes consistent with local nodal metastasis.  No evidence of distant metastatic disease.    Given no evidence of metastatic disease, plan made to treat her with neoadjuvant chemoimmunotherapy using KEYNOTE-522 protocol (carboplatin  and paclitaxel  plus Keytruda  for 4 cycles followed by Adriamycin , Cytoxan  and Keytruda  for 4 cycles followed by 9 more doses of adjuvant Keytruda ).  Echocardiogram on 06/13/2023 showed preserved LV EF.  -She did have an appointment with Dr. Ebbie for surgical consultation on 06/16/2023.    -[place] went [place] from 06/21/2023 until 06/26/2023 and deferred treatment initiation until that point.   -She had Port-A-Cath placement on 07/03/2023.  Plan made to proceed with cycle 1, day 1 of carboplatin , paclitaxel  and Keytruda  on 07/04/2023. She received Keytruda  first.  Later she started Taxol  and few minutes into infusion, she had severe infusion reaction with chest pain, hypotension, diaphoresis, hypoxia.  EKG was unremarkable.  Blood pressure and O2 sats normalized after additional allergy medication and supplemental oxygen.  We will not rechallenge her with Taxol .  Plan to switch treatments to Taxotere  and cyclophosphamide  regimen, to  be given every 3 weeks for up to 6 cycles.  We discussed side effect profile with this regimen.  She is tentatively scheduled to begin cycle 1 on 07/13/2023.  She will receive Neulasta  on day 3 of each cycle.  (She will receive this at our [facility]).  Per patient preference, we will arrange for chemotherapy on Thursdays going forward, to accommodate her work schedule.

## 2023-07-11 NOTE — Progress Notes (Signed)
MD requesting IV dye study for PAC. Order placed and left VM for [facility] to schedule.

## 2023-07-12 ENCOUNTER — Telehealth: Payer: Self-pay | Admitting: *Deleted

## 2023-07-12 ENCOUNTER — Encounter: Payer: Self-pay | Admitting: Oncology

## 2023-07-12 ENCOUNTER — Other Ambulatory Visit (HOSPITAL_COMMUNITY): Payer: Self-pay | Admitting: Student

## 2023-07-12 NOTE — Telephone Encounter (Signed)
 Ms. Vespa left VM late 2/4 asking about her 2/6 Dye study appointment. Called her back with no answer. LVM that she needs to arrive at [facility] radiology at 0745 for study at 0800 to determine if port is functioning properly before she can have chemo that afternoon. Does not need to fast for procedure. Gave office number to call with any further questions.

## 2023-07-13 ENCOUNTER — Ambulatory Visit: Payer: No Typology Code available for payment source

## 2023-07-13 ENCOUNTER — Ambulatory Visit (HOSPITAL_COMMUNITY)
Admission: RE | Admit: 2023-07-13 | Discharge: 2023-07-13 | Disposition: A | Discharge status: Home / Self Care | Payer: MEDICAID | Source: Ambulatory Visit | Attending: Oncology | Admitting: Oncology

## 2023-07-13 ENCOUNTER — Other Ambulatory Visit: Payer: Self-pay | Admitting: Oncology

## 2023-07-13 ENCOUNTER — Other Ambulatory Visit: Payer: Self-pay | Admitting: Radiology

## 2023-07-13 ENCOUNTER — Inpatient Hospital Stay: Payer: Medicaid Other

## 2023-07-13 VITALS — BP 156/89 | HR 91 | Temp 98.1°F | Resp 18

## 2023-07-13 DIAGNOSIS — C50911 Malignant neoplasm of unspecified site of right female breast: Secondary | ICD-10-CM

## 2023-07-13 DIAGNOSIS — Z5111 Encounter for antineoplastic chemotherapy: Secondary | ICD-10-CM | POA: Diagnosis not present

## 2023-07-13 DIAGNOSIS — Z452 Encounter for adjustment and management of vascular access device: Secondary | ICD-10-CM

## 2023-07-13 HISTORY — PX: IR CV LINE INJECTION: IMG2294

## 2023-07-13 MED ORDER — SODIUM CHLORIDE 0.9 % IV SOLN: Freq: Once | INTRAVENOUS | Status: DC | PRN

## 2023-07-13 MED ORDER — IOHEXOL 300 MG/ML  SOLN
50.0000 mL | Freq: Once | INTRAMUSCULAR | Status: AC | PRN
  Administered 2023-07-13: 5 mL

## 2023-07-13 MED ORDER — DIPHENHYDRAMINE HCL 50 MG/ML IJ SOLN
50.0000 mg | Freq: Once | INTRAMUSCULAR | Status: AC | PRN
  Administered 2023-07-13: 25 mg via INTRAVENOUS

## 2023-07-13 MED ORDER — PALONOSETRON HCL INJECTION 0.25 MG/5ML
0.2500 mg | Freq: Once | INTRAVENOUS | Status: AC
  Administered 2023-07-13: 0.25 mg via INTRAVENOUS
  Filled 2023-07-13: qty 5

## 2023-07-13 MED ORDER — DEXAMETHASONE SODIUM PHOSPHATE 10 MG/ML IJ SOLN
10.0000 mg | Freq: Once | INTRAMUSCULAR | Status: AC
  Administered 2023-07-13: 10 mg via INTRAVENOUS
  Filled 2023-07-13: qty 1

## 2023-07-13 MED ORDER — METHYLPREDNISOLONE SODIUM SUCC 125 MG IJ SOLR
125.0000 mg | Freq: Once | INTRAMUSCULAR | Status: AC | PRN
  Administered 2023-07-13: 125 mg via INTRAVENOUS

## 2023-07-13 MED ORDER — SODIUM CHLORIDE 0.9 % IV SOLN: INTRAVENOUS | Status: DC

## 2023-07-13 MED ORDER — SODIUM CHLORIDE 0.9 % IV SOLN
600.0000 mg/m2 | Freq: Once | INTRAVENOUS | Status: AC
  Administered 2023-07-13: 1500 mg via INTRAVENOUS
  Filled 2023-07-13: qty 50

## 2023-07-13 MED ORDER — SODIUM CHLORIDE 0.9 % IV SOLN
75.0000 mg/m2 | Freq: Once | INTRAVENOUS | Status: AC
  Administered 2023-07-13: 196 mg via INTRAVENOUS
  Filled 2023-07-13: qty 19.6

## 2023-07-13 MED ORDER — FAMOTIDINE IN NACL 20-0.9 MG/50ML-% IV SOLN
20.0000 mg | Freq: Once | INTRAVENOUS | Status: AC | PRN
  Administered 2023-07-13: 20 mg via INTRAVENOUS

## 2023-07-13 MED ORDER — HEPARIN SOD (PORK) LOCK FLUSH 100 UNIT/ML IV SOLN
INTRAVENOUS | Status: AC
Start: 2023-07-13 — End: ?
  Filled 2023-07-13: qty 5

## 2023-07-13 NOTE — Patient Instructions (Signed)
 [facility] - A DEPT OF [facility]   Discharge Instructions: Thank you for choosing [facility] to provide your oncology and hematology care.   If you have a lab appointment with the Cancer Center, please go directly to the Cancer Center and check in at the registration area.   Wear comfortable clothing and clothing appropriate for easy access to any Portacath or PICC line.   We strive to give you quality time with your provider. You may need to reschedule your appointment if you arrive late (15 or more minutes).  Arriving late affects you and other patients whose appointments are after yours.  Also, if you miss three or more appointments without notifying the office, you may be dismissed from the clinic at the provider's discretion.      For prescription refill requests, have your pharmacy contact our office and allow 72 hours for refills to be completed.    Today you received the following chemotherapy and/or immunotherapy agents Cytoxan       To help prevent nausea and vomiting after your treatment, we encourage you to take your nausea medication as directed.  BELOW ARE SYMPTOMS THAT SHOULD BE REPORTED IMMEDIATELY: *FEVER GREATER THAN 100.4 F (38 C) OR HIGHER *CHILLS OR SWEATING *NAUSEA AND VOMITING THAT IS NOT CONTROLLED WITH YOUR NAUSEA MEDICATION *UNUSUAL SHORTNESS OF BREATH *UNUSUAL BRUISING OR BLEEDING *URINARY PROBLEMS (pain or burning when urinating, or frequent urination) *BOWEL PROBLEMS (unusual diarrhea, constipation, pain near the anus) TENDERNESS IN MOUTH AND THROAT WITH OR WITHOUT PRESENCE OF ULCERS (sore throat, sores in mouth, or a toothache) UNUSUAL RASH, SWELLING OR PAIN  UNUSUAL VAGINAL DISCHARGE OR ITCHING   Items with * indicate a potential emergency and should be followed up as soon as possible or go to the Emergency Department if any problems should occur.  Please show the CHEMOTHERAPY ALERT CARD or IMMUNOTHERAPY  ALERT CARD at check-in to the Emergency Department and triage nurse.  Should you have questions after your visit or need to cancel or reschedule your appointment, please contact [facility] - A DEPT OF [facility]  Dept: [phone]  and follow the prompts.  Office hours are 8:00 a.m. to 4:30 p.m. Monday - Friday. Please note that voicemails left after 4:00 p.m. may not be returned until the following business day.  We are closed weekends and major holidays. You have access to a nurse at all times for urgent questions. Please call the main number to the clinic Dept: [phone] and follow the prompts.   For any non-urgent questions, you may also contact your provider using MyChart. We now offer e-Visits for anyone 57 and older to request care online for non-urgent symptoms. For details visit [email or web address].   Also download the MyChart app! Go to the app store, search MyChart, open the app, select [place], and log in with your MyChart username and password.  Cyclophosphamide  Injection What is this medication? CYCLOPHOSPHAMIDE  (sye kloe FOSS fa mide) treats some types of cancer. It works by slowing down the growth of cancer cells. This medicine may be used for other purposes; ask your health care provider or pharmacist if you have questions. COMMON BRAND NAME(S): Cyclophosphamide , Cytoxan , Neosar  What should I tell my care team before I take this medication? They need to know if you have any of these conditions: Heart disease Irregular heartbeat or rhythm Infection Kidney problems Liver disease Low blood cell levels (white cells, platelets, or red  blood cells) Lung disease Previous radiation Trouble passing urine An unusual or allergic reaction to cyclophosphamide , other medications, foods, dyes, or preservatives Pregnant or trying to get pregnant Breast-feeding How should I use this medication? This medication is injected into a vein. It is  given by your care team in a hospital or clinic setting. Talk to your care team about the use of this medication in children. Special care may be needed. Overdosage: If you think you have taken too much of this medicine contact a poison control center or emergency room at once. NOTE: This medicine is only for you. Do not share this medicine with others. What if I miss a dose? Keep appointments for follow-up doses. It is important not to miss your dose. Call your care team if you are unable to keep an appointment. What may interact with this medication? Amphotericin B Amiodarone Azathioprine Certain antivirals for HIV or hepatitis Certain medications for blood pressure, such as enalapril, lisinopril , quinapril Cyclosporine Diuretics Etanercept Indomethacin Medications that relax muscles Metronidazole  Natalizumab Tamoxifen Warfarin This list may not describe all possible interactions. Give your health care provider a list of all the medicines, herbs, non-prescription drugs, or dietary supplements you use. Also tell them if you smoke, drink alcohol, or use illegal drugs. Some items may interact with your medicine. What should I watch for while using this medication? This medication may make you feel generally unwell. This is not uncommon as chemotherapy can affect healthy cells as well as cancer cells. Report any side effects. Continue your course of treatment even though you feel ill unless your care team tells you to stop. You may need blood work while you are taking this medication. This medication may increase your risk of getting an infection. Call your care team for advice if you get a fever, chills, sore throat, or other symptoms of a cold or flu. Do not treat yourself. Try to avoid being around people who are sick. Avoid taking medications that contain aspirin, acetaminophen , ibuprofen , naproxen, or ketoprofen unless instructed by your care team. These medications may hide a fever. Be  careful brushing or flossing your teeth or using a toothpick because you may get an infection or bleed more easily. If you have any dental work done, tell your dentist you are receiving this medication. Drink water or other fluids as directed. Urinate often, even at night. Some products may contain alcohol. Ask your care team if this medication contains alcohol. Be sure to tell all care teams you are taking this medicine. Certain medicines, like metronidazole  and disulfiram, can cause an unpleasant reaction when taken with alcohol. The reaction includes flushing, headache, nausea, vomiting, sweating, and increased thirst. The reaction can last from 30 minutes to several hours. Talk to your care team if you wish to become pregnant or think you might be pregnant. This medication can cause serious birth defects if taken during pregnancy and for 1 year after the last dose. A negative pregnancy test is required before starting this medication. A reliable form of contraception is recommended while taking this medication and for 1 year after the last dose. Talk to your care team about reliable forms of contraception. Do not father a child while taking this medication and for 4 months after the last dose. Use a condom during this time period. Do not breast-feed while taking this medication or for 1 week after the last dose. This medication may cause infertility. Talk to your care team if you are concerned about your fertility.  Talk to your care team about your risk of cancer. You may be more at risk for certain types of cancer if you take this medication. What side effects may I notice from receiving this medication? Side effects that you should report to your care team as soon as possible: Allergic reactions--skin rash, itching, hives, swelling of the face, lips, tongue, or throat Dry cough, shortness of breath or trouble breathing Heart failure--shortness of breath, swelling of the ankles, feet, or hands,  sudden weight gain, unusual weakness or fatigue Heart muscle inflammation--unusual weakness or fatigue, shortness of breath, chest pain, fast or irregular heartbeat, dizziness, swelling of the ankles, feet, or hands Heart rhythm changes--fast or irregular heartbeat, dizziness, feeling faint or lightheaded, chest pain, trouble breathing Infection--fever, chills, cough, sore throat, wounds that don't heal, pain or trouble when passing urine, general feeling of discomfort or being unwell Kidney injury--decrease in the amount of urine, swelling of the ankles, hands, or feet Liver injury--right upper belly pain, loss of appetite, nausea, light-colored stool, dark yellow or brown urine, yellowing skin or eyes, unusual weakness or fatigue Low red blood cell level--unusual weakness or fatigue, dizziness, headache, trouble breathing Low sodium level--muscle weakness, fatigue, dizziness, headache, confusion Red or dark brown urine Unusual bruising or bleeding Side effects that usually do not require medical attention (report to your care team if they continue or are bothersome): Hair loss Irregular menstrual cycles or spotting Loss of appetite Nausea Pain, redness, or swelling with sores inside the mouth or throat Vomiting This list may not describe all possible side effects. Call your doctor for medical advice about side effects. You may report side effects to FDA at 1-800-FDA-1088. Where should I keep my medication? This medication is given in a hospital or clinic. It will not be stored at home. NOTE: This sheet is a summary. It may not cover all possible information. If you have questions about this medicine, talk to your doctor, pharmacist, or health care provider.  2024 Elsevier/Gold Standard (2021-10-08 00:00:00)

## 2023-07-13 NOTE — Progress Notes (Signed)
 Hypersensitivity Reaction note  Date of event: 07/13/23 Time of event: 1416 Generic name of drug involved: Docetaxel  Name of provider notified of the hypersensitivity reaction: Arley Hof, MD and Avinash Pasam, MD Was agent that likely caused hypersensitivity reaction added to Allergies List within EMR? yes Chain of events including reaction signs/symptoms, treatment administered, and outcome (e.g., drug resumed; drug discontinued; sent to Emergency Department; etc.) Patient was 8 minutes into her first time, first titration of Docetaxel  receiving approximately 13.9 of 33 mls when she started to feel a warm sensation in her mid chest and sudden lower back pain 10/10. Infusion was paused, IVF's were started, vital signs obtained ( see chart) and Arley Hof, MD notified. Dr Autumn who was called via telephone gave verbal orders to give 25 mg IV  Benadryl , 20 mg IV- Pepcid  and 125 IV solumedrol. Heat placed on patients back Dr Hof and Olam DASEN, NP at bedside to assess patient. After discussion with Dr Autumn via telephone,  it was decided to hold re-challenging patient with Docetaxel  today to give patient her Cytoxan  today and reschedule Docetaxel  for tomorrow with additional premedications an to give over 3 hours. Patient proceeded with Cytoxan  infusion without incident and discharged home with family.  Andree LITTIE Ned, RN 07/13/2023 3:25 PM

## 2023-07-14 ENCOUNTER — Inpatient Hospital Stay: Payer: Medicaid Other

## 2023-07-14 ENCOUNTER — Other Ambulatory Visit: Payer: Self-pay | Admitting: Oncology

## 2023-07-14 VITALS — BP 157/76 | HR 78 | Temp 98.0°F | Resp 18

## 2023-07-14 DIAGNOSIS — Z5111 Encounter for antineoplastic chemotherapy: Secondary | ICD-10-CM | POA: Diagnosis not present

## 2023-07-14 DIAGNOSIS — C50911 Malignant neoplasm of unspecified site of right female breast: Secondary | ICD-10-CM

## 2023-07-14 MED ORDER — METHYLPREDNISOLONE SODIUM SUCC 125 MG IJ SOLR
125.0000 mg | Freq: Once | INTRAMUSCULAR | Status: AC
  Administered 2023-07-14: 125 mg via INTRAVENOUS
  Filled 2023-07-14: qty 2

## 2023-07-14 MED ORDER — ACETAMINOPHEN 325 MG PO TABS
650.0000 mg | ORAL_TABLET | Freq: Once | ORAL | Status: AC
Start: 2023-07-14 — End: 2023-07-14
  Administered 2023-07-14: 650 mg via ORAL
  Filled 2023-07-14: qty 2

## 2023-07-14 MED ORDER — SODIUM CHLORIDE 0.9 % IV SOLN: Freq: Once | INTRAVENOUS | Status: DC | PRN

## 2023-07-14 MED ORDER — SODIUM CHLORIDE 0.9 % IV SOLN
75.0000 mg/m2 | Freq: Once | INTRAVENOUS | Status: DC
  Administered 2023-07-14: 196 mg via INTRAVENOUS
  Filled 2023-07-14: qty 19.6

## 2023-07-14 MED ORDER — FAMOTIDINE IN NACL 20-0.9 MG/50ML-% IV SOLN
20.0000 mg | Freq: Once | INTRAVENOUS | Status: AC
  Administered 2023-07-14: 20 mg via INTRAVENOUS
  Filled 2023-07-14: qty 50

## 2023-07-14 MED ORDER — DIPHENHYDRAMINE HCL 50 MG/ML IJ SOLN
25.0000 mg | Freq: Once | INTRAMUSCULAR | Status: AC
  Administered 2023-07-14: 25 mg via INTRAVENOUS
  Filled 2023-07-14: qty 1

## 2023-07-14 MED ORDER — SODIUM CHLORIDE 0.9 % IV SOLN
75.0000 mg/m2 | Freq: Once | INTRAVENOUS | Status: DC
  Filled 2023-07-14: qty 19.6

## 2023-07-14 MED ORDER — SODIUM CHLORIDE 0.9 % IV SOLN: INTRAVENOUS | Status: DC

## 2023-07-14 NOTE — Patient Instructions (Signed)
 [facility] - A DEPT OF [facility]   Discharge Instructions: Thank you for choosing [facility] to provide your oncology and hematology care.   If you have a lab appointment with the Cancer Center, please go directly to the Cancer Center and check in at the registration area.   Wear comfortable clothing and clothing appropriate for easy access to any Portacath or PICC line.   We strive to give you quality time with your provider. You may need to reschedule your appointment if you arrive late (15 or more minutes).  Arriving late affects you and other patients whose appointments are after yours.  Also, if you miss three or more appointments without notifying the office, you may be dismissed from the clinic at the provider's discretion.      For prescription refill requests, have your pharmacy contact our office and allow 72 hours for refills to be completed.    Today you received the following chemotherapy and/or immunotherapy agents Docetalex      To help prevent nausea and vomiting after your treatment, we encourage you to take your nausea medication as directed.  BELOW ARE SYMPTOMS THAT SHOULD BE REPORTED IMMEDIATELY: *FEVER GREATER THAN 100.4 F (38 C) OR HIGHER *CHILLS OR SWEATING *NAUSEA AND VOMITING THAT IS NOT CONTROLLED WITH YOUR NAUSEA MEDICATION *UNUSUAL SHORTNESS OF BREATH *UNUSUAL BRUISING OR BLEEDING *URINARY PROBLEMS (pain or burning when urinating, or frequent urination) *BOWEL PROBLEMS (unusual diarrhea, constipation, pain near the anus) TENDERNESS IN MOUTH AND THROAT WITH OR WITHOUT PRESENCE OF ULCERS (sore throat, sores in mouth, or a toothache) UNUSUAL RASH, SWELLING OR PAIN  UNUSUAL VAGINAL DISCHARGE OR ITCHING   Items with * indicate a potential emergency and should be followed up as soon as possible or go to the Emergency Department if any problems should occur.  Please show the CHEMOTHERAPY ALERT CARD or IMMUNOTHERAPY  ALERT CARD at check-in to the Emergency Department and triage nurse.  Should you have questions after your visit or need to cancel or reschedule your appointment, please contact [facility] - A DEPT OF [facility]  Dept: [phone]  and follow the prompts.  Office hours are 8:00 a.m. to 4:30 p.m. Monday - Friday. Please note that voicemails left after 4:00 p.m. may not be returned until the following business day.  We are closed weekends and major holidays. You have access to a nurse at all times for urgent questions. Please call the main number to the clinic Dept: [phone] and follow the prompts.   For any non-urgent questions, you may also contact your provider using MyChart. We now offer e-Visits for anyone 41 and older to request care online for non-urgent symptoms. For details visit [email or web address].   Also download the MyChart app! Go to the app store, search MyChart, open the app, select [place], and log in with your MyChart username and password.  Docetaxel  Injection What is this medication? DOCETAXEL  (doe se TAX el) treats some types of cancer. It works by slowing down the growth of cancer cells. This medicine may be used for other purposes; ask your health care provider or pharmacist if you have questions. COMMON BRAND NAME(S): BEIZRAY , Docefrez , Docivyx , Taxotere  What should I tell my care team before I take this medication? They need to know if you have any of these conditions: Kidney disease Liver disease Low white blood cell levels Tingling of the fingers or toes or other nerve disorder An  unusual or allergic reaction to docetaxel , polysorbate 80, other medications, foods, dyes, or preservatives Pregnant or trying to get pregnant Breast-feeding How should I use this medication? This medication is injected into a vein. It is given by your care team in a hospital or clinic setting. Talk to your care team about the use of this  medication in children. Special care may be needed. Overdosage: If you think you have taken too much of this medicine contact a poison control center or emergency room at once. NOTE: This medicine is only for you. Do not share this medicine with others. What if I miss a dose? Keep appointments for follow-up doses. It is important not to miss your dose. Call your care team if you are unable to keep an appointment. What may interact with this medication? Do not take this medication with any of the following: Live virus vaccines This medication may also interact with the following: Certain antibiotics, such as clarithromycin, telithromycin Certain antivirals for HIV or hepatitis Certain medications for fungal infections, such as itraconazole, ketoconazole, voriconazole Grapefruit juice Nefazodone Supplements, such as St. John's wort This list may not describe all possible interactions. Give your health care provider a list of all the medicines, herbs, non-prescription drugs, or dietary supplements you use. Also tell them if you smoke, drink alcohol, or use illegal drugs. Some items may interact with your medicine. What should I watch for while using this medication? This medication may make you feel generally unwell. This is not uncommon as chemotherapy can affect healthy cells as well as cancer cells. Report any side effects. Continue your course of treatment even though you feel ill unless your care team tells you to stop. You may need blood work done while you are taking this medication. This medication can cause serious side effects and infusion reactions. To reduce the risk, your care team may give you other medications to take before receiving this one. Be sure to follow the directions from your care team. This medication may increase your risk of getting an infection. Call your care team for advice if you get a fever, chills, sore throat, or other symptoms of a cold or flu. Do not treat  yourself. Try to avoid being around people who are sick. Avoid taking medications that contain aspirin, acetaminophen , ibuprofen , naproxen, or ketoprofen unless instructed by your care team. These medications may hide a fever. Be careful brushing or flossing your teeth or using a toothpick because you may get an infection or bleed more easily. If you have any dental work done, tell your dentist you are receiving this medication. Some products may contain alcohol. Ask your care team if this medication contains alcohol. Be sure to tell all care teams you are taking this medicine. Certain medications, like metronidazole  and disulfiram, can cause an unpleasant reaction when taken with alcohol. The reaction includes flushing, headache, nausea, vomiting, sweating, and increased thirst. The reaction can last from 30 minutes to several hours. This medication may affect your coordination, reaction time, or judgement. Do not drive or operate machinery until you know how this medication affects you. Sit up or stand slowly to reduce the risk of dizzy or fainting spells. Drinking alcohol with this medication can increase the risk of these side effects. Talk to your care team about your risk of cancer. You may be more at risk for certain types of cancer if you take this medication. Talk to your care team if you wish to become pregnant or think you might  be pregnant. This medication can cause serious birth defects if taken during pregnancy or if you get pregnant within 2 months after stopping therapy. A negative pregnancy test is required before starting this medication. A reliable form of contraception is recommended while taking this medication and for 2 months after stopping it. Talk to your care team about reliable forms of contraception. Do not breast-feed while taking this medication and for 1 week after stopping therapy. Use a condom during sex and for 4 months after stopping therapy. Tell your care team right away  if you think your partner might be pregnant. This medication can cause serious birth defects. This medication may cause infertility. Talk to your care team if you are concerned about your fertility. What side effects may I notice from receiving this medication? Side effects that you should report to your care team as soon as possible: Allergic reactions--skin rash, itching, hives, swelling of the face, lips, tongue, or throat Change in vision such as blurry vision, seeing halos around lights, vision loss Infection--fever, chills, cough, or sore throat Infusion reactions--chest pain, shortness of breath or trouble breathing, feeling faint or lightheaded Low red blood cell level--unusual weakness or fatigue, dizziness, headache, trouble breathing Pain, tingling, or numbness in the hands or feet Painful swelling, warmth, or redness of the skin, blisters or sores at the infusion site Redness, blistering, peeling, or loosening of the skin, including inside the mouth Sudden or severe stomach pain, bloody diarrhea, fever, nausea, vomiting Swelling of the ankles, hands, or feet Tumor lysis syndrome (TLS)--nausea, vomiting, diarrhea, decrease in the amount of urine, dark urine, unusual weakness or fatigue, confusion, muscle pain or cramps, fast or irregular heartbeat, joint pain Unusual bruising or bleeding Side effects that usually do not require medical attention (report to your care team if they continue or are bothersome): Change in nail shape, thickness, or color Change in taste Hair loss Increased tears This list may not describe all possible side effects. Call your doctor for medical advice about side effects. You may report side effects to FDA at 1-800-FDA-1088. Where should I keep my medication? This medication is given in a hospital or clinic. It will not be stored at home. NOTE: This sheet is a summary. It may not cover all possible information. If you have questions about this medicine,  talk to your doctor, pharmacist, or health care provider.  2024 Elsevier/Gold Standard (2021-07-29 00:00:00)

## 2023-07-14 NOTE — Progress Notes (Signed)
 Due to second reaction to Taxotere  rechallenge today. Verbal order received from Dr. Autumn: Give bolus Normal Saline per protocol and administer 650mg  Tylenol  for lower back pain. Discontinue Taxotere  infusion today. Will discuss new treatment plan./regimen with patient.

## 2023-07-14 NOTE — Progress Notes (Signed)
 DISCONTINUE ON PATHWAY REGIMEN - Breast     A cycle is every 21 days:     Cyclophosphamide       Docetaxel    **Always confirm dose/schedule in your pharmacy ordering system**  PRIOR TREATMENT: [id number]: TC - Docetaxel , Cyclophosphamide  q21 Days x 4 Cycles  START ON PATHWAY REGIMEN - Breast     Cycles 1 through 4: A cycle is every 14 days:     Doxorubicin       Cyclophosphamide       Pegfilgrastim -xxxx    Cycles 5 through 16: A cycle is every 7 days:     Nab-paclitaxel  (protein bound)   **Always confirm dose/schedule in your pharmacy ordering system**  Patient Characteristics: Preoperative or Nonsurgical Candidate, M0 (Clinical Staging), Up to cT4c, Any N, M0, Neoadjuvant Therapy followed by Surgery, Invasive Disease, Chemotherapy, HER2 Negative, ER Positive Therapeutic Status: Preoperative or Nonsurgical Candidate, M0 (Clinical Staging) AJCC M Category: cM0 AJCC Grade: G3 ER Status: Positive (+) AJCC 8 Stage Grouping: IIIC HER2 Status: Negative (-) AJCC T Category: cT4b AJCC N Category: cN2 PR Status: Negative (-) Breast Surgical Plan: Neoadjuvant Therapy followed by Surgery Intent of Therapy: Curative Intent, Discussed with Patient

## 2023-07-14 NOTE — Progress Notes (Signed)
 Hypersensitivity Reaction note  Date of event: 07/14/23 Time of event: 0954 Generic name of drug involved: TAXOTERE  Name of provider notified of the hypersensitivity reaction: DR.A. PASAM Was agent that likely caused hypersensitivity reaction added to Allergies List within EMR? YES Chain of events including reaction signs/symptoms, treatment administered, and outcome (e.g., drug resumed; drug discontinued; sent to Emergency Department; etc.) Patient was in her first 12 minute titration of re challenge of Taxotere  when she began to complain for mild lower back pain-infusion was stopped, NS started and vital signs obtained. Warm blanked placed on lower back. Pain progressively intensified from 3/10 to 10/10. Dr Autumn notified. Orders obtained  from Dr Autumn to continue IVF's and give Tylenol  650 mg.  Tylenol  given at 10:04. Patient recovered quickly with a pain level of 03/10 at 10:05. At 10:10 patient was pain free. Observation continued.  At 10:38 Dr Autumn was notified of patients status. At 10:50 Dr Pasam, okayed patient to be discharge with family. Patient told that she will be called by scheduling to setup future appointment with Dr Autumn.  Andree LITTIE Ned, RN 07/14/2023 3:05 PM

## 2023-07-15 ENCOUNTER — Ambulatory Visit: Payer: No Typology Code available for payment source

## 2023-07-17 ENCOUNTER — Telehealth: Payer: Self-pay | Admitting: Emergency Medicine

## 2023-07-17 NOTE — Telephone Encounter (Signed)
 Callback to patient after reaction to Taxotere  re challenge. Pt reports some nausea but otherwise no issues. Patient had not taken nausea medication as prescribed. Pt will take nausea medication if necessary. Patient had no other questions or concerns.Jasmine Novak

## 2023-07-17 NOTE — Progress Notes (Signed)
 24 Hour Callback  Patient in infusion for re-challenge of Taxotere . Patient has 1st dose of Cytoxan  yesterday and has had no sign of reaction or side effects.

## 2023-07-18 ENCOUNTER — Telehealth: Payer: Self-pay | Admitting: Oncology

## 2023-07-18 ENCOUNTER — Other Ambulatory Visit: Payer: No Typology Code available for payment source

## 2023-07-18 ENCOUNTER — Inpatient Hospital Stay (HOSPITAL_BASED_OUTPATIENT_CLINIC_OR_DEPARTMENT_OTHER): Payer: Medicaid Other | Admitting: Oncology

## 2023-07-18 ENCOUNTER — Encounter: Payer: Self-pay | Admitting: Oncology

## 2023-07-18 ENCOUNTER — Ambulatory Visit: Payer: No Typology Code available for payment source | Admitting: Oncology

## 2023-07-18 DIAGNOSIS — C50911 Malignant neoplasm of unspecified site of right female breast: Secondary | ICD-10-CM | POA: Diagnosis not present

## 2023-07-18 DIAGNOSIS — I1A0 Resistant hypertension: Secondary | ICD-10-CM

## 2023-07-18 DIAGNOSIS — G893 Neoplasm related pain (acute) (chronic): Secondary | ICD-10-CM | POA: Diagnosis not present

## 2023-07-18 DIAGNOSIS — Z452 Encounter for adjustment and management of vascular access device: Secondary | ICD-10-CM | POA: Diagnosis not present

## 2023-07-18 NOTE — Progress Notes (Signed)
 [facility]  HEMATOLOGY-ONCOLOGY ELECTRONIC VISIT PROGRESS NOTE  PATIENT NAME: Jasmine Novak   MR#: [id number] DOB: [birth year]  DATE OF SERVICE: 07/18/2023  Patient Care Team: Carolin Coy as PCP - General (Physician Assistant)  I connected with the patient via telephone conference and verified that I am speaking with the correct person using two identifiers. The patient's location is at home and I am providing care from the [facility].  I discussed the limitations, risks, security and privacy concerns of performing an evaluation and management service by e-visits and the availability of in person appointments.  I also discussed with the patient that there may be a patient responsible charge related to this service. The patient expressed understanding and agreed to proceed.   ASSESSMENT & PLAN:   Jasmine Novak is a 53 y.o. lady with a past medical history of hypertension, was referred to our clinic in December 2024 for newly diagnosed inflammatory carcinoma of right breast.  ER weakly positive, PR negative, HER2/neu negative.  Had severe infusion reaction to Taxol and mild reaction to Taxotere.  Inflammatory carcinoma of breast, right (HCC) Recently diagnosed with right breast mass (6 cm) and biopsy confirmed malignancy. Estrogen receptor weakly positive at 10%, progesterone receptor negative, and HER2 negative. Left breast has a small mass (6-8 mm) not concerning for malignancy. No lymph node involvement on biopsy but ultrasound was concerning for 3-4 right axillary lymph nodes.   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  -Previously I discussed staging, prognosis, plan of care, treatment options.  Reviewed NCCN guidelines.  On 06/13/2023, staging PET/CT showed intense metabolic activity within the right breast mass consistent with primary breast carcinoma.  Hypermetabolic right axillary and subpectoralis lymph  nodes consistent with local nodal metastasis.  No evidence of distant metastatic disease.    Given no evidence of metastatic disease, plan made to treat her with neoadjuvant chemoimmunotherapy using KEYNOTE-522 protocol (carboplatin and paclitaxel plus Keytruda for 4 cycles followed by Adriamycin, Cytoxan and Keytruda for 4 cycles followed by 9 more doses of adjuvant Keytruda).  Echocardiogram on 06/13/2023 showed preserved LV EF.  -She did have an appointment with Dr. Dwain Sarna for surgical consultation on 06/16/2023.    -She went [place] from [place]/21/2023 until 06/26/2023 and deferred treatment initiation until that point.   -She had Port-A-Cath placement on 07/03/2023.  Plan made to proceed with cycle 1, day 1 of carboplatin, paclitaxel and Keytruda on 07/04/2023. She received Keytruda first.  Later she started Taxol and few minutes into infusion, she had severe infusion reaction with chest pain, hypotension, diaphoresis, hypoxia.  EKG was unremarkable.  Blood pressure and O2 sats normalized after additional allergy medication and supplemental oxygen.  We will not rechallenge her with Taxol.  Plan made to switch treatments to Taxotere and cyclophosphamide regimen, to be given every 3 weeks for up to 6 cycles.  On 07/13/2023, she had infusion reaction to Taxotere as well, although not as severe as she had with Taxol.  She mainly had persistent lower back pain, increased blood pressure and some chest discomfort, which resolved quickly with pausing the infusion and Tylenol.  Plan to switch treatments to dose dense AC, followed by weekly Abraxane.  This was discussed with the patient over the phone today and she was agreeable.  She already had education for [facility] regimen previously.  Tentative start date on 07/27/2023.  I will see her on 07/25/2023 for  reevaluation and labs and plan for treatment on 07/27/2023.  Per patient preference, we will arrange for chemotherapy on Thursdays going  forward, to accommodate her work schedule.  Cancer associated pain Patient reports discomfort and tenderness in the area of the right breast mass. -Previously provided prescription for Tramadol for pain management as needed, with instructions for potential side effects (drowsiness, constipation) and the use of a stool softener if constipation occurs.  Resistant hypertension Patient has a history of hypertension and was previously misdiagnosed with congestive heart failure. Currently on Amlodipine once daily, Carvedilol twice daily, Spironolactone four times daily, and Furosemide once daily. -Continue current medications. -Emphasize the importance of good blood pressure control, especially in the context of planned chemotherapy.  Encounter for care related to Port-a-Cath She was found to have extensive bruising on the left breast and near left-sided Port-A-Cath on 07/11/2023.  She had fluoroscopic evaluation on 07/13/2023 and the Port-A-Cath reservoir was noted to be flipped.  She is scheduled to have Port-A-Cath revision on 07/20/2023.   I discussed the assessment and treatment plan with the patient. The patient was provided an opportunity to ask questions and all were answered. The patient agreed with the plan and demonstrated an understanding of the instructions. The patient was advised to call back or seek an in-person evaluation if the symptoms worsen or if the condition fails to improve as anticipated.    I spent 11 minutes over the phone with the patient reviewing test results, discuss management and coordination/planning of care.  Meryl Crutch, MD 07/18/2023 1:56 PM [facility] - A DEPT OF [facility] [address] [place] [address] Dept: [phone] Dept Fax: [phone]   INTERVAL HISTORY:  Please see above for problem oriented charting.  The purpose of today's discussion is to explain recent lab  results and to formulate plan of care.  SUMMARY OF ONCOLOGIC HISTORY:  ONCOLOGY HISTORY:   The patient has not been getting regular mammograms previously and has not seen a primary doctor recently.    She reports lump in her right breast that she noticed in mid-October 2024. The lump was causing discomfort, particularly when she is moving or wearing a bra. The patient reports that the area around the lump is tender and warm to the touch. She has been taking ibuprofen to manage the discomfort. She is right-handed and has been trying to avoid using her right arm due to the discomfort from the lump.    Since pain and swelling persisted, she presented to the ED on 05/07/2023.  A CT chest with contrast was obtained on 05/07/2023 which showed 5 x 4.2 cm mass within the right breast with diffuse cutaneous thickening overlying the right breast, highly suspicious for primary breast malignancy.  Right axillary lymphadenopathy measuring up to 13 mm, suspicious for metastatic disease.  A 3 mm subpleural right upper lobe lung nodule, indeterminate. Coronary artery calcifications and left atrial enlargement was also noted.   ED provider reached out to our clinic for further assistance and management.  We arranged for diagnostic mammogram, ultrasound and biopsy.   On 05/22/2023, diagnostic mammogram showed 5.3 cm highly suspicious mass in the right breast associated with adjacent inflammation and overlying skin thickening consistent with inflammatory breast carcinoma.  3-4 abnormal right axillary lymph nodes, suspicious for metastatic disease.  2 adjacent benign clusters of cysts in the left breast.  No evidence of left breast malignancy.  Ultrasound showed similar findings.  On 05/23/2023 she underwent core needle biopsy of right breast mass.  Pathology showed invasive ductal carcinoma, mitotic score 3, grade 3.  ER weakly positive at 10%.  PR negative, HER2/neu was 2+ by IHC, negative by FISH.  High Ki-67 index of  80%.   With these findings, she presented to clinic to establish care with [place] on 06/06/2023.  Request submitted for PET/CT and echocardiogram.    On 06/13/2023, staging PET/CT showed intense metabolic activity within the right breast mass consistent with primary breast carcinoma.  Hypermetabolic right axillary and subpectoralis lymph nodes consistent with local nodal metastasis.  No evidence of distant metastatic disease.     Given no evidence of metastatic disease, plan is to treat her with neoadjuvant chemoimmunotherapy using KEYNOTE-522 protocol (carboplatin and paclitaxel plus Keytruda for 4 cycles followed by Adriamycin, Cytoxan and Keytruda for 4 cycles followed by 9 more doses of adjuvant Keytruda).   Started systemic treatments from 07/04/2023.  Started cycle 1 of chemotherapy on 07/04/2023.  She received Keytruda first.  Later she started Taxol and few minutes into infusion, she had severe infusion reaction with chest pain, hypotension, diaphoresis, hypoxia.  EKG was unremarkable.  Blood pressure and O2 sats normalized after additional allergy medication and supplemental oxygen.   We will not rechallenge her with Taxol.   Plan made to switch treatments to Taxotere and cyclophosphamide regimen, to be given every 3 weeks for up to 6 cycles.  On 07/13/2023, she had infusion reaction to Taxotere as well, although not as severe as she had with Taxol.  She mainly had persistent lower back pain, increased blood pressure and some chest discomfort, which resolved quickly with pausing the infusion and Tylenol.  Plan to switch treatments to dose dense AC, followed by weekly Abraxane.  Tentative start date on 07/27/2023.  Oncology History  Inflammatory carcinoma of breast, right (HCC)  06/08/2023 Initial Diagnosis   Inflammatory carcinoma of breast, right (HCC)   06/15/2023 Cancer Staging   Staging form: Breast, AJCC 8th Edition - Clinical: Stage IIIC (cT4b, cN2, cM0, G3, ER+, PR-, HER2-) - Signed by  Meryl Crutch, MD on 06/15/2023 Histologic grading system: 3 grade system   07/04/2023 - 07/04/2023 Chemotherapy   Patient is on Treatment Plan : BREAST Pembrolizumab (200) D1 + Carboplatin (5) D1 + Paclitaxel (80) D1,8,15 q21d X 4 cycles / Pembrolizumab (200) D1 + AC D1 q21d x 4 cycles     07/13/2023 - 07/14/2023 Chemotherapy   Patient is on Treatment Plan : BREAST TC q21d     07/27/2023 -  Chemotherapy   Patient is on Treatment Plan : BREAST DOSE DENSE AC q14d / Nab-Paclitaxel (Abraxane) q7d       REVIEW OF SYSTEMS:    Review of Systems - Oncology  All other pertinent systems were reviewed with the patient and are negative.  I have reviewed the past medical history, past surgical history, social history and family history with the patient and they are unchanged from previous note.  ALLERGIES:  She is allergic to paclitaxel, docetaxel, lisinopril, and fosaprepitant.  MEDICATIONS:  Current Outpatient Medications  Medication Sig Dispense Refill   amLODipine (NORVASC) 10 MG tablet Take 1 tablet (10 mg total) by mouth daily. 90 tablet 0   carvedilol (COREG) 12.5 MG tablet Take 12.5 mg by mouth.     furosemide (LASIX) 40 MG tablet TAKE 1 TABLET(40 MG) BY MOUTH DAILY 30 tablet 2   ibuprofen (ADVIL) 200 MG tablet Take 200 mg by mouth every 6 (six)  hours as needed.     irbesartan (AVAPRO) 300 MG tablet Take by mouth.     spironolactone (ALDACTONE) 25 MG tablet Take by mouth.     traMADol (ULTRAM) 50 MG tablet Take 1 tablet (50 mg total) by mouth every 6 (six) hours as needed. 90 tablet 0   No current facility-administered medications for this visit.    PHYSICAL EXAMINATION:   Onc Performance Status - 07/18/23 1300       ECOG Perf Status   ECOG Perf Status Restricted in physically strenuous activity but ambulatory and able to carry out work of a light or sedentary nature, e.g., light house work, office work      KPS SCALE   KPS % SCORE Able to carry on normal activity, minor s/s of  disease             LABORATORY DATA:   I have reviewed the data as listed.  Recent Results (from the past 2160 hours)  CBC with Differential     Status: None   Collection Time: 05/07/23  5:01 PM  Result Value Ref Range   WBC 7.3 4.0 - 10.5 K/uL   RBC 4.59 3.87 - 5.11 MIL/uL   Hemoglobin 12.1 12.0 - 15.0 g/dL   HCT 40.9 81.1 - 91.4 %   MCV 84.1 80.0 - 100.0 fL   MCH 26.4 26.0 - 34.0 pg   MCHC 31.3 30.0 - 36.0 g/dL   RDW 78.2 95.6 - 21.3 %   Platelets 275 150 - 400 K/uL   nRBC 0.0 0.0 - 0.2 %   Neutrophils Relative % 58 %   Neutro Abs 4.2 1.7 - 7.7 K/uL   Lymphocytes Relative 27 %   Lymphs Abs 2.0 0.7 - 4.0 K/uL   Monocytes Relative 11 %   Monocytes Absolute 0.8 0.1 - 1.0 K/uL   Eosinophils Relative 3 %   Eosinophils Absolute 0.3 0.0 - 0.5 K/uL   Basophils Relative 1 %   Basophils Absolute 0.1 0.0 - 0.1 K/uL   Immature Granulocytes 0 %   Abs Immature Granulocytes 0.03 0.00 - 0.07 K/uL    Comment: Performed at [facility], [address], [place], [place] [address]  Comprehensive metabolic panel     Status: None   Collection Time: 05/07/23  5:01 PM  Result Value Ref Range   Sodium 136 135 - 145 mmol/L   Potassium 4.0 3.5 - 5.1 mmol/L   Chloride 103 98 - 111 mmol/L   CO2 27 22 - 32 mmol/L   Glucose, Bld 91 70 - 99 mg/dL    Comment: Glucose reference range applies only to samples taken after fasting for at least 8 hours.   BUN 13 6 - 20 mg/dL   Creatinine, Ser 8.46 0.44 - 1.00 mg/dL   Calcium 8.9 8.9 - 96.2 mg/dL   Total Protein 7.7 6.5 - 8.1 g/dL   Albumin 3.8 3.5 - 5.0 g/dL   AST 16 15 - 41 U/L   ALT 17 0 - 44 U/L   Alkaline Phosphatase 88 38 - 126 U/L   Total Bilirubin 0.3 <1.2 mg/dL   GFR, Estimated >95 >28 mL/min    Comment: (NOTE) Calculated using the CKD-EPI Creatinine Equation (2021)    Anion gap 6 5 - 15    Comment: Performed at [facility], [address] [place], [place] [address]  Surgical pathology      Status: None   Collection Time: 05/23/23 12:00 AM  Result Value  Ref Range   SURGICAL PATHOLOGY      SURGICAL PATHOLOGY [facility] [address] [place], [place] [address] Telephone [phone] or [phone] Fax [phone]  REPORT OF SURGICAL PATHOLOGY   Accession #: [id number] Patient Name: ANNISE, BORAN Visit # : [id number]  MRN: [id number] Physician: Romona Curls DOB/Age 04-Mar-1971 (Age: 35) Gender: F Collected Date: 05/23/2023 Received Date: 05/23/2023  FINAL DIAGNOSIS       1. Breast, right, needle core biopsy, 9:00, 9 cmfn, irregular mass (coil) :       INVASIVE DUCTAL CARCINOMA      TUBULE FORMATION: SCORE 3      NUCLEAR PLEOMORPHISM: SCORE 3      MITOTIC COUNT: SCORE 3      TOTAL SCORE: 9      OVERALL GRADE: 3      LYMPHOVASCULAR INVASION: NOT IDENTIFIED      CANCER LENGTH: 18 MM / 1.8 CM      CALCIFICATIONS: NOT IDENTIFIED      OTHER FINDINGS: NONE      SEE NOTE       2. Lymph node, needle/core biopsy, R axillary enlarged lymph node (open coil) :       ONE BENIGN LYMPH NODE, NEGATIVE FOR CARCINOMA (0/1) .       Diagnosis Note : Dr.Picklesimer reviewed the case and concurs with the      interpretation.A breast prognostic profile (ER and PR) is pending and will be      reported in an addendum.The [facility] was notified on      05/24/2023.      ELECTRONIC SIGNATURE : Lance Coon Md, Pathologist, Electronic Signature  MICROSCOPIC DESCRIPTION  CASE COMMENTS STAINS USED IN DIAGNOSIS: H&E-2 H&E-3 H&E-4 H&E *RECUT 1 SLIDE H&E Stains used in diagnosis 1 Her2 by IHC, 1 ER-ACIS, 1 KI-67-ACIS, 1 PR-ACIS, 1 Her2 FISH IHC scores are reported using ASCO/CAP scoring criteria.  An IHC Score of 0 or 1+  is NEGATIVE for HER2, 3+ is POSITIVE for HER2, and 2+ is EQUIVOCAL. Equivocal results are reflexed to either FISH or IHC testing. Specimens are fixed in 10% Neutral Buffered Formalin for at least 6  hours and up to 72 hours. These tests have not be validated on decalcified tissue.  Results should be interpreted with caution given the possibility of fa lse negative results on decalcified specimens. Antibody Clone for HER2 is 4B5 (PATHWAY). Some of these immunohistochemical stains may have been developed and the performance characteristics determined by [facility].  Some may not have been cleared or approved by the U.S. Food and Drug Administration.  The FDA has determined that such clearance or approval is not necessary.  This test is used for clinical purposes.  It should not be regarded as investigational or for research.  This laboratory is certified under the Clinical Laboratory Improvement Amendments of 1988 (CLIA-88) as qualified to perform high complexity clinical laboratory testing. Estrogen receptor (6F11), immunohistochemical stains are performed on formalin fixed, paraffin embedded tissue using a 3,3"-diaminobenzidine (DAB) chromogen and Leica Bond Autostainer System.  The staining intensity of the nucleus is scored manually and is reported as the percentage of tumor cell nuclei demonstrating sp ecific nuclear staining.Specimens are fixed in 10% Neutral Buffered Formalin for at least 6 hours and up to 72 hours.  These tests have not be validated on decalcified tissue.  Results should be interpreted with caution given the possibility of false negative results on  decalcified specimens. Ki-67 (MM1), immunohistochemical stains are performed on formalin fixed, paraffin embedded tissue using a 3,3"-diaminobenzidine (DAB) chromogen and Leica Bond Autostainer System.  The staining intensity of the nucleus is scored manually and is reported as the percentage of tumor cell nuclei demonstrating specific nuclear staining.Specimens are fixed in 10% Neutral Buffered Formalin for at least 6 hours and up to 72 hours. These tests have not be validated on decalcified tissue.   Results should be interpreted with caution given the possibility of false negative results on decalcified specimens. PR progesterone receptor (16), immunohistochemical stains are performed on formalin fixed, p araffin embedded tissue using a 3,3"-diaminobenzidine (DAB) chromogen and Leica Bond Autostainer System.  The staining intensity of the nucleus is scored manually and is reported as the percentage of tumor cell nuclei demonstrating specific nuclear staining.Specimens are fixed in 10% Neutral Buffered Formalin for at least 6 hours and up to 72 hours. These tests have not be validated on decalcified tissue.  Results should be interpreted with caution given the possibility of false negative results on decalcified specimens. HER2 IQFISH pharmDX (code [id number]) is a direct fluorescence in-situ hybridization assay designed to quantitatively determine HER2 gene amplification in formalin-fixed, paraffin-embedded tissue specimens. Results are reported using ASCO/CAP and manufacturer's scoring criteria.  GROUP 1: Ratio of HER2/CEN 17 >=2.0 and average HER2 >=4.0 signals/cell = POSITIVE.  GROUP 2:  Ratio of HER2 /CEN 17 >=2.0 and average HER2 < 4.0 signals/cell with an IHC of 2+ = Revie w of 20 additional cells.  If score is still >=2.0 and average HER2 < 4.0 = NEGATIVE. GROUP 3:  Ratio of Her2/CEN 17 < 2.0 and average HER2 >=6.0 signals/cell with an IHC of 2+ = Review of 20 additional cells.  If score is still < 2.0 and average HER2 >=6.0 signals/cell= POSITIVE.  GROUP 4:  Ratio of Her2 /CEN 17 < 2.0 and average HER2 >=4.0 and < 6.0 signals/cell with an IHC score of 2+ =Review of 20 additional cells.  If score is still < 2.0 and average HER2 >=4.0 and < 6.0 signals/cell= NEGATIVE.  GROUP 5:  Ratio of HER2/CEN 17 < 2.0 and average HER2 < 4.0 signals/cell = NEGATIVE  Specimens are fixed in 10% Neutral Buffered Formalin for at least 6 hours and up to 72 hours.  These tests have not been validated on  decalcified tissue.  Results should be interpreted with caution given the possibility of false negative results on decalcified specimens.  ADDENDUM Breast, right, needle core biopsy, 9:00, 9 cmfn, irregular mass (coil) PROGNOSTIC INDICATORS  Results: IMMUNO HISTOCHEMICAL AND MORPHOMETRIC ANALYSIS PERFORMED MANUALLY The tumor cells are equivocal for Her2 (2+). Her2 by FISH will be performed and the results reported separately. Estrogen Receptor:  10%, LOW_POSITIVE, WEAK STAINING INTENSITY Progesterone Receptor:  0%, NEGATIVE Proliferation Marker Ki67:  80% COMMENT:  The negative hormone receptor study(ies) in this case has an internal positive control.  REFERENCE RANGE ESTROGEN RECEPTOR NEGATIVE     0% POSITIVE       =>1% REFERENCE RANGE PROGESTERONE RECEPTOR NEGATIVE     0% POSITIVE        =>1% All controls stained appropriately Arville Care, [place], Sports administrator, International aid/development worker ( Signed [phone]) 1A-Breast,right,needle core biopsy FLUORESCENCE IN-SITU HYBRIDIZATION  Results: GROUP 5:   HER2 **NEGATIVE**  Equivocal form of amplification of the HER2 gene was detected in the IHC 2+ tissue sample received from this individual.  HER2 FISH was performed by a technologist and cell imaging and analysis on  the BioView. R ATIO OF HER2/CEN17 SIGNALS                          1.39 AVERAGE HER2 COPY NUMBER PER CELL      2.30 The ratio of HER2/CEN 17 is within the range < 2.0 of HER2/CEN 17 and a copy number of HER2 signals per cell is <4.0. Arch Pathol Lab Med 1:1,2018 Arville Care, [place], Sports administrator, International aid/development worker ( Signed [phone])   CLINICAL HISTORY  SPECIMEN(S) OBTAINED 1. Breast, right, needle core biopsy, 9:00, 9 Cmfn, Irregular Mass (coil) 2. Lymph node, needle/core biopsy, R Axillary Enlarged Lymph Node (open Coil)  SPECIMEN COMMENTS: 1. In formalin: 1:44, CIT < 5 min, palpable mass, suspect malignancy and metastatic disease 2. In formalin: 1:53, CIT < 5  min SPECIMEN CLINICAL INFORMATION:    Gross Description 1. Received in formalin labeled with the patient's name and right breast 9 o'clock, 9 cm from nipple, and consists of three cores of tan pink fibroadipose tissue, ranging from 1.7 x 0.2 x 0.2 cm to 2.0 x 0.2 x 0.2 cm.The specimen is entirely submitted in one cass ette.      TIF 1:44 p.m. on 05/23/23 CIT less than 5 mins. 2. Received in formalin labeled with the patient's name and right axilla node, and consists of two cores of tan pink soft tissue each measuring 2.1 x 0.2 x 0.2 cm  The specimen is entirely submitted in one cassette.      TIF 1:53 p.m. on 05/23/23.  CIT less than 5 mins.  (KL:gt, 05/24/23)        Report signed out from the following location(s) [place]. [facility] [address], [place] [address] CLIA #: [id number]  [facility] [address] [place], [place] [address] CLIA #: [id number]   Hemoglobin A1c     Status: Abnormal   Collection Time: 06/06/23 10:38 AM  Result Value Ref Range   Hgb A1c MFr Bld 6.5 (H) 4.8 - 5.6 %    Comment: (NOTE)         Prediabetes: 5.7 - 6.4         Diabetes: >6.4         Glycemic control for adults with diabetes: <7.0    Mean Plasma Glucose 140 mg/dL    Comment: (NOTE) Performed At: [facility] [address] [place], [place] [id number] Jolene Schimke MD IH:[id number]   CEA (Access)-[facility] ONLY     Status: None   Collection Time: 06/06/23 10:38 AM  Result Value Ref Range   CEA ([facility]) 1.12 0.00 - 5.00 ng/mL    Comment: (NOTE) This test was performed using Beckman Coulter's paramagnetic chemiluminescent immunoassay. Values obtained from different assay methods cannot be used interchangeably. Please note that up to 8% of patients who smoke may see values 5.1-10.0 ng/ml and 1% of patients who smoke may see CEA levels >10.0 ng/ml. Performed at [facility], [address] [place], [place] [address]    Cancer antigen 15-3     Status: None   Collection Time: 06/06/23 10:38 AM  Result Value Ref Range   CA 15-3 21.3 0.0 - 25.0 U/mL    Comment: (NOTE) Roche Diagnostics Electrochemiluminescence Immunoassay (ECLIA) Values obtained with different assay methods or kits cannot be used interchangeably.  Results cannot be interpreted as absolute evidence of the presence or absence of malignant disease. Performed At: [facility] [address]  [place], [place] [id number] Jolene Schimke MD WU:[id number]   CA 27.29     Status: None   Collection Time: 06/06/23 10:38 AM  Result Value Ref Range   CA 27.29 21.0 0.0 - 38.6 U/mL    Comment: (NOTE) Siemens Centaur Immunochemiluminometric Methodology [facility]) Values obtained with different assay methods or kits cannot be used interchangeably. Results cannot be interpreted as absolute evidence of the presence or absence of malignant disease. Performed At: [facility] [address] [place], [place] [id number] Jolene Schimke MD VH:[id number]   Lactate dehydrogenase     Status: Abnormal   Collection Time: 06/06/23 10:38 AM  Result Value Ref Range   LDH 312 (H) 98 - 192 U/L    Comment: Performed at [facility], [address], [place], [place] [address]  Comprehensive metabolic panel     Status: Abnormal   Collection Time: 06/06/23 10:38 AM  Result Value Ref Range   Sodium 137 135 - 145 mmol/L   Potassium 3.9 3.5 - 5.1 mmol/L   Chloride 105 98 - 111 mmol/L   CO2 25 22 - 32 mmol/L   Glucose, Bld 104 (H) 70 - 99 mg/dL    Comment: Glucose reference range applies only to samples taken after fasting for at least 8 hours.   BUN 13 6 - 20 mg/dL   Creatinine, Ser 4.01 0.44 - 1.00 mg/dL   Calcium 8.9 8.9 - 02.7 mg/dL   Total Protein 8.1 6.5 - 8.1 g/dL   Albumin 4.1 3.5 - 5.0 g/dL   AST 18 15 - 41 U/L   ALT 18 0 - 44 U/L   Alkaline Phosphatase 103 38 - 126 U/L   Total Bilirubin 0.5 0.0 - 1.2 mg/dL   GFR,  Estimated >25 >36 mL/min    Comment: (NOTE) Calculated using the CKD-EPI Creatinine Equation (2021)    Anion gap 7 5 - 15    Comment: Performed at [facility], [address] [place], [place] [address]  CBC with Differential/Platelet     Status: None   Collection Time: 06/06/23 10:38 AM  Result Value Ref Range   WBC 8.0 4.0 - 10.5 K/uL   RBC 4.62 3.87 - 5.11 MIL/uL   Hemoglobin 12.3 12.0 - 15.0 g/dL   HCT 47.4 25.9 - 56.3 %   MCV 82.7 80.0 - 100.0 fL   MCH 26.6 26.0 - 34.0 pg   MCHC 32.2 30.0 - 36.0 g/dL   RDW 87.5 64.3 - 32.9 %   Platelets 266 150 - 400 K/uL   nRBC 0.0 0.0 - 0.2 %   Neutrophils Relative % 70 %   Neutro Abs 5.5 1.7 - 7.7 K/uL   Lymphocytes Relative 19 %   Lymphs Abs 1.6 0.7 - 4.0 K/uL   Monocytes Relative 9 %   Monocytes Absolute 0.7 0.1 - 1.0 K/uL   Eosinophils Relative 2 %   Eosinophils Absolute 0.2 0.0 - 0.5 K/uL   Basophils Relative 0 %   Basophils Absolute 0.0 0.0 - 0.1 K/uL   Immature Granulocytes 0 %   Abs Immature Granulocytes 0.02 0.00 - 0.07 K/uL    Comment: Performed at [facility], [address], [place], [place] [address]  ECHOCARDIOGRAM COMPLETE     Status: None   Collection Time: 06/13/23  2:35 PM  Result Value Ref Range   S' Lateral 3.20 cm   Area-P 1/2 3.54 cm2   Est EF 55 - 60%   Glucose, capillary  Status: None   Collection Time: 06/13/23  3:03 PM  Result Value Ref Range   Glucose-Capillary 78 70 - 99 mg/dL    Comment: Glucose reference range applies only to samples taken after fasting for at least 8 hours.  TSH     Status: None   Collection Time: 07/04/23  8:35 AM  Result Value Ref Range   TSH 1.430 0.350 - 4.500 uIU/mL    Comment: Performed by a 3rd Generation assay with a functional sensitivity of <=0.01 uIU/mL. Performed at [facility], [address], [place], [place] [address]   T4     Status: None   Collection Time: 07/04/23  8:35 AM  Result Value Ref  Range   T4, Total 7.6 4.5 - 12.0 ug/dL    Comment: (NOTE) Performed At: [facility] [address] [place], [place] [id number] Jolene Schimke MD NF:[id number]   CMP (Cancer Center only)     Status: Abnormal   Collection Time: 07/04/23  8:35 AM  Result Value Ref Range   Sodium 137 135 - 145 mmol/L   Potassium 3.6 3.5 - 5.1 mmol/L   Chloride 103 98 - 111 mmol/L   CO2 28 22 - 32 mmol/L   Glucose, Bld 148 (H) 70 - 99 mg/dL    Comment: Glucose reference range applies only to samples taken after fasting for at least 8 hours.   BUN 15 6 - 20 mg/dL   Creatinine 4.69 6.29 - 1.00 mg/dL   Calcium 8.9 8.9 - 52.8 mg/dL   Total Protein 7.9 6.5 - 8.1 g/dL   Albumin 3.6 3.5 - 5.0 g/dL   AST 14 (L) 15 - 41 U/L   ALT 16 0 - 44 U/L   Alkaline Phosphatase 94 38 - 126 U/L   Total Bilirubin 0.4 0.0 - 1.2 mg/dL   GFR, Estimated >41 >32 mL/min    Comment: (NOTE) Calculated using the CKD-EPI Creatinine Equation (2021)    Anion gap 6 5 - 15    Comment: Performed at [facility], [address] [place], [place] [address]  CBC with Differential (Cancer Center Only)     Status: Abnormal   Collection Time: 07/04/23  8:35 AM  Result Value Ref Range   WBC Count 7.0 4.0 - 10.5 K/uL   RBC 4.30 3.87 - 5.11 MIL/uL   Hemoglobin 11.6 (L) 12.0 - 15.0 g/dL   HCT 27.2 53.6 - 64.4 %   MCV 84.4 80.0 - 100.0 fL   MCH 27.0 26.0 - 34.0 pg   MCHC 32.0 30.0 - 36.0 g/dL   RDW 03.4 74.2 - 59.5 %   Platelet Count 249 150 - 400 K/uL   nRBC 0.0 0.0 - 0.2 %   Neutrophils Relative % 67 %   Neutro Abs 4.7 1.7 - 7.7 K/uL   Lymphocytes Relative 21 %   Lymphs Abs 1.5 0.7 - 4.0 K/uL   Monocytes Relative 10 %   Monocytes Absolute 0.7 0.1 - 1.0 K/uL   Eosinophils Relative 2 %   Eosinophils Absolute 0.1 0.0 - 0.5 K/uL   Basophils Relative 0 %   Basophils Absolute 0.0 0.0 - 0.1 K/uL   Immature Granulocytes 0 %   Abs Immature Granulocytes 0.02 0.00 - 0.07 K/uL    Comment: Performed at [facility], [address] [place], [place] [address]  CMP (Cancer Center only)     Status: Abnormal   Collection Time: 07/11/23 11:01 AM  Result Value Ref Range  Sodium 135 135 - 145 mmol/L   Potassium 4.0 3.5 - 5.1 mmol/L   Chloride 102 98 - 111 mmol/L   CO2 27 22 - 32 mmol/L   Glucose, Bld 101 (H) 70 - 99 mg/dL    Comment: Glucose reference range applies only to samples taken after fasting for at least 8 hours.   BUN 11 6 - 20 mg/dL   Creatinine 1.91 4.78 - 1.00 mg/dL   Calcium 8.8 (L) 8.9 - 10.3 mg/dL   Total Protein 7.8 6.5 - 8.1 g/dL   Albumin 3.7 3.5 - 5.0 g/dL   AST 13 (L) 15 - 41 U/L   ALT 16 0 - 44 U/L   Alkaline Phosphatase 87 38 - 126 U/L   Total Bilirubin 0.5 0.0 - 1.2 mg/dL   GFR, Estimated >29 >56 mL/min    Comment: (NOTE) Calculated using the CKD-EPI Creatinine Equation (2021)    Anion gap 6 5 - 15    Comment: Performed at [facility], [address], [place], [place] [address]  CBC with Differential (Cancer Center Only)     Status: Abnormal   Collection Time: 07/11/23 11:01 AM  Result Value Ref Range   WBC Count 7.6 4.0 - 10.5 K/uL   RBC 4.43 3.87 - 5.11 MIL/uL   Hemoglobin 11.9 (L) 12.0 - 15.0 g/dL   HCT 65.7 84.6 - 96.2 %   MCV 83.7 80.0 - 100.0 fL   MCH 26.9 26.0 - 34.0 pg   MCHC 32.1 30.0 - 36.0 g/dL   RDW 95.2 84.1 - 32.4 %   Platelet Count 211 150 - 400 K/uL   nRBC 0.0 0.0 - 0.2 %   Neutrophils Relative % 66 %   Neutro Abs 5.0 1.7 - 7.7 K/uL   Lymphocytes Relative 22 %   Lymphs Abs 1.7 0.7 - 4.0 K/uL   Monocytes Relative 9 %   Monocytes Absolute 0.7 0.1 - 1.0 K/uL   Eosinophils Relative 2 %   Eosinophils Absolute 0.2 0.0 - 0.5 K/uL   Basophils Relative 0 %   Basophils Absolute 0.0 0.0 - 0.1 K/uL   Immature Granulocytes 1 %   Abs Immature Granulocytes 0.07 0.00 - 0.07 K/uL    Comment: Performed at [facility], [address], [place], [place] [address]     RADIOGRAPHIC STUDIES:  I  have personally reviewed the radiological images as listed and agree with the findings in the report.  IR CV Line Injection Result Date: 07/13/2023 INDICATION: 53 year old female with history of malfunctioning Port-A-Cath. EXAM: FLUOROSCOPIC GUIDED PORT A CATHETER CHECK MEDICATIONS: None. CONTRAST:  5 mL Omnipaque 300 FLUOROSCOPY TIME:  Six mGy COMPLICATIONS: None immediate. TECHNIQUE: The procedure, risks, benefits, and alternatives were explained to the patient and informed written consent was obtained. A timeout was performed prior to the initiation of the procedure. The patient's chest port a catheter was accessed by the IR RN. The patient was placed supine on the fluoroscopy table. A preprocedural spot fluoroscopic image was obtained of the chest in existing port a catheter. Note was made of difficulty aspirating blood from the port a catheter. Contrast was injected via the Port a catheter and images were reviewed. The needle was removed the patient tolerated the procedure well without immediate postprocedural complication. FINDINGS: Flipped Port-A-Cath. IMPRESSION: The Port-A-Cath reservoir is flipped. PLAN: Interventional Radiology has arrange for Port-A-Cath revision. Recommend larger reservoir to minimize chances of additional flipping in the future. Marliss Coots, MD [facility]  [facility] Electronically Signed   By: Marliss Coots M.D.   On: 07/13/2023 10:50   IR IMAGING GUIDED PORT INSERTION Result Date: 07/04/2023 CLINICAL DATA:  Right breast carcinoma and need for porta cath to begin chemotherapy. EXAM: IMPLANTED PORT A CATH PLACEMENT WITH ULTRASOUND AND FLUOROSCOPIC GUIDANCE ANESTHESIA/SEDATION: Moderate (conscious) sedation was employed during this procedure. A total of Versed 2.0 mg and Fentanyl 100 mcg was administered intravenously. Moderate Sedation Time: 43 minutes. The patient's level of consciousness and vital signs were monitored continuously  by radiology nursing throughout the procedure under my direct supervision. FLUOROSCOPY: 1 minute and 24 seconds.  25.0 mGy. PROCEDURE: The procedure, risks, benefits, and alternatives were explained to the patient. Questions regarding the procedure were encouraged and answered. The patient understands and consents to the procedure. A time-out was performed prior to initiating the procedure. Ultrasound was utilized to confirm patency of the left internal jugular vein. An ultrasound image was saved and recorded. The left neck and chest were prepped with chlorhexidine in a sterile fashion, and a sterile drape was applied covering the operative field. Maximum barrier sterile technique with sterile gowns and gloves were used for the procedure. Local anesthesia was provided with 1% lidocaine. After creating a small venotomy incision, a 21 gauge needle was advanced into the left internal jugular vein under direct, real-time ultrasound guidance. Ultrasound image documentation was performed. After securing guidewire access, an 8 Fr dilator was placed. A J-wire was kinked to measure appropriate catheter length. A subcutaneous port pocket was then created along the upper chest wall utilizing sharp and blunt dissection. Portable cautery was utilized. The pocket was irrigated with sterile saline. A single lumen power injectable port was chosen for placement. The 8 Fr catheter was tunneled from the port pocket site to the venotomy incision. The port was placed in the pocket. External catheter was trimmed to appropriate length based on guidewire measurement. At the venotomy, an 8 Fr peel-away sheath was placed over a guidewire. The catheter was then placed through the sheath and the sheath removed. Final catheter positioning was confirmed and documented with a fluoroscopic spot image. The port was accessed with a needle and aspirated and flushed with heparinized saline. The access needle was removed. The venotomy and port pocket  incisions were closed with subcutaneous 3-0 Monocryl and subcuticular 4-0 Vicryl. Dermabond was applied to both incisions. COMPLICATIONS: COMPLICATIONS None FINDINGS: After catheter placement, the tip lies at the cavo-atrial junction. The catheter aspirates normally and is ready for immediate use. IMPRESSION: Placement of single lumen port a cath via left internal jugular vein. The catheter tip lies at the cavo-atrial junction. A power injectable port a cath was placed and is ready for immediate use. Electronically Signed   By: Irish Lack M.D.   On: 07/04/2023 10:51     Orders Placed This Encounter  Procedures   CBC with Differential (Cancer Center Only)    Standing Status:   Future    Expected Date:   07/27/2023    Expiration Date:   07/26/2024   CMP (Cancer Center only)    Standing Status:   Future    Expected Date:   07/27/2023    Expiration Date:   07/26/2024   CBC with Differential (Cancer Center Only)    Standing Status:   Future    Expected Date:   08/10/2023    Expiration Date:   08/09/2024   CMP (Cancer Center only)    Standing Status:   Future  Expected Date:   08/10/2023    Expiration Date:   08/09/2024     Future Appointments  Date Time Provider Department Center  07/20/2023 12:00 PM [facility] 1 [facility]  07/24/2023 10:00 AM [facility] CHEMO EDU [facility] None  07/25/2023 11:00 AM [facility] PHLEBOTOMIST [facility] None  07/25/2023 11:15 AM [facility] INFUSION [facility] None  07/25/2023 11:40 AM Burnett Lieber, MD [facility] None  07/27/2023  9:30 AM [facility] INFUSION [facility] None  07/29/2023 12:00 PM [facility] FLUSH [facility] None  08/01/2023 11:15 AM [facility] INFUSION [facility] None  08/10/2023  7:45 AM [facility] PHLEBOTOMIST [facility] None  08/10/2023  8:00 AM [facility] INFUSION [facility] None  08/10/2023  8:30 AM Pollyann Samples, NP [facility] None  08/10/2023  9:30 AM [facility] INFUSION [facility] None  08/12/2023 12:00 PM [facility] FLUSH [facility] None  08/22/2023  9:30 AM [facility]  INFUSION [facility] None  09/21/2023 11:00 AM Koerner, Cari M [facility] None  09/21/2023 12:00 PM [facility] LAB [facility] None    This document was completed utilizing speech recognition software. Grammatical errors, random word insertions, pronoun errors, and incomplete sentences are an occasional consequence of this system due to software limitations, ambient noise, and hardware issues. Any formal questions or concerns about the content, text or information contained within the body of this dictation should be directly addressed to the provider for clarification.

## 2023-07-18 NOTE — Assessment & Plan Note (Signed)
Patient reports discomfort and tenderness in the area of the right breast mass. -Previously provided prescription for Tramadol for pain management as needed, with instructions for potential side effects (drowsiness, constipation) and the use of a stool softener if constipation occurs.

## 2023-07-18 NOTE — Telephone Encounter (Signed)
Left patient a vm regarding upcoming appointment

## 2023-07-18 NOTE — Assessment & Plan Note (Addendum)
Recently diagnosed with right breast mass (6 cm) and biopsy confirmed malignancy. Estrogen receptor weakly positive at 10%, progesterone receptor negative, and HER2 negative. Left breast has a small mass (6-8 mm) not concerning for malignancy. No lymph node involvement on biopsy but ultrasound was concerning for 3-4 right axillary lymph nodes.   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  -Previously I discussed staging, prognosis, plan of care, treatment options.  Reviewed NCCN guidelines.  On 06/13/2023, staging PET/CT showed intense metabolic activity within the right breast mass consistent with primary breast carcinoma.  Hypermetabolic right axillary and subpectoralis lymph nodes consistent with local nodal metastasis.  No evidence of distant metastatic disease.    Given no evidence of metastatic disease, plan made to treat her with neoadjuvant chemoimmunotherapy using KEYNOTE-522 protocol (carboplatin and paclitaxel plus Keytruda for 4 cycles followed by Adriamycin, Cytoxan and Keytruda for 4 cycles followed by 9 more doses of adjuvant Keytruda).  Echocardiogram on 06/13/2023 showed preserved LV EF.  -She did have an appointment with Dr. Dwain Sarna for surgical consultation on 06/16/2023.    -She went [place] a [place] to [place] from 06/21/2023 until 06/26/2023 and deferred treatment initiation until that point.   -She had Port-A-Cath placement on 07/03/2023.  Plan made to proceed with cycle 1, day 1 of carboplatin, paclitaxel and Keytruda on 07/04/2023. She received Keytruda first.  Later she started Taxol and few minutes into infusion, she had severe infusion reaction with chest pain, hypotension, diaphoresis, hypoxia.  EKG was unremarkable.  Blood pressure and O2 sats normalized after additional allergy medication and supplemental oxygen.  We will not rechallenge her with Taxol.  Plan made to switch treatments to Taxotere and cyclophosphamide  regimen, to be given every 3 weeks for up to 6 cycles.  On 07/13/2023, she had infusion reaction to Taxotere as well, although not as severe as she had with Taxol.  She mainly had persistent lower back pain, increased blood pressure and some chest discomfort, which resolved quickly with pausing the infusion and Tylenol.  Plan to switch treatments to dose dense AC, followed by weekly Abraxane.  This was discussed with the patient over the phone today and she was agreeable.  She already had education for [facility] regimen previously.  Tentative start date on 07/27/2023.  I will see her on 07/25/2023 for reevaluation and labs and plan for treatment on 07/27/2023.  Per patient preference, we will arrange for chemotherapy on Thursdays going forward, to accommodate her work schedule.

## 2023-07-18 NOTE — Assessment & Plan Note (Signed)
Patient has a history of hypertension and was previously misdiagnosed with congestive heart failure. Currently on Amlodipine once daily, Carvedilol twice daily, Spironolactone four times daily, and Furosemide once daily. -Continue current medications. -Emphasize the importance of good blood pressure control, especially in the context of planned chemotherapy.

## 2023-07-18 NOTE — Assessment & Plan Note (Addendum)
She was found to have extensive bruising on the left breast and near left-sided Port-A-Cath on 07/11/2023.  She had fluoroscopic evaluation on 07/13/2023 and the Port-A-Cath reservoir was noted to be flipped.  She is scheduled to have Port-A-Cath revision on 07/20/2023.

## 2023-07-19 ENCOUNTER — Other Ambulatory Visit: Payer: Self-pay | Admitting: Radiology

## 2023-07-19 DIAGNOSIS — Z452 Encounter for adjustment and management of vascular access device: Secondary | ICD-10-CM

## 2023-07-20 ENCOUNTER — Encounter (HOSPITAL_COMMUNITY): Payer: Self-pay

## 2023-07-20 ENCOUNTER — Ambulatory Visit (HOSPITAL_COMMUNITY)
Admission: RE | Admit: 2023-07-20 | Discharge: 2023-07-20 | Disposition: A | Discharge status: Home / Self Care | Payer: Medicaid Other | Source: Ambulatory Visit | Attending: Radiology | Admitting: Radiology

## 2023-07-20 ENCOUNTER — Other Ambulatory Visit: Payer: Self-pay

## 2023-07-20 ENCOUNTER — Ambulatory Visit: Payer: No Typology Code available for payment source

## 2023-07-20 ENCOUNTER — Other Ambulatory Visit: Payer: Self-pay | Admitting: Radiology

## 2023-07-20 DIAGNOSIS — C50911 Malignant neoplasm of unspecified site of right female breast: Secondary | ICD-10-CM | POA: Diagnosis not present

## 2023-07-20 DIAGNOSIS — Z452 Encounter for adjustment and management of vascular access device: Secondary | ICD-10-CM | POA: Diagnosis present

## 2023-07-20 DIAGNOSIS — I10 Essential (primary) hypertension: Secondary | ICD-10-CM | POA: Diagnosis not present

## 2023-07-20 HISTORY — PX: IR IMAGING GUIDED PORT INSERTION: IMG5740

## 2023-07-20 HISTORY — PX: IR REMOVAL TUN ACCESS W/ PORT W/O FL MOD SED: IMG2290

## 2023-07-20 LAB — CBC
HCT: 38.2 % (ref 36.0–46.0)
Hemoglobin: 12.3 g/dL (ref 12.0–15.0)
MCH: 27 pg (ref 26.0–34.0)
MCHC: 32.2 g/dL (ref 30.0–36.0)
MCV: 84 fL (ref 80.0–100.0)
Platelets: 311 10*3/uL (ref 150–400)
RBC: 4.55 MIL/uL (ref 3.87–5.11)
RDW: 14.8 % (ref 11.5–15.5)
WBC: 4.8 10*3/uL (ref 4.0–10.5)
nRBC: 0 % (ref 0.0–0.2)

## 2023-07-20 LAB — PROTIME-INR
INR: 1.1 (ref 0.8–1.2)
Prothrombin Time: 14 s (ref 11.4–15.2)

## 2023-07-20 MED ORDER — HEPARIN SOD (PORK) LOCK FLUSH 100 UNIT/ML IV SOLN
INTRAVENOUS | Status: AC
Start: 2023-07-20 — End: ?
  Filled 2023-07-20: qty 5

## 2023-07-20 MED ORDER — CEFAZOLIN SODIUM-DEXTROSE 2-4 GM/100ML-% IV SOLN
INTRAVENOUS | Status: AC | PRN
  Administered 2023-07-20: 2 g via INTRAVENOUS

## 2023-07-20 MED ORDER — FENTANYL CITRATE (PF) 100 MCG/2ML IJ SOLN
INTRAMUSCULAR | Status: AC | PRN
  Administered 2023-07-20: 50 ug via INTRAVENOUS
  Administered 2023-07-20: 25 ug via INTRAVENOUS
  Administered 2023-07-20 (×2): 50 ug via INTRAVENOUS

## 2023-07-20 MED ORDER — MIDAZOLAM HCL 2 MG/2ML IJ SOLN
INTRAMUSCULAR | Status: AC
  Filled 2023-07-20: qty 2

## 2023-07-20 MED ORDER — SODIUM CHLORIDE 0.9 % IV SOLN: INTRAVENOUS | Status: DC

## 2023-07-20 MED ORDER — LIDOCAINE-EPINEPHRINE 1 %-1:100000 IJ SOLN
INTRAMUSCULAR | Status: AC
  Filled 2023-07-20: qty 1

## 2023-07-20 MED ORDER — FENTANYL CITRATE (PF) 100 MCG/2ML IJ SOLN
INTRAMUSCULAR | Status: AC
  Filled 2023-07-20: qty 2

## 2023-07-20 MED ORDER — CEFAZOLIN SODIUM-DEXTROSE 2-4 GM/100ML-% IV SOLN
INTRAVENOUS | Status: AC
Start: 2023-07-20 — End: ?
  Filled 2023-07-20: qty 100

## 2023-07-20 MED ORDER — MIDAZOLAM HCL 2 MG/2ML IJ SOLN
INTRAMUSCULAR | Status: AC | PRN
Start: 2023-07-20 — End: 2023-07-20
  Administered 2023-07-20: .5 mg via INTRAVENOUS
  Administered 2023-07-20: 1 mg via INTRAVENOUS
  Administered 2023-07-20 (×2): .5 mg via INTRAVENOUS

## 2023-07-20 NOTE — Progress Notes (Signed)
Patient was given discharge instructions. She verbalized understanding.

## 2023-07-20 NOTE — H&P (Signed)
Chief Complaint: left internal jugular vein port a cath flipped reservoir - port a cath revision or replacement   Referring Provider(s): Pasam, Avinash   Supervising Physician: Richarda Overlie  Patient Status: [facility] - Out-pt  History of Present Illness: Jasmine Novak is a 53 y.o. female with a history of hypertension and breast cancer.  Pt has been followed by Oncology Dr. Arlana Pouch since December 2024.  Pt had a PET scan on 06/13/23 revealing local nodal metastasis of the subpectoralis lymph nodes and no evidence of distant metastatic disease.   Pt is known to [place] for a recent placement of port-a-catheter on 07/03/23 with Dr. Fredia Sorrow in the left inernal jugular vein.  Complications arose with the port-a-cath post placement and an IR CV line injection was performed 07/13/23 with Dr. Elby Showers revealing a flipped port a cath.  Pt was scheduled for a port a cath revision or replacement on 07/20/23.    Patient is Full Code  Past Medical History:  Diagnosis Date   Herpes simplex    states hasn't had outbreak since she was "young"   Hypertension     Past Surgical History:  Procedure Laterality Date   BREAST BIOPSY Right 05/23/2023   [place] RT BREAST BX W LOC DEV 1ST LESION IMG BX SPEC US GUIDE 05/23/2023 [facility]   CARPAL TUNNEL RELEASE  1996 & 1999   bilateral wrist   IR CV LINE INJECTION  07/13/2023   IR IMAGING GUIDED PORT INSERTION  07/03/2023    Allergies: Paclitaxel, Docetaxel, Lisinopril, and Fosaprepitant  Medications: Prior to Admission medications   Medication Sig Start Date End Date Taking? Authorizing Provider  amLODipine (NORVASC) 10 MG tablet Take 1 tablet (10 mg total) by mouth daily. 08/22/18  Yes Mike Gip, FNP  carvedilol (COREG) 12.5 MG tablet Take 12.5 mg by mouth. 06/30/22  Yes [provider]  furosemide (LASIX) 40 MG tablet TAKE 1 TABLET(40 MG) BY MOUTH DAILY 10/01/18  Yes Mike Gip, FNP  ibuprofen (ADVIL) 200 MG tablet Take 200 mg by mouth every 6  (six) hours as needed.   Yes [provider]  irbesartan (AVAPRO) 300 MG tablet Take by mouth. 06/16/22  Yes [provider]  spironolactone (ALDACTONE) 25 MG tablet Take by mouth. 06/16/22  Yes [provider]  traMADol (ULTRAM) 50 MG tablet Take 1 tablet (50 mg total) by mouth every 6 (six) hours as needed. 07/06/23  Yes Pasam, Archie Patten, MD     Family History  Problem Relation Age of Onset   Stroke Father    Heart disease Daughter    Diabetes Maternal Grandmother     Social History   Socioeconomic History   Marital status: Single    Spouse name: Not on file   Number of children: Not on file   Years of education: Not on file   Highest education level: Not on file  Occupational History   Not on file  Tobacco Use   Smoking status: Never   Smokeless tobacco: Never  Vaping Use   Vaping status: Never Used  Substance and Sexual Activity   Alcohol use: No    Comment: wine once a month   Drug use: No   Sexual activity: Yes    Birth control/protection: Condom  Other Topics Concern   Not on file  Social History Narrative   Not on file   Social Drivers of Health   Financial Resource Strain: Not on file  Food Insecurity: Food Insecurity Present (06/06/2023)  Hunger Vital Sign    Worried About Running Out of Food in the Last Year: Often true    Ran Out of Food in the Last Year: Often true  Transportation Needs: No Transportation Needs (06/06/2023)   PRAPARE - Administrator, Civil Service (Medical): No    Lack of Transportation (Non-Medical): No  Physical Activity: Not on file  Stress: Not on file  Social Connections: Not on file     Review of Systems: A 12 point ROS discussed and pertinent positives are indicated in the HPI above.  All other systems are negative.  Review of Systems  Constitutional:  Negative for chills, fatigue and fever.  Respiratory:  Negative for cough, shortness of breath and wheezing.   Gastrointestinal:   Negative for diarrhea, nausea and vomiting.  Neurological:  Negative for dizziness and headaches.  Psychiatric/Behavioral:  Negative for agitation, behavioral problems and confusion.     Vital Signs: BP (!) 169/76   Pulse 77   Temp 98.7 F (37.1 C) (Oral)   Resp 16   Ht 5\' 6"  (1.676 m)   Wt (!) 311 lb (141.1 kg)   LMP 01/17/2022 (Approximate)   SpO2 99%   BMI 50.20 kg/m   Advance Care Plan: The advanced care place/surrogate decision maker was discussed at the time of visit and the patient did not wish to discuss or was not able to name a surrogate decision maker or provide an advance care plan.  Physical Exam Constitutional:      Appearance: She is well-developed. She is obese.  HENT:     Head: Atraumatic.     Mouth/Throat:     Mouth: Mucous membranes are moist.  Cardiovascular:     Rate and Rhythm: Normal rate and regular rhythm.     Comments: Normal with extraneous beat  Pulmonary:     Effort: Pulmonary effort is normal.     Breath sounds: Normal breath sounds.  Abdominal:     General: Bowel sounds are normal.     Palpations: Abdomen is soft.  Musculoskeletal:        General: Normal range of motion.  Skin:    General: Skin is warm.  Neurological:     Mental Status: She is alert and oriented to person, place, and time.  Psychiatric:        Mood and Affect: Mood normal.        Behavior: Behavior normal.     Imaging: IR CV Line Injection Result Date: 07/13/2023 INDICATION: 53 year old female with history of malfunctioning Port-A-Cath. EXAM: FLUOROSCOPIC GUIDED PORT A CATHETER CHECK MEDICATIONS: None. CONTRAST:  5 mL Omnipaque 300 FLUOROSCOPY TIME:  Six mGy COMPLICATIONS: None immediate. TECHNIQUE: The procedure, risks, benefits, and alternatives were explained to the patient and informed written consent was obtained. A timeout was performed prior to the initiation of the procedure. The patient's chest port a catheter was accessed by the IR RN. The patient was placed  supine on the fluoroscopy table. A preprocedural spot fluoroscopic image was obtained of the chest in existing port a catheter. Note was made of difficulty aspirating blood from the port a catheter. Contrast was injected via the Port a catheter and images were reviewed. The needle was removed the patient tolerated the procedure well without immediate postprocedural complication. FINDINGS: Flipped Port-A-Cath. IMPRESSION: The Port-A-Cath reservoir is flipped. PLAN: Interventional Radiology has arrange for Port-A-Cath revision. Recommend larger reservoir to minimize chances of additional flipping in the future. Marliss Coots, MD Vascular and Interventional Radiology  Special[facility] Radiology Electronically Signed   By: Marliss Coots M.D.   On: 07/13/2023 10:50   IR IMAGING GUIDED PORT INSERTION Result Date: 07/04/2023 CLINICAL DATA:  Right breast carcinoma and need for porta cath to begin chemotherapy. EXAM: IMPLANTED PORT A CATH PLACEMENT WITH ULTRASOUND AND FLUOROSCOPIC GUIDANCE ANESTHESIA/SEDATION: Moderate (conscious) sedation was employed during this procedure. A total of Versed 2.0 mg and Fentanyl 100 mcg was administered intravenously. Moderate Sedation Time: 43 minutes. The patient's level of consciousness and vital signs were monitored continuously by radiology nursing throughout the procedure under my direct supervision. FLUOROSCOPY: 1 minute and 24 seconds.  25.0 mGy. PROCEDURE: The procedure, risks, benefits, and alternatives were explained to the patient. Questions regarding the procedure were encouraged and answered. The patient understands and consents to the procedure. A time-out was performed prior to initiating the procedure. Ultrasound was utilized to confirm patency of the left internal jugular vein. An ultrasound image was saved and recorded. The left neck and chest were prepped with chlorhexidine in a sterile fashion, and a sterile drape was applied covering the operative field. Maximum  barrier sterile technique with sterile gowns and gloves were used for the procedure. Local anesthesia was provided with 1% lidocaine. After creating a small venotomy incision, a 21 gauge needle was advanced into the left internal jugular vein under direct, real-time ultrasound guidance. Ultrasound image documentation was performed. After securing guidewire access, an 8 Fr dilator was placed. A J-wire was kinked to measure appropriate catheter length. A subcutaneous port pocket was then created along the upper chest wall utilizing sharp and blunt dissection. Portable cautery was utilized. The pocket was irrigated with sterile saline. A single lumen power injectable port was chosen for placement. The 8 Fr catheter was tunneled from the port pocket site to the venotomy incision. The port was placed in the pocket. External catheter was trimmed to appropriate length based on guidewire measurement. At the venotomy, an 8 Fr peel-away sheath was placed over a guidewire. The catheter was then placed through the sheath and the sheath removed. Final catheter positioning was confirmed and documented with a fluoroscopic spot image. The port was accessed with a needle and aspirated and flushed with heparinized saline. The access needle was removed. The venotomy and port pocket incisions were closed with subcutaneous 3-0 Monocryl and subcuticular 4-0 Vicryl. Dermabond was applied to both incisions. COMPLICATIONS: COMPLICATIONS None FINDINGS: After catheter placement, the tip lies at the cavo-atrial junction. The catheter aspirates normally and is ready for immediate use. IMPRESSION: Placement of single lumen port a cath via left internal jugular vein. The catheter tip lies at the cavo-atrial junction. A power injectable port a cath was placed and is ready for immediate use. Electronically Signed   By: Irish Lack M.D.   On: 07/04/2023 10:51    Labs:  CBC: Recent Labs    06/06/23 1038 07/04/23 0835 07/11/23 1101  07/20/23 1008  WBC 8.0 7.0 7.6 4.8  HGB 12.3 11.6* 11.9* 12.3  HCT 38.2 36.3 37.1 38.2  PLT 266 249 211 311    COAGS: No results for input(s): "INR", "APTT" in the last 8760 hours.  BMP: Recent Labs    05/07/23 1701 06/06/23 1038 07/04/23 0835 07/11/23 1101  NA 136 137 137 135  K 4.0 3.9 3.6 4.0  CL 103 105 103 102  CO2 27 25 28 27   GLUCOSE 91 104* 148* 101*  BUN 13 13 15 11   CALCIUM 8.9 8.9 8.9 8.8*  CREATININE 0.84 0.80 0.77  0.75  GFRNONAA >60 >60 >60 >60    LIVER FUNCTION TESTS: Recent Labs    05/07/23 1701 06/06/23 1038 07/04/23 0835 07/11/23 1101  BILITOT 0.3 0.5 0.4 0.5  AST 16 18 14* 13*  ALT 17 18 16 16   ALKPHOS 88 103 94 87  PROT 7.7 8.1 7.9 7.8  ALBUMIN 3.8 4.1 3.6 3.7    TUMOR MARKERS: Recent Labs    06/06/23 1038  CEA 1.12    Assessment and Plan:  Pt has inflammatory breast cancer and is known to [place] from previous port-a-cath placement with Dr. Fredia Sorrow on 07/03/23.  CV line injection on 07/13/23 revealed flipped port-a-cath reservoir. Pt scheduled for port a cath revision and possible replacement 07/20/23.    Risks and benefits of image guided port-a-catheter revision and placement was discussed with the patient including, but not limited to bleeding, infection, pneumothorax, or fibrin sheath development and need for additional procedures.  All of the patient's questions were answered, patient is agreeable to proceed. Consent signed and in chart.   Thank you for allowing our service to participate in Delainie L Rochin 's care.  Electronically Signed: Loman Brooklyn, PA-C   07/20/2023, 10:20 AM    I spent a total of    15 Minutes in face to face in clinical consultation, greater than 50% of which was counseling/coordinating care for port a cath revision and possible port a cath replacement.

## 2023-07-20 NOTE — Procedures (Signed)
Interventional Radiology Procedure:   Indications: Port dysfunction, port was flipped  Procedure: Port removal and new port placement  Findings: Left chest port was flipped.  Could not exchange a new port catheter over a wire.  Therefore, new left internal jugular puncture was made and same pocket used.  New port is larger and secured to chest wall.  Tip intentionally placed in right atrium because the tip was retracting into SVC when breast tissue moved.     Complications: None     EBL: Minimal, less than 10 ml  Plan: Discharge in one hour.  Keep port site and incisions dry for at least 24 hours.     Riverlyn Kizziah R. Lowella Dandy, MD  Pager: [phone]

## 2023-07-24 ENCOUNTER — Ambulatory Visit: Payer: No Typology Code available for payment source

## 2023-07-24 ENCOUNTER — Inpatient Hospital Stay: Payer: Medicaid Other

## 2023-07-25 ENCOUNTER — Ambulatory Visit: Payer: No Typology Code available for payment source

## 2023-07-25 ENCOUNTER — Ambulatory Visit: Payer: No Typology Code available for payment source | Admitting: Oncology

## 2023-07-25 ENCOUNTER — Encounter: Payer: Self-pay | Admitting: Oncology

## 2023-07-25 ENCOUNTER — Other Ambulatory Visit: Payer: No Typology Code available for payment source

## 2023-07-25 ENCOUNTER — Other Ambulatory Visit: Payer: Self-pay

## 2023-07-25 ENCOUNTER — Inpatient Hospital Stay (HOSPITAL_BASED_OUTPATIENT_CLINIC_OR_DEPARTMENT_OTHER): Payer: Medicaid Other | Admitting: Oncology

## 2023-07-25 ENCOUNTER — Inpatient Hospital Stay: Payer: Medicaid Other

## 2023-07-25 VITALS — BP 181/76 | HR 82 | Temp 97.9°F | Resp 16 | Wt 321.2 lb

## 2023-07-25 DIAGNOSIS — C50911 Malignant neoplasm of unspecified site of right female breast: Secondary | ICD-10-CM

## 2023-07-25 DIAGNOSIS — I1A0 Resistant hypertension: Secondary | ICD-10-CM

## 2023-07-25 DIAGNOSIS — Z452 Encounter for adjustment and management of vascular access device: Secondary | ICD-10-CM

## 2023-07-25 DIAGNOSIS — Z5111 Encounter for antineoplastic chemotherapy: Secondary | ICD-10-CM | POA: Diagnosis not present

## 2023-07-25 DIAGNOSIS — G893 Neoplasm related pain (acute) (chronic): Secondary | ICD-10-CM | POA: Diagnosis not present

## 2023-07-25 LAB — CMP (CANCER CENTER ONLY)
ALT: 14 U/L (ref 0–44)
AST: 12 U/L — ABNORMAL LOW (ref 15–41)
Albumin: 3.6 g/dL (ref 3.5–5.0)
Alkaline Phosphatase: 96 U/L (ref 38–126)
Anion gap: 5 (ref 5–15)
BUN: 12 mg/dL (ref 6–20)
CO2: 28 mmol/L (ref 22–32)
Calcium: 8.7 mg/dL — ABNORMAL LOW (ref 8.9–10.3)
Chloride: 104 mmol/L (ref 98–111)
Creatinine: 0.83 mg/dL (ref 0.44–1.00)
GFR, Estimated: >60 mL/min (ref >60)
Glucose, Bld: 142 mg/dL — ABNORMAL HIGH (ref 70–99)
Potassium: 3.4 mmol/L — ABNORMAL LOW (ref 3.5–5.1)
Sodium: 137 mmol/L (ref 135–145)
Total Bilirubin: 0.4 mg/dL (ref 0.0–1.2)
Total Protein: 7.3 g/dL (ref 6.5–8.1)

## 2023-07-25 LAB — CBC WITH DIFFERENTIAL (CANCER CENTER ONLY)
Abs Immature Granulocytes: 0.01 10*3/uL (ref 0.00–0.07)
Basophils Absolute: 0 10*3/uL (ref 0.0–0.1)
Basophils Relative: 1 %
Eosinophils Absolute: 0.1 10*3/uL (ref 0.0–0.5)
Eosinophils Relative: 2 %
HCT: 34.6 % — ABNORMAL LOW (ref 36.0–46.0)
Hemoglobin: 11.1 g/dL — ABNORMAL LOW (ref 12.0–15.0)
Immature Granulocytes: 0 %
Lymphocytes Relative: 23 %
Lymphs Abs: 1 10*3/uL (ref 0.7–4.0)
MCH: 27.1 pg (ref 26.0–34.0)
MCHC: 32.1 g/dL (ref 30.0–36.0)
MCV: 84.4 fL (ref 80.0–100.0)
Monocytes Absolute: 0.5 10*3/uL (ref 0.1–1.0)
Monocytes Relative: 11 %
Neutro Abs: 2.8 10*3/uL (ref 1.7–7.7)
Neutrophils Relative %: 63 %
Platelet Count: 260 10*3/uL (ref 150–400)
RBC: 4.1 MIL/uL (ref 3.87–5.11)
RDW: 14.7 % (ref 11.5–15.5)
WBC Count: 4.4 10*3/uL (ref 4.0–10.5)
nRBC: 0 % (ref 0.0–0.2)

## 2023-07-25 NOTE — Progress Notes (Unsigned)
 Patient seen by Dr. Archie Patten Pasam today  Vitals are within treatment parameters:No (Please specify and give further instructions.)  Patient was hypertensive today during visit but had not taken all of her medications. Okay to treat on 07/27/23  Labs are within treatment parameters: Yes   Treatment plan has been signed: Yes   Per physician team, Patient is ready for treatment and there are NO modifications to the treatment plan.

## 2023-07-25 NOTE — Progress Notes (Signed)
  [facility]  ONCOLOGY CLINIC PROGRESS NOTE   Patient Care Team: Carolin Coy as PCP - General (Physician Assistant)  PATIENT NAME: Jasmine Novak   MR#: [id number] DOB: [birth year]  Date of visit: 07/25/2023   ASSESSMENT & PLAN:   Sharlett Lienemann Canto is a 53 y.o. lady with a past medical history of hypertension, was referred to our clinic in December 2024 for newly diagnosed inflammatory carcinoma of right breast.  ER weakly positive at 10%, PR negative, HER2/neu negative.  Clinically behaving like triple negative breast cancer.  Had severe infusion reaction to first dose of Taxol and later moderate reaction to Taxotere.  Inflammatory carcinoma of breast, right (HCC) Recently diagnosed with right breast mass (6 cm) and biopsy confirmed malignancy. Estrogen receptor weakly positive at 10%, progesterone receptor negative, and HER2 negative. Left breast has a small mass (6-8 mm) not concerning for malignancy. No lymph node involvement on biopsy but ultrasound was concerning for 3-4 right axillary lymph nodes.   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  -Previously I discussed staging, prognosis, plan of care, treatment options.  Reviewed NCCN guidelines.  On 06/13/2023, staging PET/CT showed intense metabolic activity within the right breast mass consistent with primary breast carcinoma.  Hypermetabolic right axillary and subpectoralis lymph nodes consistent with local nodal metastasis.  No evidence of distant metastatic disease.    Given no evidence of metastatic disease, plan made to treat her with neoadjuvant chemoimmunotherapy using KEYNOTE-522 protocol (carboplatin and paclitaxel plus Keytruda for 4 cycles followed by Adriamycin, Cytoxan and Keytruda for 4 cycles followed by 9 more doses of adjuvant Keytruda).  Echocardiogram on 06/13/2023 showed preserved LV EF.  -She did have an appointment with Dr. Dwain Sarna for surgical  consultation on 06/16/2023.    -She went [place] from [place]/21/[place]23 until 06/26/2023 and deferred treatment initiation until that point.   -She had Port-A-Cath placement on 07/03/2023.  Plan made to proceed with cycle 1, day 1 of carboplatin, paclitaxel and Keytruda on 07/04/2023. She received Keytruda first.  Later she started Taxol and few minutes into infusion, she had severe infusion reaction with chest pain, hypotension, diaphoresis, hypoxia.  EKG was unremarkable.  Blood pressure and O2 sats normalized after additional allergy medication and supplemental oxygen.  We will not rechallenge her with Taxol.  Plan made to switch treatments to Taxotere and cyclophosphamide regimen, to be given every 3 weeks for up to 6 cycles.  On 07/13/2023, she had infusion reaction to Taxotere as well, although not as severe as she had with Taxol.  She mainly had persistent lower back pain, increased blood pressure and some chest discomfort, which resolved quickly with pausing the infusion and Tylenol.  Plan to switch treatments to dose dense AC, followed by weekly Abraxane.  This was discussed with the patient in detail today and she was agreeable.  She already had education for [facility] regimen.  Tentative start date on 07/27/2023.  Labs today reveal no dose-limiting toxicities.  Will proceed with cycle 1 of AC on 07/27/2023.  We will arrange for toxicity evaluation on 08/03/2023.  Per patient preference, we will arrange for chemotherapy on Thursdays going forward, to accommodate her work schedule.  Cancer associated pain Patient reports discomfort and tenderness in the area of the right breast mass. -Previously provided prescription for Tramadol for pain management as needed, with instructions for potential side effects (drowsiness, constipation) and the use of a stool softener  if constipation occurs.  -Pain and discomfort have improved compared to prior.  Resistant hypertension Patient has a history  of hypertension and was previously misdiagnosed with congestive heart failure. Currently on Amlodipine once daily, Carvedilol twice daily, irbesartan daily, spironolactone daily, and Furosemide once daily. -Continue current medications. -Emphasize the importance of good blood pressure control, especially in the context of planned chemotherapy.  Encounter for care related to Port-a-Cath She was found to have extensive bruising on the left breast and near left-sided Port-A-Cath on 07/11/2023.  She had fluoroscopic evaluation on 07/13/2023 and the Port-A-Cath reservoir was noted to be flipped.  She underwent Port-A-Cath revision on 07/20/2023.  She is scheduled with our nurse practitioner, Santiago Glad for the next couple of appointments.  I will plan to see her around the time that she is due for cycle 3 of dose dense AC.  I reviewed lab results and outside records for this visit and discussed relevant results with the patient. Diagnosis, plan of care and treatment options were also discussed in detail with the patient. Opportunity provided to ask questions and answers provided to her apparent satisfaction. Provided instructions to call our clinic with any problems, questions or concerns prior to return visit. I recommended to continue follow-up with PCP and sub-specialists. She verbalized understanding and agreed with the plan.   NCCN guidelines have been consulted in the planning of this patient's care.  I spent a total of 30 minutes during this encounter with the patient including review of chart and various tests results, discussions about plan of care and coordination of care plan.  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  Current Treatment: Plan made initially for neoadjuvant chemotherapy using KEYNOTE-522 protocol (carboplatin and paclitaxel plus Keytruda for 4 cycles followed by Adriamycin, Cytoxan and Keytruda for 4 cycles followed by 9 more doses of adjuvant Keytruda).  Started cycle 1 of chemotherapy on 07/04/2023.  She received Keytruda first.  Later she started Taxol and few minutes into infusion, she had severe infusion reaction with chest pain, hypotension, diaphoresis, hypoxia.  EKG was unremarkable.  Blood pressure and O2 sats normalized after additional allergy medication and supplemental oxygen.  We will not rechallenge her with Taxol.  Plan made to switch treatments to Taxotere and cyclophosphamide regimen, to be given every 3 weeks for up to 6 cycles.  He had moderate reaction to Taxotere as well.  Hence plan made to switch treatments to [facility] regimen in a dose dense fashion, tentatively starting from 07/27/2023.  This will be followed by Abraxane weekly.  INTERVAL HISTORY:    Discussed the use of AI scribe software for clinical note transcription with the patient, who gave verbal consent to proceed.   Maisee L Nebel is here today for repeat clinical assessment.   On 07/20/2023 she underwent port revision. She reports improvement in bruising on the left side of her chest/breast. She has experienced allergic reactions to Emend, Taxol, Taxotere.  Plan is to switch treatments to [facility] followed by Abraxane.  The patient also reports high blood pressure, which she attributes to stress over her treatment. Despite these challenges, the patient is hopeful and  has a support system of people praying for her.  I have reviewed the past medical history, past surgical history, social history and family history with the patient and they are unchanged from previous note.  HISTORY OF PRESENT ILLNESS:   ONCOLOGY  HISTORY:  The patient has not been getting regular mammograms previously and has not seen a primary doctor recently.    She reports lump in her right breast that she noticed in mid-October 2024. The lump was causing discomfort, particularly when she is moving or wearing a bra. The patient reports that the area around the lump is tender and warm to the touch. She has been taking ibuprofen to manage the discomfort. She is right-handed and has been trying to avoid using her right arm due to the discomfort from the lump.    Since pain and swelling persisted, she presented to the ED on 05/07/2023.  A CT chest with contrast was obtained on 05/07/2023 which showed 5 x 4.2 cm mass within the right breast with diffuse cutaneous thickening overlying the right breast, highly suspicious for primary breast malignancy.  Right axillary lymphadenopathy measuring up to 13 mm, suspicious for metastatic disease.  A 3 mm subpleural right upper lobe lung nodule, indeterminate. Coronary artery calcifications and left atrial enlargement was also noted.   ED provider reached out to our clinic for further assistance and management.  We arranged for diagnostic mammogram, ultrasound and biopsy.   On 05/22/2023, diagnostic mammogram showed 5.3 cm highly suspicious mass in the right breast associated with adjacent inflammation and overlying skin thickening consistent with inflammatory breast carcinoma.  3-4 abnormal right axillary lymph nodes, suspicious for metastatic disease.  2 adjacent benign clusters of cysts in the left breast.  No evidence of left breast malignancy.  Ultrasound showed similar findings.   On 05/23/2023 she underwent core needle biopsy of right breast mass.  Pathology showed invasive ductal carcinoma, mitotic score 3, grade 3.  ER weakly positive at 10%.  PR negative, HER2/neu was 2+ by IHC, negative by FISH.  High Ki-67 index of 80%.   With these findings, she presented to clinic to establish care with [place] on  06/06/2023.  Request submitted for PET/CT and echocardiogram.   On 06/13/2023, staging PET/CT showed intense metabolic activity within the right breast mass consistent with primary breast carcinoma.  Hypermetabolic right axillary and subpectoralis lymph nodes consistent with local nodal metastasis.  No evidence of distant metastatic disease.    Given no evidence of metastatic disease, plan is to treat her with neoadjuvant chemoimmunotherapy using KEYNOTE-522 protocol (carboplatin and paclitaxel plus Keytruda for 4 cycles followed by Adriamycin, Cytoxan and Keytruda for 4 cycles followed by 9 more doses of adjuvant Keytruda).  Started systemic treatments from 07/04/2023.  Started cycle 1 of chemotherapy on 07/04/2023.  She received Keytruda first.  Later she started Taxol and few minutes into infusion, she had severe infusion reaction with chest pain, hypotension, diaphoresis, hypoxia.  EKG was unremarkable.  Blood pressure and O2 sats normalized after additional allergy medication and supplemental oxygen.   We will not rechallenge her with Taxol.   Plan made to switch treatments to Taxotere and cyclophosphamide regimen, to be given every 3 weeks for up to 6 cycles.  On 07/13/2023, she had infusion reaction to Taxotere as well, although not as severe as she had with Taxol.  She mainly had persistent lower back pain, increased blood pressure and some chest discomfort, which resolved quickly with pausing the infusion and Tylenol.  Plan to switch treatments to dose dense AC, followed by weekly Abraxane.  Tentative start date on 07/27/2023.   Oncology History  Inflammatory carcinoma of breast, right (HCC)  06/08/2023 Initial Diagnosis   Inflammatory carcinoma of breast, right (HCC)   06/15/2023 Cancer Staging   Staging form: Breast, AJCC 8th Edition - Clinical: Stage IIIC (cT4b, cN2, cM0, G3, ER+, PR-, HER2-) - Signed by Meryl Crutch, MD on 06/15/2023 Histologic grading system: 3 grade system   07/04/2023  - 07/04/2023 Chemotherapy   Patient is on Treatment Plan : BREAST Pembrolizumab (200) D1 + Carboplatin (5) D1 + Paclitaxel (80) D1,8,15 q21d X 4 cycles / Pembrolizumab (200) D1 + AC D1 q21d x 4 cycles     07/13/2023 - 07/14/2023 Chemotherapy   Patient is on Treatment Plan : BREAST TC q21d     07/27/2023 -  Chemotherapy   Patient is on Treatment Plan : BREAST DOSE DENSE AC q14d / Nab-Paclitaxel (Abraxane) q7d         REVIEW OF SYSTEMS:   Review of Systems - Oncology  All other pertinent systems were reviewed with the patient and are negative.  ALLERGIES: She is allergic to paclitaxel, docetaxel, lisinopril, and fosaprepitant.  MEDICATIONS:  Current Outpatient Medications  Medication Sig Dispense Refill   amLODipine (NORVASC) 10 MG tablet Take 1 tablet (10 mg total) by mouth daily. 90 tablet 0   carvedilol (COREG) 12.5 MG tablet Take 12.5 mg by mouth.     furosemide (LASIX) 40 MG tablet TAKE 1 TABLET(40 MG) BY MOUTH DAILY 30 tablet 2   ibuprofen (ADVIL) 200 MG tablet Take 200 mg by mouth every 6 (six) hours as needed.     irbesartan (AVAPRO) 300 MG tablet Take by mouth.     spironolactone (ALDACTONE) 25 MG tablet Take by mouth.     traMADol (ULTRAM) 50 MG tablet Take 1 tablet (50 mg total) by mouth every 6 (six) hours as needed. 90 tablet 0   No current facility-administered medications for this visit.     VITALS:   Blood pressure (!) 181/76, pulse 82, temperature 97.9 F (36.6 C), temperature source Temporal, resp. rate 16, weight (!) 321 lb 3 oz (145.7 kg), last menstrual period 01/17/2022, SpO2 100%.  Wt Readings from Last 3 Encounters:  07/25/23 (!) 321 lb 3 oz (145.7 kg)  07/20/23 (!) 311 lb (141.1 kg)  07/11/23 (!) 316 lb 9.6 oz (143.6 kg)    Body mass index is 51.84 kg/m.   Onc Performance Status - 07/25/23 1123       ECOG Perf Status   ECOG Perf Status Restricted in physically strenuous activity but ambulatory and able to carry out work of a light or sedentary  nature, e.g., light house work, office work      KPS SCALE   KPS % SCORE Normal, no compliants, no evidence of disease               PHYSICAL EXAM:   Physical Exam Constitutional:      General: She is not in acute distress.    Appearance: Normal appearance.  HENT:     Head: Normocephalic and atraumatic.  Eyes:     General: No scleral icterus.    Conjunctiva/sclera: Conjunctivae normal.  Cardiovascular:     Rate and Rhythm: Normal rate and regular rhythm.     Heart sounds: Normal heart sounds.  Pulmonary:     Effort: Pulmonary effort is normal.     Breath sounds: Normal breath sounds.  Chest:     Comments: At least 6.5 cm firm mass palpable in the lower, outer quadrant of right breast. [place] skin changes noted.  No inversion of nipple.  Right axillary fullness noted without definite lymphadenopathy palpable.  Bruising in various stages of healing noted in the upper half of left breast.  Left-sided Port-A-Cath in place, deep-seated.  Some bruising noted on the surface.  No tenderness. Abdominal:     General: There is no distension.  Musculoskeletal:     Right lower leg: No edema.     Left lower leg: No edema.  Neurological:     General: No focal deficit present.     Mental Status: She is alert and oriented to person, place, and time.  Psychiatric:        Mood and Affect: Mood normal.        Behavior: Behavior normal.        Thought Content: Thought content normal.      LABORATORY DATA:   I have reviewed the data as listed.  Results for orders placed or performed in visit on 07/25/23  CMP (Cancer Center only)  Result Value Ref Range   Sodium 137 135 - 145 mmol/L   Potassium 3.4 (L) 3.5 - 5.1 mmol/L   Chloride 104 98 - 111 mmol/L   CO2 28 22 - 32 mmol/L   Glucose, Bld 142 (H) 70 - 99 mg/dL   BUN 12 6 - 20 mg/dL   Creatinine 4.54 0.98 - 1.00 mg/dL   Calcium 8.7 (L) 8.9 - 10.3 mg/dL   Total Protein 7.3 6.5 - 8.1 g/dL   Albumin 3.6 3.5 - 5.0 g/dL    AST 12 (L) 15 - 41 U/L   ALT 14 0 - 44 U/L   Alkaline Phosphatase 96 38 - 126 U/L   Total Bilirubin 0.4 0.0 - 1.2 mg/dL   GFR, Estimated >11 >91 mL/min   Anion gap 5 5 - 15  CBC with Differential (Cancer Center Only)  Result Value Ref Range   WBC Count 4.4 4.0 - 10.5 K/uL   RBC 4.10 3.87 - 5.11 MIL/uL   Hemoglobin 11.1 (L) 12.0 - 15.0 g/dL   HCT 47.8 (L) 29.5 - 62.1 %   MCV 84.4 80.0 - 100.0 fL   MCH 27.1 26.0 - 34.0 pg   MCHC 32.1 30.0 - 36.0 g/dL   RDW 30.8 65.7 - 84.6 %   Platelet Count 260 150 - 400 K/uL   nRBC 0.0 0.0 - 0.2 %   Neutrophils Relative % 63 %   Neutro Abs 2.8 1.7 - 7.7 K/uL   Lymphocytes Relative 23 %   Lymphs Abs 1.0 0.7 - 4.0 K/uL   Monocytes Relative 11 %   Monocytes Absolute 0.5 0.1 - 1.0 K/uL   Eosinophils Relative 2 %   Eosinophils Absolute 0.1 0.0 - 0.5 K/uL   Basophils Relative 1 %   Basophils Absolute 0.0 0.0 - 0.1 K/uL   Immature Granulocytes 0 %   Abs Immature Granulocytes 0.01 0.00 - 0.07 K/uL      RADIOGRAPHIC STUDIES:  I have personally reviewed the radiological images as listed and agree with the findings in the report.  IR IMAGING GUIDED PORT INSERTION Result Date: 07/21/2023 INDICATION: 53 year old with right breast cancer. Port-A-Cath was placed on 07/03/2023. Port-A-Cath has flipped and needs revision or replacement. EXAM: 1. Removal of old port catheter 2. Placement of new subcutaneous Port-A-Cath using ultrasound and fluoroscopic guidance COMPARISON:  None Available.  MEDICATIONS: Moderate sedation ANESTHESIA/SEDATION: Moderate (conscious) sedation was employed during this procedure. A total of Versed 2.5 mg and fentanyl 175 mcg was administered intravenously at the order of the provider performing the procedure. Total intra-service moderate sedation time: 57 minutes. Patient's level of consciousness and vital signs were monitored continuously by radiology nurse throughout the procedure under the supervision of the provider performing the  procedure. FLUOROSCOPY TIME:  Radiation Exposure Index (as provided by the fluoroscopic device): 29 mGy Kerma COMPLICATIONS: None immediate. PROCEDURE: The procedure, risks, benefits, and alternatives were explained to the patient. Questions regarding the procedure were encouraged and answered. The patient understands and consents to the procedure. Time-out was performed. Left chest and neck were prepped and draped in sterile fashion. Maximal barrier sterile technique was utilized including caps, mask, sterile gowns, sterile gloves, sterile drape, hand hygiene and skin antiseptic. The remaining Dermabond was removed from the old chest incision. The skin was anesthetized around the old incision with 1% lidocaine. An incision was made over the old incision and inspection of the pocket demonstrated that the port was flipped. After manipulation of the port, it was noted that the catheter had pulled back into the left innominate vein. At this point, I decided the patient needed a larger port that would be less prone to flipping again. The old port was removed over a wire but unfortunately a new 8 French port catheter would not advance over a wire. Therefore, ultrasound was used to identify the left internal jugular vein. Ultrasound confirmed a patent left internal jugular vein and an image was saved for documentation. The left side of the neck was anesthetized with 1% lidocaine. A small incision was made. Using ultrasound guidance, 21 gauge needle was directed into the left internal jugular vein and micropuncture dilator set was placed. A new power injectable 8 French port catheter was tunneled between the existing subcutaneous pocket and the vein dermatotomy site. The micropuncture set was exchanged for the peel-away sheath and the catheter was advanced through the peel-away sheath and directed into the central venous system. The catheter was cut on the back end and attached to the new port. The new port was sutured to  the chest wall and placed within the pocket. Port was positioned within the right atrium because it was noted that the port would retract into the SVC with breast movement. The port was accessed and found to aspirate and flush well. Port was flushed with heparinized saline. Port pocket was closed using 2 layers of absorbable suture and Dermabond. The vein dermatotomy site was closed using absorbable suture and Dermabond. Dressings were placed. Fluoroscopic and ultrasound images were taken and saved for documentation. FINDINGS: The old left chest port was completely flipped. The old port was completely removed. A new larger power injectable port was placed and sutured to the chest wall. Catheter tip is in the right atrium. Catheter tip was intentionally placed in the right atrium because it was noted that the catheter has a tendency to retract due to the breast tissue. IMPRESSION: 1. Successful removal of the old left chest Port-A-Cath. 2. Successful placement of a new left internal jugular power injectable port. Electronically Signed   By: Richarda Overlie M.D.   On: 07/21/2023 08:22   IR REMOVAL TUN ACCESS W/ PORT W/O FL MOD SED Result Date: 07/21/2023 INDICATION: 53 year old with right breast cancer. Port-A-Cath was placed on 07/03/2023. Port-A-Cath has flipped and needs revision or replacement. EXAM: 1. Removal of old port catheter 2. Placement of  new subcutaneous Port-A-Cath using ultrasound and fluoroscopic guidance COMPARISON:  None Available. MEDICATIONS: Moderate sedation ANESTHESIA/SEDATION: Moderate (conscious) sedation was employed during this procedure. A total of Versed 2.5 mg and fentanyl 175 mcg was administered intravenously at the order of the provider performing the procedure. Total intra-service moderate sedation time: 57 minutes. Patient's level of consciousness and vital signs were monitored continuously by radiology nurse throughout the procedure under the supervision of the provider performing  the procedure. FLUOROSCOPY TIME:  Radiation Exposure Index (as provided by the fluoroscopic device): 29 mGy Kerma COMPLICATIONS: None immediate. PROCEDURE: The procedure, risks, benefits, and alternatives were explained to the patient. Questions regarding the procedure were encouraged and answered. The patient understands and consents to the procedure. Time-out was performed. Left chest and neck were prepped and draped in sterile fashion. Maximal barrier sterile technique was utilized including caps, mask, sterile gowns, sterile gloves, sterile drape, hand hygiene and skin antiseptic. The remaining Dermabond was removed from the old chest incision. The skin was anesthetized around the old incision with 1% lidocaine. An incision was made over the old incision and inspection of the pocket demonstrated that the port was flipped. After manipulation of the port, it was noted that the catheter had pulled back into the left innominate vein. At this point, I decided the patient needed a larger port that would be less prone to flipping again. The old port was removed over a wire but unfortunately a new 8 French port catheter would not advance over a wire. Therefore, ultrasound was used to identify the left internal jugular vein. Ultrasound confirmed a patent left internal jugular vein and an image was saved for documentation. The left side of the neck was anesthetized with 1% lidocaine. A small incision was made. Using ultrasound guidance, 21 gauge needle was directed into the left internal jugular vein and micropuncture dilator set was placed. A new power injectable 8 French port catheter was tunneled between the existing subcutaneous pocket and the vein dermatotomy site. The micropuncture set was exchanged for the peel-away sheath and the catheter was advanced through the peel-away sheath and directed into the central venous system. The catheter was cut on the back end and attached to the new port. The new port was sutured  to the chest wall and placed within the pocket. Port was positioned within the right atrium because it was noted that the port would retract into the SVC with breast movement. The port was accessed and found to aspirate and flush well. Port was flushed with heparinized saline. Port pocket was closed using 2 layers of absorbable suture and Dermabond. The vein dermatotomy site was closed using absorbable suture and Dermabond. Dressings were placed. Fluoroscopic and ultrasound images were taken and saved for documentation. FINDINGS: The old left chest port was completely flipped. The old port was completely removed. A new larger power injectable port was placed and sutured to the chest wall. Catheter tip is in the right atrium. Catheter tip was intentionally placed in the right atrium because it was noted that the catheter has a tendency to retract due to the breast tissue. IMPRESSION: 1. Successful removal of the old left chest Port-A-Cath. 2. Successful placement of a new left internal jugular power injectable port. Electronically Signed   By: Richarda Overlie M.D.   On: 07/21/2023 08:22   IR CV Line Injection Result Date: 07/13/2023 INDICATION: 53 year old female with history of malfunctioning Port-A-Cath. EXAM: FLUOROSCOPIC GUIDED PORT A CATHETER CHECK MEDICATIONS: None. CONTRAST:  5 mL Omnipaque 300  FLUOROSCOPY TIME:  Six mGy COMPLICATIONS: None immediate. TECHNIQUE: The procedure, risks, benefits, and alternatives were explained to the patient and informed written consent was obtained. A timeout was performed prior to the initiation of the procedure. The patient's chest port a catheter was accessed by the IR RN. The patient was placed supine on the fluoroscopy table. A preprocedural spot fluoroscopic image was obtained of the chest in existing port a catheter. Note was made of difficulty aspirating blood from the port a catheter. Contrast was injected via the Port a catheter and images were reviewed. The needle was  removed the patient tolerated the procedure well without immediate postprocedural complication. FINDINGS: Flipped Port-A-Cath. IMPRESSION: The Port-A-Cath reservoir is flipped. PLAN: Interventional Radiology has arrange for Port-A-Cath revision. Recommend larger reservoir to minimize chances of additional flipping in the future. Marliss Coots, MD [facility] Radiology Electronically Signed   By: Marliss Coots M.D.   On: 07/13/2023 10:50   IR IMAGING GUIDED PORT INSERTION Result Date: 07/04/2023 CLINICAL DATA:  Right breast carcinoma and need for porta cath to begin chemotherapy. EXAM: IMPLANTED PORT A CATH PLACEMENT WITH ULTRASOUND AND FLUOROSCOPIC GUIDANCE ANESTHESIA/SEDATION: Moderate (conscious) sedation was employed during this procedure. A total of Versed 2.0 mg and Fentanyl 100 mcg was administered intravenously. Moderate Sedation Time: 43 minutes. The patient's level of consciousness and vital signs were monitored continuously by radiology nursing throughout the procedure under my direct supervision. FLUOROSCOPY: 1 minute and 24 seconds.  25.0 mGy. PROCEDURE: The procedure, risks, benefits, and alternatives were explained to the patient. Questions regarding the procedure were encouraged and answered. The patient understands and consents to the procedure. A time-out was performed prior to initiating the procedure. Ultrasound was utilized to confirm patency of the left internal jugular vein. An ultrasound image was saved and recorded. The left neck and chest were prepped with chlorhexidine in a sterile fashion, and a sterile drape was applied covering the operative field. Maximum barrier sterile technique with sterile gowns and gloves were used for the procedure. Local anesthesia was provided with 1% lidocaine. After creating a small venotomy incision, a 21 gauge needle was advanced into the left internal jugular vein under direct, real-time ultrasound  guidance. Ultrasound image documentation was performed. After securing guidewire access, an 8 Fr dilator was placed. A J-wire was kinked to measure appropriate catheter length. A subcutaneous port pocket was then created along the upper chest wall utilizing sharp and blunt dissection. Portable cautery was utilized. The pocket was irrigated with sterile saline. A single lumen power injectable port was chosen for placement. The 8 Fr catheter was tunneled from the port pocket site to the venotomy incision. The port was placed in the pocket. External catheter was trimmed to appropriate length based on guidewire measurement. At the venotomy, an 8 Fr peel-away sheath was placed over a guidewire. The catheter was then placed through the sheath and the sheath removed. Final catheter positioning was confirmed and documented with a fluoroscopic spot image. The port was accessed with a needle and aspirated and flushed with heparinized saline. The access needle was removed. The venotomy and port pocket incisions were closed with subcutaneous 3-0 Monocryl and subcuticular 4-0 Vicryl. Dermabond was applied to both incisions. COMPLICATIONS: COMPLICATIONS None FINDINGS: After catheter placement, the tip lies at the cavo-atrial junction. The catheter aspirates normally and is ready for immediate use. IMPRESSION: Placement of single lumen port a cath via left internal jugular vein. The catheter tip lies at the cavo-atrial junction. A  power injectable port a cath was placed and is ready for immediate use. Electronically Signed   By: Irish Lack M.D.   On: 07/04/2023 10:51    CODE STATUS:  Code Status History     Date Active Date Inactive Code Status Order ID Comments User Context   11/13/2015 0949 11/15/2015 1650 Full Code [id number]  Barnetta Chapel, MD Inpatient       No orders of the defined types were placed in this encounter.    Future Appointments  Date Time Provider Department Center  07/27/2023  9:30 AM  [facility] INFUSION [facility] None  07/29/2023 12:00 PM [facility] FLUSH [facility] None  08/03/2023  8:15 AM [facility] PHLEBOTOMIST [facility] None  08/03/2023  8:30 AM [facility] INFUSION [facility] None  08/03/2023  9:00 AM Pollyann Samples, NP [facility] None  08/10/2023  7:45 AM [facility] PHLEBOTOMIST [facility] None  08/10/2023  8:00 AM [facility] INFUSION [facility] None  08/10/2023  8:30 AM Pollyann Samples, NP [facility] None  08/10/2023  9:30 AM [facility] INFUSION [facility] None  08/12/2023 12:00 PM [facility] FLUSH [facility] None  08/22/2023  9:30 AM [facility] INFUSION [facility] None  09/21/2023 11:00 AM Koerner, Cari M [facility] None  09/21/2023 12:00 PM [facility] LAB [facility] None      This document was completed utilizing speech recognition software. Grammatical errors, random word insertions, pronoun errors, and incomplete sentences are an occasional consequence of this system due to software limitations, ambient noise, and hardware issues. Any formal questions or concerns about the content, text or information contained within the body of this dictation should be directly addressed to the provider for clarification.

## 2023-07-25 NOTE — Assessment & Plan Note (Signed)
 She was found to have extensive bruising on the left breast and near left-sided Port-A-Cath on 07/11/2023.  She had fluoroscopic evaluation on 07/13/2023 and the Port-A-Cath reservoir was noted to be flipped.  She underwent Port-A-Cath revision on 07/20/2023.

## 2023-07-25 NOTE — Assessment & Plan Note (Addendum)
 Recently diagnosed with right breast mass (6 cm) and biopsy confirmed malignancy. Estrogen receptor weakly positive at 10%, progesterone receptor negative, and HER2 negative. Left breast has a small mass (6-8 mm) not concerning for malignancy. No lymph node involvement on biopsy but ultrasound was concerning for 3-4 right axillary lymph nodes.   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  -Previously I discussed staging, prognosis, plan of care, treatment options.  Reviewed NCCN guidelines.  On 06/13/2023, staging PET/CT showed intense metabolic activity within the right breast mass consistent with primary breast carcinoma.  Hypermetabolic right axillary and subpectoralis lymph nodes consistent with local nodal metastasis.  No evidence of distant metastatic disease.    Given no evidence of metastatic disease, plan made to treat her with neoadjuvant chemoimmunotherapy using KEYNOTE-522 protocol (carboplatin and paclitaxel plus Keytruda for 4 cycles followed by Adriamycin, Cytoxan and Keytruda for 4 cycles followed by 9 more doses of adjuvant Keytruda).  Echocardiogram on 06/13/2023 showed preserved LV EF.  -She did have an appointment with Dr. Dwain Sarna for surgical consultation on 06/16/2023.    -She went [place] a [place] from 06/21/2023 until 06/26/2023 and deferred treatment initiation until that point.   -She had Port-A-Cath placement on 07/03/2023.  Plan made to proceed with cycle 1, day 1 of carboplatin, paclitaxel and Keytruda on 07/04/2023. She received Keytruda first.  Later she started Taxol and few minutes into infusion, she had severe infusion reaction with chest pain, hypotension, diaphoresis, hypoxia.  EKG was unremarkable.  Blood pressure and O2 sats normalized after additional allergy medication and supplemental oxygen.  We will not rechallenge her with Taxol.  Plan made to switch treatments to Taxotere and cyclophosphamide  regimen, to be given every 3 weeks for up to 6 cycles.  On 07/13/2023, she had infusion reaction to Taxotere as well, although not as severe as she had with Taxol.  She mainly had persistent lower back pain, increased blood pressure and some chest discomfort, which resolved quickly with pausing the infusion and Tylenol.  Plan to switch treatments to dose dense AC, followed by weekly Abraxane.  This was discussed with the patient in detail today and she was agreeable.  She already had education for [facility] regimen.  Tentative start date on 07/27/2023.  Labs today reveal no dose-limiting toxicities.  Will proceed with cycle 1 of AC on 07/27/2023.  We will arrange for toxicity evaluation on 08/03/2023.  Per patient preference, we will arrange for chemotherapy on Thursdays going forward, to accommodate her work schedule.

## 2023-07-25 NOTE — Progress Notes (Signed)
See education note

## 2023-07-25 NOTE — Assessment & Plan Note (Addendum)
 Patient reports discomfort and tenderness in the area of the right breast mass. -Previously provided prescription for Tramadol for pain management as needed, with instructions for potential side effects (drowsiness, constipation) and the use of a stool softener if constipation occurs.  -Pain and discomfort have improved compared to prior.

## 2023-07-25 NOTE — Assessment & Plan Note (Addendum)
 Patient has a history of hypertension and was previously misdiagnosed with congestive heart failure. Currently on Amlodipine once daily, Carvedilol twice daily, irbesartan daily, spironolactone daily, and Furosemide once daily. -Continue current medications. -Emphasize the importance of good blood pressure control, especially in the context of planned chemotherapy.

## 2023-07-26 ENCOUNTER — Other Ambulatory Visit: Payer: Self-pay | Admitting: Nurse Practitioner

## 2023-07-26 ENCOUNTER — Ambulatory Visit: Payer: No Typology Code available for payment source

## 2023-07-26 DIAGNOSIS — C50911 Malignant neoplasm of unspecified site of right female breast: Secondary | ICD-10-CM

## 2023-07-27 ENCOUNTER — Ambulatory Visit: Payer: No Typology Code available for payment source | Admitting: Oncology

## 2023-07-27 ENCOUNTER — Inpatient Hospital Stay: Payer: Medicaid Other

## 2023-07-27 ENCOUNTER — Ambulatory Visit: Payer: No Typology Code available for payment source

## 2023-07-27 ENCOUNTER — Encounter: Payer: Self-pay | Admitting: Oncology

## 2023-07-27 ENCOUNTER — Other Ambulatory Visit: Payer: No Typology Code available for payment source

## 2023-07-27 VITALS — BP 169/99 | HR 79 | Temp 98.2°F | Resp 17

## 2023-07-27 DIAGNOSIS — C50911 Malignant neoplasm of unspecified site of right female breast: Secondary | ICD-10-CM

## 2023-07-27 DIAGNOSIS — Z5111 Encounter for antineoplastic chemotherapy: Secondary | ICD-10-CM | POA: Diagnosis not present

## 2023-07-27 MED ORDER — DIPHENHYDRAMINE HCL 50 MG/ML IJ SOLN
25.0000 mg | Freq: Once | INTRAMUSCULAR | Status: AC
  Administered 2023-07-27: 25 mg via INTRAVENOUS
  Filled 2023-07-27: qty 1

## 2023-07-27 MED ORDER — DEXAMETHASONE SODIUM PHOSPHATE 10 MG/ML IJ SOLN
10.0000 mg | Freq: Once | INTRAMUSCULAR | Status: AC
  Administered 2023-07-27: 10 mg via INTRAVENOUS
  Filled 2023-07-27: qty 1

## 2023-07-27 MED ORDER — SODIUM CHLORIDE 0.9 % IV SOLN
600.0000 mg/m2 | Freq: Once | INTRAVENOUS | Status: AC
  Administered 2023-07-27: 1500 mg via INTRAVENOUS
  Filled 2023-07-27: qty 50

## 2023-07-27 MED ORDER — PALONOSETRON HCL INJECTION 0.25 MG/5ML
0.2500 mg | Freq: Once | INTRAVENOUS | Status: AC
  Administered 2023-07-27: 0.25 mg via INTRAVENOUS
  Filled 2023-07-27: qty 5

## 2023-07-27 MED ORDER — DOXORUBICIN HCL CHEMO IV INJECTION 2 MG/ML
60.0000 mg/m2 | Freq: Once | INTRAVENOUS | Status: AC
  Administered 2023-07-27: 156 mg via INTRAVENOUS
  Filled 2023-07-27: qty 78

## 2023-07-27 MED ORDER — SODIUM CHLORIDE 0.9% FLUSH
10.0000 mL | INTRAVENOUS | Status: DC | PRN
  Administered 2023-07-27: 10 mL

## 2023-07-27 MED ORDER — FAMOTIDINE IN NACL 20-0.9 MG/50ML-% IV SOLN
20.0000 mg | Freq: Once | INTRAVENOUS | Status: AC
  Administered 2023-07-27: 20 mg via INTRAVENOUS
  Filled 2023-07-27: qty 50

## 2023-07-27 MED ORDER — PROCHLORPERAZINE EDISYLATE 10 MG/2ML IJ SOLN
10.0000 mg | Freq: Once | INTRAMUSCULAR | Status: AC
  Administered 2023-07-27: 10 mg via INTRAVENOUS
  Filled 2023-07-27: qty 2

## 2023-07-27 MED ORDER — SODIUM CHLORIDE 0.9 % IV SOLN: INTRAVENOUS | Status: DC

## 2023-07-27 MED ORDER — HEPARIN SOD (PORK) LOCK FLUSH 100 UNIT/ML IV SOLN
500.0000 [IU] | Freq: Once | INTRAVENOUS | Status: AC | PRN
  Administered 2023-07-27: 500 [IU]

## 2023-07-27 NOTE — Patient Instructions (Signed)
 [facility] A DEPT OF [facility]     Discharge Instructions: Thank you for choosing [facility] to provide your oncology and hematology care.   If you have a lab appointment with the Cancer Center, please go directly to the Cancer Center and check in at the registration area.   Wear comfortable clothing and clothing appropriate for easy access to any Portacath or PICC line.   We strive to give you quality time with your provider. You may need to reschedule your appointment if you arrive late (15 or more minutes).  Arriving late affects you and other patients whose appointments are after yours.  Also, if you miss three or more appointments without notifying the office, you may be dismissed from the clinic at the provider's discretion.      For prescription refill requests, have your pharmacy contact our office and allow 72 hours for refills to be completed.    Today you received the following chemotherapy and/or immunotherapy agents CYTOXAN/DOXORUBICIN      To help prevent nausea and vomiting after your treatment, we encourage you to take your nausea medication as directed.  BELOW ARE SYMPTOMS THAT SHOULD BE REPORTED IMMEDIATELY: *FEVER GREATER THAN 100.4 F (38 C) OR HIGHER *CHILLS OR SWEATING *NAUSEA AND VOMITING THAT IS NOT CONTROLLED WITH YOUR NAUSEA MEDICATION *UNUSUAL SHORTNESS OF BREATH *UNUSUAL BRUISING OR BLEEDING *URINARY PROBLEMS (pain or burning when urinating, or frequent urination) *BOWEL PROBLEMS (unusual diarrhea, constipation, pain near the anus) TENDERNESS IN MOUTH AND THROAT WITH OR WITHOUT PRESENCE OF ULCERS (sore throat, sores in mouth, or a toothache) UNUSUAL RASH, SWELLING OR PAIN  UNUSUAL VAGINAL DISCHARGE OR ITCHING   Items with * indicate a potential emergency and should be followed up as soon as possible or go to the Emergency Department if any problems should occur.  Please show the CHEMOTHERAPY ALERT CARD or  IMMUNOTHERAPY ALERT CARD at check-in to the Emergency Department and triage nurse.  Should you have questions after your visit or need to cancel or reschedule your appointment, please contact [facility]  Dept: [phone]  and follow the prompts.  Office hours are 8:00 a.m. to 4:30 p.m. Monday - Friday. Please note that voicemails left after 4:00 p.m. may not be returned until the following business day.  We are closed weekends and major holidays. You have access to a nurse at all times for urgent questions. Please call the main number to the clinic Dept: [phone] and follow the prompts.  Cyclophosphamide Injection What is this medication? CYCLOPHOSPHAMIDE (sye kloe FOSS fa mide) treats some types of cancer. It works by slowing down the growth of cancer cells. This medicine may be used for other purposes; ask your health care provider or pharmacist if you have questions. COMMON BRAND NAME(S): Cyclophosphamide, Cytoxan, Neosar What should I tell my care team before I take this medication? They need to know if you have any of these conditions: Heart disease Irregular heartbeat or rhythm Infection Kidney problems Liver disease Low blood cell levels (white cells, platelets, or red blood cells) Lung disease Previous radiation Trouble passing urine An unusual or allergic reaction to cyclophosphamide, other medications, foods, dyes, or preservatives Pregnant or trying to get pregnant Breast-feeding How should I use this medication? This medication is injected into a vein. It is given by your care team in a hospital or clinic setting. Talk to your care team about  the use of this medication in children. Special care may be needed. Overdosage: If you think you have taken too much of this medicine contact a poison control center or emergency room at once. NOTE: This medicine is only for you. Do not share this medicine with others. What  if I miss a dose? Keep appointments for follow-up doses. It is important not to miss your dose. Call your care team if you are unable to keep an appointment. What may interact with this medication? Amphotericin B Amiodarone Azathioprine Certain antivirals for HIV or hepatitis Certain medications for blood pressure, such as enalapril, lisinopril, quinapril Cyclosporine Diuretics Etanercept Indomethacin Medications that relax muscles Metronidazole Natalizumab Tamoxifen Warfarin This list may not describe all possible interactions. Give your health care provider a list of all the medicines, herbs, non-prescription drugs, or dietary supplements you use. Also tell them if you smoke, drink alcohol, or use illegal drugs. Some items may interact with your medicine. What should I watch for while using this medication? This medication may make you feel generally unwell. This is not uncommon as chemotherapy can affect healthy cells as well as cancer cells. Report any side effects. Continue your course of treatment even though you feel ill unless your care team tells you to stop. You may need blood work while you are taking this medication. This medication may increase your risk of getting an infection. Call your care team for advice if you get a fever, chills, sore throat, or other symptoms of a cold or flu. Do not treat yourself. Try to avoid being around people who are sick. Avoid taking medications that contain aspirin, acetaminophen, ibuprofen, naproxen, or ketoprofen unless instructed by your care team. These medications may hide a fever. Be careful brushing or flossing your teeth or using a toothpick because you may get an infection or bleed more easily. If you have any dental work done, tell your dentist you are receiving this medication. Drink water or other fluids as directed. Urinate often, even at night. Some products may contain alcohol. Ask your care team if this medication contains  alcohol. Be sure to tell all care teams you are taking this medicine. Certain medicines, like metronidazole and disulfiram, can cause an unpleasant reaction when taken with alcohol. The reaction includes flushing, headache, nausea, vomiting, sweating, and increased thirst. The reaction can last from 30 minutes to several hours. Talk to your care team if you wish to become pregnant or think you might be pregnant. This medication can cause serious birth defects if taken during pregnancy and for 1 year after the last dose. A negative pregnancy test is required before starting this medication. A reliable form of contraception is recommended while taking this medication and for 1 year after the last dose. Talk to your care team about reliable forms of contraception. Do not father a child while taking this medication and for 4 months after the last dose. Use a condom during this time period. Do not breast-feed while taking this medication or for 1 week after the last dose. This medication may cause infertility. Talk to your care team if you are concerned about your fertility. Talk to your care team about your risk of cancer. You may be more at risk for certain types of cancer if you take this medication. What side effects may I notice from receiving this medication? Side effects that you should report to your care team as soon as possible: Allergic reactions--skin rash, itching, hives, swelling of the face, lips, tongue,  or throat Dry cough, shortness of breath or trouble breathing Heart failure--shortness of breath, swelling of the ankles, feet, or hands, sudden weight gain, unusual weakness or fatigue Heart muscle inflammation--unusual weakness or fatigue, shortness of breath, chest pain, fast or irregular heartbeat, dizziness, swelling of the ankles, feet, or hands Heart rhythm changes--fast or irregular heartbeat, dizziness, feeling faint or lightheaded, chest pain, trouble breathing Infection--fever,  chills, cough, sore throat, wounds that don't heal, pain or trouble when passing urine, general feeling of discomfort or being unwell Kidney injury--decrease in the amount of urine, swelling of the ankles, hands, or feet Liver injury--right upper belly pain, loss of appetite, nausea, light-colored stool, dark yellow or brown urine, yellowing skin or eyes, unusual weakness or fatigue Low red blood cell level--unusual weakness or fatigue, dizziness, headache, trouble breathing Low sodium level--muscle weakness, fatigue, dizziness, headache, confusion Red or dark brown urine Unusual bruising or bleeding Side effects that usually do not require medical attention (report to your care team if they continue or are bothersome): Hair loss Irregular menstrual cycles or spotting Loss of appetite Nausea Pain, redness, or swelling with sores inside the mouth or throat Vomiting This list may not describe all possible side effects. Call your doctor for medical advice about side effects. You may report side effects to FDA at 1-800-FDA-1088. Where should I keep my medication? This medication is given in a hospital or clinic. It will not be stored at home. NOTE: This sheet is a summary. It may not cover all possible information. If you have questions about this medicine, talk to your doctor, pharmacist, or health care provider.  2024 Elsevier/Gold Standard (2021-10-08 00:00:00) For any non-urgent questions, you may also contact your provider using MyChart. We now offer e-Visits for anyone 30 and older to request care online for non-urgent symptoms. For details visit [email or web address].   Also download the MyChart app! Go to the app store, search "MyChart", open the app, select [place], and log in with your MyChart username and password.  Doxorubicin Injection What is this medication? DOXORUBICIN (dox oh ROO bi sin) treats some types of cancer. It works by slowing down the growth of cancer  cells. This medicine may be used for other purposes; ask your health care provider or pharmacist if you have questions. COMMON BRAND NAME(S): Adriamycin, Adriamycin PFS, Adriamycin RDF, Rubex What should I tell my care team before I take this medication? They need to know if you have any of these conditions: Heart disease History of low blood cell levels caused by a medication Liver disease Recent or ongoing radiation An unusual or allergic reaction to doxorubicin, other medications, foods, dyes, or preservatives If you or your partner are pregnant or trying to get pregnant Breast-feeding How should I use this medication? This medication is injected into a vein. It is given by your care team in a hospital or clinic setting. Talk to your care team about the use of this medication in children. Special care may be needed. Overdosage: If you think you have taken too much of this medicine contact a poison control center or emergency room at once. NOTE: This medicine is only for you. Do not share this medicine with others. What if I miss a dose? Keep appointments for follow-up doses. It is important not to miss your dose. Call your care team if you are unable to keep an appointment. What may interact with this medication? 6-mercaptopurine Paclitaxel Phenytoin St. John's wort Trastuzumab Verapamil This list may not describe all possible  interactions. Give your health care provider a list of all the medicines, herbs, non-prescription drugs, or dietary supplements you use. Also tell them if you smoke, drink alcohol, or use illegal drugs. Some items may interact with your medicine. What should I watch for while using this medication? Your condition will be monitored carefully while you are receiving this medication. You may need blood work while taking this medication. This medication may make you feel generally unwell. This is not uncommon as chemotherapy can affect healthy cells as well as  cancer cells. Report any side effects. Continue your course of treatment even though you feel ill unless your care team tells you to stop. There is a maximum amount of this medication you should receive throughout your life. The amount depends on the medical condition being treated and your overall health. Your care team will watch how much of this medication you receive. Tell your care team if you have taken this medication before. Your urine may turn red for a few days after your dose. This is not blood. If your urine is dark or brown, call your care team. In some cases, you may be given additional medications to help with side effects. Follow all directions for their use. This medication may increase your risk of getting an infection. Call your care team for advice if you get a fever, chills, sore throat, or other symptoms of a cold or flu. Do not treat yourself. Try to avoid being around people who are sick. This medication may increase your risk to bruise or bleed. Call your care team if you notice any unusual bleeding. Talk to your care team about your risk of cancer. You may be more at risk for certain types of cancers if you take this medication. Talk to your care team if you or your partner may be pregnant. Serious birth defects can occur if you take this medication during pregnancy and for 6 months after the last dose. Contraception is recommended while taking this medication and for 6 months after the last dose. Your care team can help you find the option that works for you. If your partner can get pregnant, use a condom while taking this medication and for 6 months after the last dose. Do not breastfeed while taking this medication. This medication may cause infertility. Talk to your care team if you are concerned about your fertility. What side effects may I notice from receiving this medication? Side effects that you should report to your care team as soon as possible: Allergic  reactions--skin rash, itching, hives, swelling of the face, lips, tongue, or throat Heart failure--shortness of breath, swelling of the ankles, feet, or hands, sudden weight gain, unusual weakness or fatigue Heart rhythm changes--fast or irregular heartbeat, dizziness, feeling faint or lightheaded, chest pain, trouble breathing Infection--fever, chills, cough, sore throat, wounds that don't heal, pain or trouble when passing urine, general feeling of discomfort or being unwell Low red blood cell level--unusual weakness or fatigue, dizziness, headache, trouble breathing Painful swelling, warmth, or redness of the skin, blisters or sores at the infusion site Unusual bruising or bleeding Side effects that usually do not require medical attention (report to your care team if they continue or are bothersome): Diarrhea Hair loss Nausea Pain, redness, or swelling with sores inside the mouth or throat Red urine This list may not describe all possible side effects. Call your doctor for medical advice about side effects. You may report side effects to FDA at 1-800-FDA-1088. Where should I  keep my medication? This medication is given in a hospital or clinic. It will not be stored at home. NOTE: This sheet is a summary. It may not cover all possible information. If you have questions about this medicine, talk to your doctor, pharmacist, or health care provider.  2024 Elsevier/Gold Standard (2022-08-25 00:00:00)

## 2023-07-28 ENCOUNTER — Telehealth: Payer: Self-pay

## 2023-07-28 ENCOUNTER — Encounter: Payer: Self-pay | Admitting: Oncology

## 2023-07-28 NOTE — Telephone Encounter (Signed)
 24 Hour Call Back  Telephone call to patient post her first time Adriamycin/Cytoxan infusion. Unable to reach patient. Left a voice message.

## 2023-07-29 ENCOUNTER — Inpatient Hospital Stay: Payer: Medicaid Other

## 2023-07-29 VITALS — BP 182/84 | HR 83 | Temp 98.2°F | Resp 17

## 2023-07-29 DIAGNOSIS — C50911 Malignant neoplasm of unspecified site of right female breast: Secondary | ICD-10-CM

## 2023-07-29 DIAGNOSIS — Z5111 Encounter for antineoplastic chemotherapy: Secondary | ICD-10-CM | POA: Diagnosis not present

## 2023-07-29 MED ORDER — PEGFILGRASTIM-CBQV 6 MG/0.6ML ~~LOC~~ SOSY
6.0000 mg | PREFILLED_SYRINGE | Freq: Once | SUBCUTANEOUS | Status: AC
  Administered 2023-07-29: 6 mg via SUBCUTANEOUS

## 2023-07-29 NOTE — Patient Instructions (Signed)

## 2023-07-29 NOTE — Progress Notes (Signed)
 Pt. Here for Udenyca injection.  Blood pressure elevated.  States she normally takes two blood pressure medications in the morning, but was unable to get them both down due to her nausea.  States she took something for her nausea and feels better now.  Informed Kayla/RN.  She agreed  with me to inform pt. To take her other blood pressure medication when she gets home.  Pt. Advised to monitor her blood pressure once she takes her medication.  Also, advised her to see her PCP or go to ER if blood pressure remains elevated.  Pt. States ok

## 2023-08-01 ENCOUNTER — Ambulatory Visit: Payer: No Typology Code available for payment source | Admitting: Oncology

## 2023-08-01 ENCOUNTER — Other Ambulatory Visit: Payer: No Typology Code available for payment source

## 2023-08-03 ENCOUNTER — Ambulatory Visit: Payer: No Typology Code available for payment source

## 2023-08-03 ENCOUNTER — Encounter: Payer: Self-pay | Admitting: Nurse Practitioner

## 2023-08-03 ENCOUNTER — Inpatient Hospital Stay (HOSPITAL_BASED_OUTPATIENT_CLINIC_OR_DEPARTMENT_OTHER): Payer: Medicaid Other | Admitting: Nurse Practitioner

## 2023-08-03 ENCOUNTER — Inpatient Hospital Stay: Payer: Medicaid Other

## 2023-08-03 VITALS — BP 178/98 | HR 80 | Temp 97.8°F | Resp 16 | Wt 310.1 lb

## 2023-08-03 DIAGNOSIS — C50911 Malignant neoplasm of unspecified site of right female breast: Secondary | ICD-10-CM

## 2023-08-03 DIAGNOSIS — Z5111 Encounter for antineoplastic chemotherapy: Secondary | ICD-10-CM | POA: Diagnosis not present

## 2023-08-03 LAB — CBC WITH DIFFERENTIAL (CANCER CENTER ONLY)
Abs Immature Granulocytes: 0.09 10*3/uL — ABNORMAL HIGH (ref 0.00–0.07)
Basophils Absolute: 0 10*3/uL (ref 0.0–0.1)
Basophils Relative: 1 %
Eosinophils Absolute: 0.1 10*3/uL (ref 0.0–0.5)
Eosinophils Relative: 5 %
HCT: 35.3 % — ABNORMAL LOW (ref 36.0–46.0)
Hemoglobin: 11.3 g/dL — ABNORMAL LOW (ref 12.0–15.0)
Immature Granulocytes: 3 %
Lymphocytes Relative: 21 %
Lymphs Abs: 0.6 10*3/uL — ABNORMAL LOW (ref 0.7–4.0)
MCH: 26.8 pg (ref 26.0–34.0)
MCHC: 32 g/dL (ref 30.0–36.0)
MCV: 83.6 fL (ref 80.0–100.0)
Monocytes Absolute: 0.1 10*3/uL (ref 0.1–1.0)
Monocytes Relative: 2 %
Neutro Abs: 1.9 10*3/uL (ref 1.7–7.7)
Neutrophils Relative %: 68 %
Platelet Count: 211 10*3/uL (ref 150–400)
RBC: 4.22 MIL/uL (ref 3.87–5.11)
RDW: 14.1 % (ref 11.5–15.5)
Smear Review: ADEQUATE
WBC Count: 2.7 10*3/uL — ABNORMAL LOW (ref 4.0–10.5)
nRBC: 0 % (ref 0.0–0.2)

## 2023-08-03 LAB — CMP (CANCER CENTER ONLY)
ALT: 16 U/L (ref 0–44)
AST: 11 U/L — ABNORMAL LOW (ref 15–41)
Albumin: 4 g/dL (ref 3.5–5.0)
Alkaline Phosphatase: 84 U/L (ref 38–126)
Anion gap: 7 (ref 5–15)
BUN: 12 mg/dL (ref 6–20)
CO2: 25 mmol/L (ref 22–32)
Calcium: 8.4 mg/dL — ABNORMAL LOW (ref 8.9–10.3)
Chloride: 103 mmol/L (ref 98–111)
Creatinine: 0.71 mg/dL (ref 0.44–1.00)
GFR, Estimated: >60 mL/min (ref >60)
Glucose, Bld: 123 mg/dL — ABNORMAL HIGH (ref 70–99)
Potassium: 3.9 mmol/L (ref 3.5–5.1)
Sodium: 135 mmol/L (ref 135–145)
Total Bilirubin: 0.6 mg/dL (ref 0.0–1.2)
Total Protein: 7.2 g/dL (ref 6.5–8.1)

## 2023-08-03 LAB — TSH: TSH: 0.594 u[IU]/mL (ref 0.350–4.500)

## 2023-08-03 MED ORDER — ONDANSETRON 8 MG PO TBDP: 8.0000 mg | ORAL_TABLET | Freq: Three times a day (TID) | ORAL | 1 refills | Status: AC | PRN

## 2023-08-03 NOTE — Progress Notes (Signed)
 Patient Care Team: Carolin Coy as PCP - General (Physician Assistant)   CHIEF COMPLAINT: Follow-up inflammatory breast cancer  Oncology History  Inflammatory carcinoma of breast, right (HCC)  06/08/2023 Initial Diagnosis   Inflammatory carcinoma of breast, right (HCC)   06/15/2023 Cancer Staging   Staging form: Breast, AJCC 8th Edition - Clinical: Stage IIIC (cT4b, cN2, cM0, G3, ER+, PR-, HER2-) - Signed by Meryl Crutch, MD on 06/15/2023 Histologic grading system: 3 grade system   07/04/2023 - 07/04/2023 Chemotherapy   Patient is on Treatment Plan : BREAST Pembrolizumab (200) D1 + Carboplatin (5) D1 + Paclitaxel (80) D1,8,15 q21d X 4 cycles / Pembrolizumab (200) D1 + AC D1 q21d x 4 cycles     07/13/2023 - 07/14/2023 Chemotherapy   Patient is on Treatment Plan : BREAST TC q21d     07/27/2023 -  Chemotherapy   Patient is on Treatment Plan : BREAST DOSE DENSE AC q14d / Nab-Paclitaxel (Abraxane) q7d        CURRENT THERAPY: Neoadjuvant chemotherapy: -Pembrolizumab q3 weeks, starting 07/04/23 -Carbo/taxol - d/c due to taxol reaction 07/04/23 -Switched to taxotere/cytoxan - d/c due to taxotere reaction 07/13/23 -Switched to ddAC q2 weeks x4 (starting 07/27/23), to be followed by weekly Abraxane  INTERVAL HISTORY Jasmine Novak returns for follow up toxicity check. Last seen by Dr. Arlana Pouch 07/25/23 and began [facility] 2/20 with Udenyca. She had no issues with bone pain. She was able to take decadron but by day 4 had developed nausea which remains mild. Historically has difficulty swallowing pills and is still having trouble. Tries to take BP med with applesauce but gags and often regurgitates pills. Friends at work try to help by ordering her lunch but she doesn't want it. Has lost weight. Bowels moving. R breast feels less "inflamed" and less painful. R foot feels tight when she takes off compression stocking.     ROS  All other systems reviewed and negative   Past Medical History:  Diagnosis Date    Herpes simplex    states hasn't had outbreak since she was "young"   Hypertension      Past Surgical History:  Procedure Laterality Date   BREAST BIOPSY Right 05/23/2023   [place] RT BREAST BX W LOC DEV 1ST LESION IMG BX SPEC US GUIDE 05/23/2023 [facility] MAMMOGRAPHY   CARPAL TUNNEL RELEASE  1996 & 1999   bilateral wrist   IR CV LINE INJECTION  07/13/2023   IR IMAGING GUIDED PORT INSERTION  07/03/2023   IR IMAGING GUIDED PORT INSERTION  07/20/2023   IR REMOVAL TUN ACCESS W/ PORT W/O FL MOD SED  07/20/2023     Outpatient Encounter Medications as of 08/03/2023  Medication Sig Note   amLODipine (NORVASC) 10 MG tablet Take 1 tablet (10 mg total) by mouth daily.    carvedilol (COREG) 12.5 MG tablet Take 12.5 mg by mouth. 06/19/2023: Taking in the morning then at night- 2x a day   furosemide (LASIX) 40 MG tablet TAKE 1 TABLET(40 MG) BY MOUTH DAILY    irbesartan (AVAPRO) 300 MG tablet Take by mouth.    ondansetron (ZOFRAN-ODT) 8 MG disintegrating tablet Take 1 tablet (8 mg total) by mouth every 8 (eight) hours as needed for nausea or vomiting.    spironolactone (ALDACTONE) 25 MG tablet Take by mouth. 06/19/2023: Taking 4 a day- 100mg    ibuprofen (ADVIL) 200 MG tablet Take 200 mg by mouth every 6 (six) hours as needed. (Patient not taking: Reported on 08/03/2023)  traMADol (ULTRAM) 50 MG tablet Take 1 tablet (50 mg total) by mouth every 6 (six) hours as needed. (Patient not taking: Reported on 08/03/2023)    No facility-administered encounter medications on file as of 08/03/2023.     Today's Vitals   08/03/23 0903 08/03/23 0906 08/03/23 0923  BP: (!) 176/91  (!) 178/98  Pulse: 80    Resp: 16    Temp: 97.8 F (36.6 C)    TempSrc: Temporal    SpO2: 100%    Weight: (!) 310 lb 1.6 oz (140.7 kg)    PainSc:  0-No pain    Body mass index is 50.05 kg/m.   PHYSICAL EXAM GENERAL:alert, no distress and comfortable SKIN: no rash  EYES: sclera clear NECK: without mass LYMPH:  no palpable cervical or  supraclavicular lymphadenopathy  LUNGS: clear with normal breathing effort HEART: regular rate & rhythm, no lower extremity edema ABDOMEN: abdomen soft, non-tender and normal bowel sounds NEURO: alert & oriented x 3 with fluent speech, no focal motor/sensory deficits Breast exam: deferred PAC without erythema    CBC    Component Value Date/Time   WBC 2.7 (L) 08/03/2023 0852   WBC 4.8 07/20/2023 1008   RBC 4.22 08/03/2023 0852   HGB 11.3 (L) 08/03/2023 0852   HGB 11.7 05/23/2018 0929   HCT 35.3 (L) 08/03/2023 0852   HCT 39.2 05/23/2018 0929   PLT 211 08/03/2023 0852   MCV 83.6 08/03/2023 0852   MCV 77 (L) 05/23/2018 0929   MCH 26.8 08/03/2023 0852   MCHC 32.0 08/03/2023 0852   RDW 14.1 08/03/2023 0852   RDW 16.1 (H) 05/23/2018 0929   LYMPHSABS 0.6 (L) 08/03/2023 0852   LYMPHSABS 1.6 05/23/2018 0929   MONOABS 0.1 08/03/2023 0852   EOSABS 0.1 08/03/2023 0852   EOSABS 0.2 05/23/2018 0929   BASOSABS 0.0 08/03/2023 0852   BASOSABS 0.0 05/23/2018 0929     CMP     Component Value Date/Time   NA 135 08/03/2023 0852   NA 140 05/23/2018 0929   K 3.9 08/03/2023 0852   CL 103 08/03/2023 0852   CO2 25 08/03/2023 0852   GLUCOSE 123 (H) 08/03/2023 0852   BUN 12 08/03/2023 0852   BUN 12 05/23/2018 0929   CREATININE 0.71 08/03/2023 0852   CREATININE 1.00 06/27/2016 1355   CALCIUM 8.4 (L) 08/03/2023 0852   PROT 7.2 08/03/2023 0852   PROT 7.8 05/23/2018 0929   ALBUMIN 4.0 08/03/2023 0852   ALBUMIN 4.1 05/23/2018 0929   AST 11 (L) 08/03/2023 0852   ALT 16 08/03/2023 0852   ALKPHOS 84 08/03/2023 0852   BILITOT 0.6 08/03/2023 0852   GFRNONAA >60 08/03/2023 0852   GFRNONAA >89 06/08/2016 0938   GFRAA 84 05/23/2018 0929   GFRAA >89 06/08/2016 0938     ASSESSMENT & PLAN: 53 year old female  Inflammatory carcinoma of the right breast, 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 Hypermetabolic right axillary and subpectoralis lymph nodes consistent with local nodal metastasis. No evidence of distant metastatic disease.  -Echo 06/13/23 showed preserved EF -Began neoadjuvant chemo per [id number] protocol, unfortunately experienced Taxol and Taxotere reactions and is now currently on ddAC q. 2 weeks x 4 with G-CSF to be followed by weekly Abraxane, and has received 1 dose of pembrolizumab -Jasmine Novak appears stable.  S/p cycle 1 AC, tolerated well overall but is experiencing delayed nausea.  She  has difficulty taking pills at baseline.  Will try Zofran ODT, then she will attempt other meds an hour later, and add Compazine as needed.  -She previously reacted to Emend, will add Ativan as needed for refractory nausea/vomiting if needed -Vital signs and labs are stable, she does not appear to need IV fluids or other supportive care today -Clinically she feels the right breast mass is responding to treatment, I will examine her next week with cycle 2 -She knows to call if the supportive plan is not helpful.  -Follow-up next week with cycle 2 AC and Pembro   PLAN: -Labs reviewed -Rx: Zofran ODT 8 mg po q8h -She previously reacted to Emend, may give ativan for refractory n/v  -F/up and C2 AC and pembro next week as scheduled    All questions were answered. The patient knows to call the clinic with any problems, questions or concerns. No barriers to learning were detected. I spent 20 minutes counseling the patient face to face. The total time spent in the appointment was 30 minutes and more than 50% was on counseling, review of test results, and coordination of care.   Jasmine Glad, NP-C 08/03/2023

## 2023-08-04 ENCOUNTER — Other Ambulatory Visit: Payer: Self-pay | Admitting: Oncology

## 2023-08-04 DIAGNOSIS — K1231 Oral mucositis (ulcerative) due to antineoplastic therapy: Secondary | ICD-10-CM

## 2023-08-04 LAB — T4: T4, Total: 8 ug/dL (ref 4.5–12.0)

## 2023-08-04 MED ORDER — NYSTATIN 100000 UNIT/ML MT SUSP: 5.0000 mL | Freq: Three times a day (TID) | OROMUCOSAL | 1 refills | Status: AC | PRN

## 2023-08-05 ENCOUNTER — Ambulatory Visit: Payer: No Typology Code available for payment source

## 2023-08-08 ENCOUNTER — Other Ambulatory Visit: Payer: No Typology Code available for payment source

## 2023-08-08 ENCOUNTER — Ambulatory Visit: Payer: No Typology Code available for payment source | Admitting: Oncology

## 2023-08-09 ENCOUNTER — Encounter: Payer: Self-pay | Admitting: Nutrition

## 2023-08-09 NOTE — Progress Notes (Unsigned)
 Patient Care Team: Jasmine Novak as PCP - General (Physician Assistant)   CHIEF COMPLAINT: Follow up inflammatory breast cancer   Oncology History  Inflammatory carcinoma of breast, right (HCC)  06/08/2023 Initial Diagnosis   Inflammatory carcinoma of breast, right (HCC)   06/15/2023 Cancer Staging   Staging form: Breast, AJCC 8th Edition - Clinical: Stage IIIC (cT4b, cN2, cM0, G3, ER+, PR-, HER2-) - Signed by Meryl Crutch, MD on 06/15/2023 Histologic grading system: 3 grade system   07/04/2023 - 07/04/2023 Chemotherapy   Patient is on Treatment Plan : BREAST Pembrolizumab (200) D1 + Carboplatin (5) D1 + Paclitaxel (80) D1,8,15 q21d X 4 cycles / Pembrolizumab (200) D1 + AC D1 q21d x 4 cycles     07/13/2023 - 07/14/2023 Chemotherapy   Patient is on Treatment Plan : BREAST TC q21d     07/27/2023 -  Chemotherapy   Patient is on Treatment Plan : BREAST DOSE DENSE AC q14d / Nab-Paclitaxel (Abraxane) q7d        CURRENT THERAPY:  Neoadjuvant chemotherapy: -Pembrolizumab q3 weeks, starting 07/04/23 -Carbo/taxol - d/c due to taxol reaction 07/04/23 -Switched to taxotere/cytoxan - d/c due to taxotere reaction 07/13/23 -Switched to ddAC q2 weeks x4 (starting 07/27/23), to be followed by weekly Abraxane  INTERVAL HISTORY Jasmine Novak returns for follow up and C2 AC. Last seen by me 2/27 for toxicity check. She was doing well except difficulty/gagging on medication and weight loss.   ROS   Past Medical History:  Diagnosis Date   Herpes simplex    states hasn't had outbreak since she was "young"   Hypertension      Past Surgical History:  Procedure Laterality Date   BREAST BIOPSY Right 05/23/2023   [place] RT BREAST BX W LOC DEV 1ST LESION IMG BX SPEC US GUIDE 05/23/2023 [facility]   CARPAL TUNNEL RELEASE  1996 & 1999   bilateral wrist   IR CV LINE INJECTION  07/13/2023   IR IMAGING GUIDED PORT INSERTION  07/03/2023   IR IMAGING GUIDED PORT INSERTION  07/20/2023   IR REMOVAL TUN  ACCESS W/ PORT W/O FL MOD SED  07/20/2023     Outpatient Encounter Medications as of 08/10/2023  Medication Sig Note   amLODipine (NORVASC) 10 MG tablet Take 1 tablet (10 mg total) by mouth daily.    carvedilol (COREG) 12.5 MG tablet Take 12.5 mg by mouth. 06/19/2023: Taking in the morning then at night- 2x a day   furosemide (LASIX) 40 MG tablet TAKE 1 TABLET(40 MG) BY MOUTH DAILY    ibuprofen (ADVIL) 200 MG tablet Take 200 mg by mouth every 6 (six) hours as needed. (Patient not taking: Reported on 08/03/2023)    irbesartan (AVAPRO) 300 MG tablet Take by mouth.    magic mouthwash (nystatin, hydrocortisone, diphenhydrAMINE, lidocaine) suspension Swish and swallow 5 mLs 3 (three) times daily as needed for mouth pain.    ondansetron (ZOFRAN-ODT) 8 MG disintegrating tablet Take 1 tablet (8 mg total) by mouth every 8 (eight) hours as needed for nausea or vomiting.    spironolactone (ALDACTONE) 25 MG tablet Take by mouth. 06/19/2023: Taking 4 a day- 100mg    traMADol (ULTRAM) 50 MG tablet Take 1 tablet (50 mg total) by mouth every 6 (six) hours as needed. (Patient not taking: Reported on 08/03/2023)    No facility-administered encounter medications on file as of 08/10/2023.     There were no vitals filed for this visit. There is no height or weight on  file to calculate BMI.   PHYSICAL EXAM GENERAL:alert, no distress and comfortable SKIN: no rash  EYES: sclera clear NECK: without mass LYMPH:  no palpable cervical or supraclavicular lymphadenopathy  LUNGS: clear with normal breathing effort HEART: regular rate & rhythm, no lower extremity edema ABDOMEN: abdomen soft, non-tender and normal bowel sounds NEURO: alert & oriented x 3 with fluent speech, no focal motor/sensory deficits Breast exam:  PAC without erythema    CBC    Component Value Date/Time   WBC 2.7 (L) 08/03/2023 0852   WBC 4.8 07/20/2023 1008   RBC 4.22 08/03/2023 0852   HGB 11.3 (L) 08/03/2023 0852   HGB 11.7 05/23/2018 0929    HCT 35.3 (L) 08/03/2023 0852   HCT 39.2 05/23/2018 0929   PLT 211 08/03/2023 0852   MCV 83.6 08/03/2023 0852   MCV 77 (L) 05/23/2018 0929   MCH 26.8 08/03/2023 0852   MCHC 32.0 08/03/2023 0852   RDW 14.1 08/03/2023 0852   RDW 16.1 (H) 05/23/2018 0929   LYMPHSABS 0.6 (L) 08/03/2023 0852   LYMPHSABS 1.6 05/23/2018 0929   MONOABS 0.1 08/03/2023 0852   EOSABS 0.1 08/03/2023 0852   EOSABS 0.2 05/23/2018 0929   BASOSABS 0.0 08/03/2023 0852   BASOSABS 0.0 05/23/2018 0929     CMP     Component Value Date/Time   NA 135 08/03/2023 0852   NA 140 05/23/2018 0929   K 3.9 08/03/2023 0852   CL 103 08/03/2023 0852   CO2 25 08/03/2023 0852   GLUCOSE 123 (H) 08/03/2023 0852   BUN 12 08/03/2023 0852   BUN 12 05/23/2018 0929   CREATININE 0.71 08/03/2023 0852   CREATININE 1.00 06/27/2016 1355   CALCIUM 8.4 (L) 08/03/2023 0852   PROT 7.2 08/03/2023 0852   PROT 7.8 05/23/2018 0929   ALBUMIN 4.0 08/03/2023 0852   ALBUMIN 4.1 05/23/2018 0929   AST 11 (L) 08/03/2023 0852   ALT 16 08/03/2023 0852   ALKPHOS 84 08/03/2023 0852   BILITOT 0.6 08/03/2023 0852   GFRNONAA >60 08/03/2023 0852   GFRNONAA >89 06/08/2016 0938   GFRAA 84 05/23/2018 0929   GFRAA >89 06/08/2016 0938     ASSESSMENT & PLAN:53 year old female   Inflammatory carcinoma of the right breast, 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 Hypermetabolic right axillary and subpectoralis lymph nodes consistent with local nodal metastasis. No evidence of distant metastatic disease.  -Echo 06/13/23 showed preserved EF -Began neoadjuvant chemo per keynote-522 protocol, unfortunately experienced Taxol and Taxotere reactions and is now currently on ddAC q. 2 weeks x 4 with G-CSF to be followed by weekly Abraxane, and has received 1 dose of pembrolizumab   PLAN:  No orders of the defined types  were placed in this encounter.     All questions were answered. The patient knows to call the clinic with any problems, questions or concerns. No barriers to learning were detected. I spent *** counseling the patient face to face. The total time spent in the appointment was *** and more than 50% was on counseling, review of test results, and coordination of care.   Santiago Glad, NP-C @DATE @

## 2023-08-10 ENCOUNTER — Encounter: Payer: Self-pay | Admitting: Nurse Practitioner

## 2023-08-10 ENCOUNTER — Inpatient Hospital Stay: Payer: No Typology Code available for payment source | Attending: Oncology

## 2023-08-10 ENCOUNTER — Inpatient Hospital Stay (HOSPITAL_BASED_OUTPATIENT_CLINIC_OR_DEPARTMENT_OTHER): Payer: No Typology Code available for payment source | Admitting: Nurse Practitioner

## 2023-08-10 ENCOUNTER — Telehealth: Payer: Self-pay

## 2023-08-10 ENCOUNTER — Inpatient Hospital Stay: Payer: No Typology Code available for payment source

## 2023-08-10 ENCOUNTER — Telehealth: Payer: Self-pay | Admitting: *Deleted

## 2023-08-10 ENCOUNTER — Ambulatory Visit: Payer: No Typology Code available for payment source

## 2023-08-10 VITALS — BP 160/84 | HR 90 | Temp 97.8°F | Resp 18 | Ht 66.0 in | Wt 314.2 lb

## 2023-08-10 DIAGNOSIS — C50411 Malignant neoplasm of upper-outer quadrant of right female breast: Secondary | ICD-10-CM | POA: Diagnosis present

## 2023-08-10 DIAGNOSIS — C50911 Malignant neoplasm of unspecified site of right female breast: Secondary | ICD-10-CM

## 2023-08-10 DIAGNOSIS — R112 Nausea with vomiting, unspecified: Secondary | ICD-10-CM | POA: Insufficient documentation

## 2023-08-10 DIAGNOSIS — Z452 Encounter for adjustment and management of vascular access device: Secondary | ICD-10-CM | POA: Insufficient documentation

## 2023-08-10 DIAGNOSIS — D709 Neutropenia, unspecified: Secondary | ICD-10-CM | POA: Diagnosis not present

## 2023-08-10 DIAGNOSIS — Z170 Estrogen receptor positive status [ER+]: Secondary | ICD-10-CM | POA: Diagnosis not present

## 2023-08-10 DIAGNOSIS — Z5189 Encounter for other specified aftercare: Secondary | ICD-10-CM | POA: Insufficient documentation

## 2023-08-10 DIAGNOSIS — I10 Essential (primary) hypertension: Secondary | ICD-10-CM | POA: Diagnosis not present

## 2023-08-10 DIAGNOSIS — J309 Allergic rhinitis, unspecified: Secondary | ICD-10-CM | POA: Diagnosis not present

## 2023-08-10 DIAGNOSIS — Z5111 Encounter for antineoplastic chemotherapy: Secondary | ICD-10-CM | POA: Insufficient documentation

## 2023-08-10 LAB — CMP (CANCER CENTER ONLY)
ALT: 18 U/L (ref 0–44)
AST: 14 U/L — ABNORMAL LOW (ref 15–41)
Albumin: 3.7 g/dL (ref 3.5–5.0)
Alkaline Phosphatase: 80 U/L (ref 38–126)
Anion gap: 8 (ref 5–15)
BUN: 9 mg/dL (ref 6–20)
CO2: 25 mmol/L (ref 22–32)
Calcium: 8.1 mg/dL — ABNORMAL LOW (ref 8.9–10.3)
Chloride: 107 mmol/L (ref 98–111)
Creatinine: 0.88 mg/dL (ref 0.44–1.00)
GFR, Estimated: >60 mL/min (ref >60)
Glucose, Bld: 159 mg/dL — ABNORMAL HIGH (ref 70–99)
Potassium: 3.4 mmol/L — ABNORMAL LOW (ref 3.5–5.1)
Sodium: 140 mmol/L (ref 135–145)
Total Bilirubin: 0.4 mg/dL (ref 0.0–1.2)
Total Protein: 7 g/dL (ref 6.5–8.1)

## 2023-08-10 LAB — CBC WITH DIFFERENTIAL (CANCER CENTER ONLY)
Abs Immature Granulocytes: 0.1 10*3/uL — ABNORMAL HIGH (ref 0.00–0.07)
Basophils Absolute: 0 10*3/uL (ref 0.0–0.1)
Basophils Relative: 2 %
Eosinophils Absolute: 0 10*3/uL (ref 0.0–0.5)
Eosinophils Relative: 1 %
HCT: 33.4 % — ABNORMAL LOW (ref 36.0–46.0)
Hemoglobin: 10.7 g/dL — ABNORMAL LOW (ref 12.0–15.0)
Immature Granulocytes: 5 %
Lymphocytes Relative: 33 %
Lymphs Abs: 0.7 10*3/uL (ref 0.7–4.0)
MCH: 27.2 pg (ref 26.0–34.0)
MCHC: 32 g/dL (ref 30.0–36.0)
MCV: 84.8 fL (ref 80.0–100.0)
Monocytes Absolute: 0.5 10*3/uL (ref 0.1–1.0)
Monocytes Relative: 25 %
Neutro Abs: 0.7 10*3/uL — ABNORMAL LOW (ref 1.7–7.7)
Neutrophils Relative %: 34 %
Platelet Count: 180 10*3/uL (ref 150–400)
RBC: 3.94 MIL/uL (ref 3.87–5.11)
RDW: 14.5 % (ref 11.5–15.5)
WBC Count: 2 10*3/uL — ABNORMAL LOW (ref 4.0–10.5)
nRBC: 0 % (ref 0.0–0.2)

## 2023-08-10 MED ORDER — LORATADINE 10 MG PO TBDP: 10.0000 mg | ORAL_TABLET | Freq: Every day | ORAL | 1 refills | Status: AC

## 2023-08-10 MED ORDER — LOPERAMIDE HCL 2 MG PO CAPS
2.0000 mg | ORAL_CAPSULE | ORAL | 1 refills | Status: AC | PRN
Start: 2023-08-10 — End: ?

## 2023-08-10 MED ORDER — PROCHLORPERAZINE 25 MG RE SUPP: 25.0000 mg | Freq: Two times a day (BID) | RECTAL | 1 refills | Status: DC | PRN

## 2023-08-10 MED ORDER — SODIUM CHLORIDE 0.9% FLUSH
10.0000 mL | INTRAVENOUS | Status: DC | PRN
  Administered 2023-08-10: 10 mL via INTRAVENOUS

## 2023-08-10 MED ORDER — HEPARIN SOD (PORK) LOCK FLUSH 100 UNIT/ML IV SOLN
500.0000 [IU] | Freq: Once | INTRAVENOUS | Status: AC
Start: 2023-08-10 — End: 2023-08-10
  Administered 2023-08-10: 500 [IU] via INTRAVENOUS

## 2023-08-10 NOTE — Telephone Encounter (Signed)
 error

## 2023-08-10 NOTE — Telephone Encounter (Signed)
 Called to report that NP wrote note to keep her out of work this week. Asking if she can return to work on Monday, 3/11? Left message for NP to respond.

## 2023-08-10 NOTE — Progress Notes (Signed)
 Patient seen by Santiago Glad, NP today  Vitals are within treatment parameters:Yes  BP: 160/84, increased 4 pounds Labs are within treatment parameters: No (Please specify and give further instructions.)  ANC- 0.7, AST- 14, WBC- 2.0, Potassium-3.4 Treatment plan has been signed: Yes   Per physician team, Patient will not be receiving treatment today.

## 2023-08-12 ENCOUNTER — Inpatient Hospital Stay: Payer: No Typology Code available for payment source

## 2023-08-14 ENCOUNTER — Ambulatory Visit: Payer: No Typology Code available for payment source

## 2023-08-15 ENCOUNTER — Ambulatory Visit: Payer: No Typology Code available for payment source

## 2023-08-15 ENCOUNTER — Ambulatory Visit: Payer: No Typology Code available for payment source | Admitting: Oncology

## 2023-08-15 ENCOUNTER — Other Ambulatory Visit: Payer: No Typology Code available for payment source

## 2023-08-16 ENCOUNTER — Ambulatory Visit: Payer: No Typology Code available for payment source

## 2023-08-16 NOTE — Progress Notes (Unsigned)
 Patient Care Team: Carolin Coy as PCP - General (Physician Assistant)   CHIEF COMPLAINT: Follow up inflammatory breast cancer   Oncology History  Inflammatory carcinoma of breast, right (HCC)  06/08/2023 Initial Diagnosis   Inflammatory carcinoma of breast, right (HCC)   06/15/2023 Cancer Staging   Staging form: Breast, AJCC 8th Edition - Clinical: Stage IIIC (cT4b, cN2, cM0, G3, ER+, PR-, HER2-) - Signed by Meryl Crutch, MD on 06/15/2023 Histologic grading system: 3 grade system   07/04/2023 - 07/04/2023 Chemotherapy   Patient is on Treatment Plan : BREAST Pembrolizumab (200) D1 + Carboplatin (5) D1 + Paclitaxel (80) D1,8,15 q21d X 4 cycles / Pembrolizumab (200) D1 + AC D1 q21d x 4 cycles     07/13/2023 - 07/14/2023 Chemotherapy   Patient is on Treatment Plan : BREAST TC q21d     07/27/2023 -  Chemotherapy   Patient is on Treatment Plan : BREAST DOSE DENSE AC q14d / Nab-Paclitaxel (Abraxane) q7d        CURRENT THERAPY:  Neoadjuvant chemotherapy: -Pembrolizumab q3 weeks, starting 07/04/23 -Carbo/taxol - d/c due to taxol reaction 07/04/23 -Switched to taxotere/cytoxan - d/c due to taxotere reaction 07/13/23 -Switched to ddAC q2 weeks x4 (starting 07/27/23), to be followed by weekly Abraxane  INTERVAL HISTORY [name] returns for follow up as scheduled. Last seen by me 08/10/23 with cycle 2 which was postponed due to neutropenia (despite gcsf). She feels well today, tired since she went back to work. Eating/drinking well and keeping meds down. Bowels moving daily. Mouth is dry, has not picked up magic mouthwash yet. Compazine suppository was too expensive. R breast continues to feel lighter.   ROS  All other systems reviewed and negative  Past Medical History:  Diagnosis Date   Herpes simplex    states hasn't had outbreak since she was "young"   Hypertension      Past Surgical History:  Procedure Laterality Date   BREAST BIOPSY Right 05/23/2023   [place] RT BREAST BX W LOC  DEV 1ST LESION IMG BX SPEC US GUIDE 05/23/2023 [facility]MO[facility]   CARPAL TUNNEL RELEASE  1996 & 1999   bilateral wrist   IR CV LINE INJECTION  07/13/2023   IR IMAGING GUIDED PORT INSERTION  07/03/2023   IR IMAGING GUIDED PORT INSERTION  07/20/2023   IR REMOVAL TUN ACCESS W/ PORT W/O FL MOD SED  07/20/2023     Outpatient Encounter Medications as of 08/17/2023  Medication Sig Note   amLODipine (NORVASC) 10 MG tablet Take 1 tablet (10 mg total) by mouth daily.    carvedilol (COREG) 12.5 MG tablet Take 12.5 mg by mouth. 06/19/2023: Taking in the morning then at night- 2x a day   furosemide (LASIX) 40 MG tablet TAKE 1 TABLET(40 MG) BY MOUTH DAILY    irbesartan (AVAPRO) 300 MG tablet Take by mouth.    loratadine (CLARITIN REDITABS) 10 MG dissolvable tablet Take 1 tablet (10 mg total) by mouth daily.    spironolactone (ALDACTONE) 25 MG tablet Take by mouth. 06/19/2023: Taking 4 a day- 100mg    ibuprofen (ADVIL) 200 MG tablet Take 200 mg by mouth every 6 (six) hours as needed. (Patient not taking: Reported on 08/03/2023)    loperamide (IMODIUM) 2 MG capsule Take 1-2 capsules (2-4 mg total) by mouth as needed for diarrhea or loose stools. (Patient not taking: Reported on 08/17/2023)    magic mouthwash (nystatin, hydrocortisone, diphenhydrAMINE, lidocaine) suspension Swish and swallow 5 mLs 3 (three) times daily  as needed for mouth pain. (Patient not taking: Reported on 08/17/2023)    ondansetron (ZOFRAN-ODT) 8 MG disintegrating tablet Take 1 tablet (8 mg total) by mouth every 8 (eight) hours as needed for nausea or vomiting. (Patient not taking: Reported on 08/17/2023)    prochlorperazine (COMPAZINE) 25 MG suppository Place 1 suppository (25 mg total) rectally every 12 (twelve) hours as needed for nausea or vomiting. (Patient not taking: Reported on 08/17/2023)    traMADol (ULTRAM) 50 MG tablet Take 1 tablet (50 mg total) by mouth every 6 (six) hours as needed. (Patient not taking: Reported on 08/03/2023)    No  facility-administered encounter medications on file as of 08/17/2023.     Today's Vitals   08/17/23 0913 08/17/23 0919  BP:  (!) 163/86  Pulse:  84  Resp:  18  Temp:  97.7 F (36.5 C)  TempSrc:  Temporal  SpO2:  99%  Weight:  (!) 314 lb 9.6 oz (142.7 kg)  PainSc: 0-No pain    Body mass index is 50.78 kg/m.   PHYSICAL EXAM GENERAL:alert, no distress and comfortable SKIN: no rash  HEENT: sclera clear. No thrush or ulcers LUNGS: clear with normal breathing effort HEART: regular rate & rhythm, R>L lower extremity edema ABDOMEN: abdomen soft, non-tender and normal bowel sounds NEURO: alert & oriented x 3 with fluent speech, no focal motor/sensory deficits Breast exam: deferred PAC without erythema    CBC    Component Value Date/Time   WBC 6.9 08/17/2023 0859   WBC 4.8 07/20/2023 1008   RBC 4.04 08/17/2023 0859   HGB 10.8 (L) 08/17/2023 0859   HGB 11.7 05/23/2018 0929   HCT 34.0 (L) 08/17/2023 0859   HCT 39.2 05/23/2018 0929   PLT 279 08/17/2023 0859   MCV 84.2 08/17/2023 0859   MCV 77 (L) 05/23/2018 0929   MCH 26.7 08/17/2023 0859   MCHC 31.8 08/17/2023 0859   RDW 14.7 08/17/2023 0859   RDW 16.1 (H) 05/23/2018 0929   LYMPHSABS 0.9 08/17/2023 0859   LYMPHSABS 1.6 05/23/2018 0929   MONOABS 1.0 08/17/2023 0859   EOSABS 0.1 08/17/2023 0859   EOSABS 0.2 05/23/2018 0929   BASOSABS 0.1 08/17/2023 0859   BASOSABS 0.0 05/23/2018 0929     CMP     Component Value Date/Time   NA 139 08/17/2023 0859   NA 140 05/23/2018 0929   K 3.8 08/17/2023 0859   CL 105 08/17/2023 0859   CO2 26 08/17/2023 0859   GLUCOSE 134 (H) 08/17/2023 0859   BUN 11 08/17/2023 0859   BUN 12 05/23/2018 0929   CREATININE 0.90 08/17/2023 0859   CREATININE 1.00 06/27/2016 1355   CALCIUM 8.4 (L) 08/17/2023 0859   PROT 7.0 08/17/2023 0859   PROT 7.8 05/23/2018 0929   ALBUMIN 3.8 08/17/2023 0859   ALBUMIN 4.1 05/23/2018 0929   AST 17 08/17/2023 0859   ALT 23 08/17/2023 0859   ALKPHOS 84  08/17/2023 0859   BILITOT 0.3 08/17/2023 0859   GFRNONAA >60 08/17/2023 0859   GFRNONAA >89 06/08/2016 0938   GFRAA 84 05/23/2018 0929   GFRAA >89 06/08/2016 0938     ASSESSMENT & PLAN: 53 year old female   Inflammatory carcinoma of the right breast, 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 Hypermetabolic right axillary and subpectoralis lymph nodes consistent with local nodal metastasis. No evidence of distant metastatic disease.  -Echo 06/13/23 showed preserved EF -  Began neoadjuvant chemo per keynote-522 protocol, unfortunately experienced Taxol and Taxotere reactions and is now currently on ddAC q. 2 weeks x 4 with G-CSF to be followed by weekly Abraxane, and has received 1 dose of pembrolizumab (on hold during the course of AC per Dr. Arlana Pouch) -S/p C1 AC with GCSF. She tolerated well except nausea and difficulty with meds and weight loss. Symptoms improved with dissolvable zofran. She gained some weight back. I prescribed compazine suppository if she can't tolerate oral meds in the future  -Unfortunately she is neutropenic despite G-CSF on cycle 2-day 1 and treatment was postponed to today.  She has recovered well and is ready to proceed with treatment at a 20% dose reduction and continue G-CSF on day 3 -The goal is to get her back to full dose if her counts allow -Follow-up and cycle 3 in 2 weeks  Nausea/vomiting and difficulty with medication  -She had difficulty swallowing pills prior to chemo, which worsened after cycle 1 AC, she could not keep down antiemetics and lost weight -She is able to tolerate Dex after treatment, symptoms improved with Zofran ODT -I changed Claritin to ODT and she will pick up Compazine suppository if needed, however the co-pay is high.  Will ask social work for assistance  Bilateral leg edema -Her baseline is L>R,  however for the past 3 days her right leg has been more swollen, feels heavy -She was up on her feet at work.  Edema improves with elevation overnight but does not resolve -Pretreatment LVEF 55-60%.  Protein/albumin are normal -Will proceed with bilateral lower extremity Doppler for further evaluation    PLAN: -Labs reviewed -Proceed with C2 AC with 20% dose reduction for this cycle, GCSF on day 3 (goal is to increase back to full dose if counts allow) -Bilateral doppler 2:30 today for R>L lower extremity edema  -Spoke to [facility] pharmacy about Rx and co-pays; referral to SW for assistance -F/up and C3 in 2 weeks as scheduled, continue symptom management for chemo SEs. Call sooner if needed    Orders Placed This Encounter  Procedures   US Venous Img Lower Bilateral (DVT)    Standing Status:   Future    Expected Date:   08/18/2023    Expiration Date:   08/16/2024    Reason for Exam (SYMPTOM  OR DIAGNOSIS REQUIRED):   R>L leg edema, breast cancer on chemo, r/o DVT    Preferred imaging location?:   [facility]   Ambulatory referral to Social Work    Referral Priority:   Routine    Referral Type:   Consultation    Referral Reason:   Specialty Services Required    Referred to Provider:   Marguerita Merles, LCSW    Number of Visits Requested:   1      All questions were answered. The patient knows to call the clinic with any problems, questions or concerns. No barriers to learning were detected. I spent 20 minutes counseling the patient face to face. The total time spent in the appointment was 30 minutes and more than 50% was on counseling, review of test results, and coordination of care.   Santiago Glad, NP-C 08/17/2023

## 2023-08-17 ENCOUNTER — Encounter: Payer: Self-pay | Admitting: Oncology

## 2023-08-17 ENCOUNTER — Inpatient Hospital Stay

## 2023-08-17 ENCOUNTER — Encounter: Payer: Self-pay | Admitting: Nurse Practitioner

## 2023-08-17 ENCOUNTER — Other Ambulatory Visit (HOSPITAL_BASED_OUTPATIENT_CLINIC_OR_DEPARTMENT_OTHER): Payer: Self-pay

## 2023-08-17 ENCOUNTER — Ambulatory Visit: Payer: No Typology Code available for payment source

## 2023-08-17 ENCOUNTER — Other Ambulatory Visit: Payer: Self-pay | Admitting: Oncology

## 2023-08-17 ENCOUNTER — Inpatient Hospital Stay (HOSPITAL_BASED_OUTPATIENT_CLINIC_OR_DEPARTMENT_OTHER): Admitting: Nurse Practitioner

## 2023-08-17 ENCOUNTER — Other Ambulatory Visit: Payer: Self-pay

## 2023-08-17 ENCOUNTER — Ambulatory Visit (HOSPITAL_BASED_OUTPATIENT_CLINIC_OR_DEPARTMENT_OTHER)
Admission: RE | Admit: 2023-08-17 | Discharge: 2023-08-17 | Disposition: A | Discharge status: Home / Self Care | Source: Ambulatory Visit | Attending: Nurse Practitioner | Admitting: Nurse Practitioner

## 2023-08-17 VITALS — BP 173/77 | HR 78 | Resp 18

## 2023-08-17 VITALS — BP 163/86 | HR 84 | Temp 97.7°F | Resp 18 | Wt 314.6 lb

## 2023-08-17 DIAGNOSIS — C50911 Malignant neoplasm of unspecified site of right female breast: Secondary | ICD-10-CM | POA: Diagnosis present

## 2023-08-17 DIAGNOSIS — R600 Localized edema: Secondary | ICD-10-CM

## 2023-08-17 DIAGNOSIS — Z5111 Encounter for antineoplastic chemotherapy: Secondary | ICD-10-CM | POA: Diagnosis not present

## 2023-08-17 LAB — CBC WITH DIFFERENTIAL (CANCER CENTER ONLY)
Abs Immature Granulocytes: 0.29 10*3/uL — ABNORMAL HIGH (ref 0.00–0.07)
Basophils Absolute: 0.1 10*3/uL (ref 0.0–0.1)
Basophils Relative: 1 %
Eosinophils Absolute: 0.1 10*3/uL (ref 0.0–0.5)
Eosinophils Relative: 1 %
HCT: 34 % — ABNORMAL LOW (ref 36.0–46.0)
Hemoglobin: 10.8 g/dL — ABNORMAL LOW (ref 12.0–15.0)
Immature Granulocytes: 4 %
Lymphocytes Relative: 13 %
Lymphs Abs: 0.9 10*3/uL (ref 0.7–4.0)
MCH: 26.7 pg (ref 26.0–34.0)
MCHC: 31.8 g/dL (ref 30.0–36.0)
MCV: 84.2 fL (ref 80.0–100.0)
Monocytes Absolute: 1 10*3/uL (ref 0.1–1.0)
Monocytes Relative: 14 %
Neutro Abs: 4.6 10*3/uL (ref 1.7–7.7)
Neutrophils Relative %: 67 %
Platelet Count: 279 10*3/uL (ref 150–400)
RBC: 4.04 MIL/uL (ref 3.87–5.11)
RDW: 14.7 % (ref 11.5–15.5)
WBC Count: 6.9 10*3/uL (ref 4.0–10.5)
nRBC: 0 % (ref 0.0–0.2)

## 2023-08-17 LAB — CMP (CANCER CENTER ONLY)
ALT: 23 U/L (ref 0–44)
AST: 17 U/L (ref 15–41)
Albumin: 3.8 g/dL (ref 3.5–5.0)
Alkaline Phosphatase: 84 U/L (ref 38–126)
Anion gap: 8 (ref 5–15)
BUN: 11 mg/dL (ref 6–20)
CO2: 26 mmol/L (ref 22–32)
Calcium: 8.4 mg/dL — ABNORMAL LOW (ref 8.9–10.3)
Chloride: 105 mmol/L (ref 98–111)
Creatinine: 0.9 mg/dL (ref 0.44–1.00)
GFR, Estimated: >60 mL/min (ref >60)
Glucose, Bld: 134 mg/dL — ABNORMAL HIGH (ref 70–99)
Potassium: 3.8 mmol/L (ref 3.5–5.1)
Sodium: 139 mmol/L (ref 135–145)
Total Bilirubin: 0.3 mg/dL (ref 0.0–1.2)
Total Protein: 7 g/dL (ref 6.5–8.1)

## 2023-08-17 MED ORDER — SODIUM CHLORIDE 0.9% FLUSH
10.0000 mL | INTRAVENOUS | Status: DC | PRN
  Administered 2023-08-17: 10 mL

## 2023-08-17 MED ORDER — DEXAMETHASONE SODIUM PHOSPHATE 10 MG/ML IJ SOLN
10.0000 mg | Freq: Once | INTRAMUSCULAR | Status: AC
  Administered 2023-08-17: 10 mg via INTRAVENOUS
  Filled 2023-08-17: qty 1

## 2023-08-17 MED ORDER — LIDOCAINE VISCOUS HCL 2 % MT SOLN
5.0000 mL | Freq: Three times a day (TID) | OROMUCOSAL | 1 refills | Status: AC | PRN
  Filled 2023-08-17 – 2023-09-14 (×2): qty 150, 10d supply, fill #0

## 2023-08-17 MED ORDER — DIPHENHYDRAMINE HCL 50 MG/ML IJ SOLN
25.0000 mg | Freq: Once | INTRAMUSCULAR | Status: AC
  Administered 2023-08-17: 25 mg via INTRAVENOUS
  Filled 2023-08-17: qty 1

## 2023-08-17 MED ORDER — HEPARIN SOD (PORK) LOCK FLUSH 100 UNIT/ML IV SOLN
500.0000 [IU] | Freq: Once | INTRAVENOUS | Status: AC | PRN
  Administered 2023-08-17: 500 [IU]

## 2023-08-17 MED ORDER — PALONOSETRON HCL INJECTION 0.25 MG/5ML
0.2500 mg | Freq: Once | INTRAVENOUS | Status: AC
  Administered 2023-08-17: 0.25 mg via INTRAVENOUS
  Filled 2023-08-17: qty 5

## 2023-08-17 MED ORDER — PROCHLORPERAZINE EDISYLATE 10 MG/2ML IJ SOLN
10.0000 mg | Freq: Once | INTRAMUSCULAR | Status: AC
  Administered 2023-08-17: 10 mg via INTRAVENOUS
  Filled 2023-08-17: qty 2

## 2023-08-17 MED ORDER — FAMOTIDINE IN NACL 20-0.9 MG/50ML-% IV SOLN
20.0000 mg | Freq: Once | INTRAVENOUS | Status: AC
  Administered 2023-08-17: 20 mg via INTRAVENOUS
  Filled 2023-08-17: qty 50

## 2023-08-17 MED ORDER — SODIUM CHLORIDE 0.9 % IV SOLN: INTRAVENOUS | Status: DC

## 2023-08-17 MED ORDER — MAGIC MOUTHWASH W/LIDOCAINE: 5.0000 mL | Freq: Three times a day (TID) | ORAL | 1 refills | Status: DC | PRN

## 2023-08-17 MED ORDER — SODIUM CHLORIDE 0.9 % IV SOLN
500.0000 mg/m2 | Freq: Once | INTRAVENOUS | Status: AC
  Administered 2023-08-17: 1300 mg via INTRAVENOUS
  Filled 2023-08-17: qty 50

## 2023-08-17 MED ORDER — DOXORUBICIN HCL CHEMO IV INJECTION 2 MG/ML
50.0000 mg/m2 | Freq: Once | INTRAVENOUS | Status: AC
  Administered 2023-08-17: 130 mg via INTRAVENOUS
  Filled 2023-08-17: qty 65

## 2023-08-17 NOTE — Progress Notes (Signed)
 [facility] Clinical Social Work  Clinical Social Work was referred by medical provider for assessment of psychosocial needs (copay assistance).  Clinical Social Worker contacted patient by phone to offer support and assess for needs. Patient stated infusion treatments overall have been going better. 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 Patient stated need with assistance for Compazine and Claritin being prescribed by provider, stating the co-pay originally was 91.00 and was reduced to 41.00 (patient unsure if this is due to [facility] or medical provider).  CSW assessed patient for [facility] (does not meet presumptive eligibility).  CSW reviewed co-pay assistance funds ([employer] and [employer]), patient not eligible.  CSW discussed with patient Grants that could assist with cost, but grants are not immediate solutions.  CSW called [facility] to see if cost are lower. Pharmacy staff did not have strength in database, inquired if quantity could be reduced to help with cost. CSW posed question to provider for guidance, will explore if possible.  CSW will inquire with team if other medication assistance options are available.   Patient stated she will at least purchase Claritin rx ahead of her treatment on Saturday. CSW will follow up with patient.   Jasmine Merles, LCSW  Clinical Social Worker [facility]

## 2023-08-17 NOTE — Progress Notes (Signed)
 Patient seen by Santiago Glad, NP today  Vitals are within treatment parameters:Yes  BP- 163/86 Okay to treat Labs are within treatment parameters: Yes   Treatment plan has been signed: Yes   Per physician team, Patient is ready for treatment and there are NO modifications to the treatment plan.

## 2023-08-17 NOTE — Patient Instructions (Signed)
 [facility] A DEPT OF [facility]   Discharge Instructions: Thank you for choosing [facility] to provide your oncology and hematology care.   If you have a lab appointment with the Cancer Center, please go directly to the Cancer Center and check in at the registration area.   Wear comfortable clothing and clothing appropriate for easy access to any Portacath or PICC line.   We strive to give you quality time with your provider. You may need to reschedule your appointment if you arrive late (15 or more minutes).  Arriving late affects you and other patients whose appointments are after yours.  Also, if you miss three or more appointments without notifying the office, you may be dismissed from the clinic at the provider's discretion.      For prescription refill requests, have your pharmacy contact our office and allow 72 hours for refills to be completed.    Today you received the following chemotherapy and/or immunotherapy agents Adriamycin/Cytoxan.      To help prevent nausea and vomiting after your treatment, we encourage you to take your nausea medication as directed.  BELOW ARE SYMPTOMS THAT SHOULD BE REPORTED IMMEDIATELY: *FEVER GREATER THAN 100.4 F (38 C) OR HIGHER *CHILLS OR SWEATING *NAUSEA AND VOMITING THAT IS NOT CONTROLLED WITH YOUR NAUSEA MEDICATION *UNUSUAL SHORTNESS OF BREATH *UNUSUAL BRUISING OR BLEEDING *URINARY PROBLEMS (pain or burning when urinating, or frequent urination) *BOWEL PROBLEMS (unusual diarrhea, constipation, pain near the anus) TENDERNESS IN MOUTH AND THROAT WITH OR WITHOUT PRESENCE OF ULCERS (sore throat, sores in mouth, or a toothache) UNUSUAL RASH, SWELLING OR PAIN  UNUSUAL VAGINAL DISCHARGE OR ITCHING   Items with * indicate a potential emergency and should be followed up as soon as possible or go to the Emergency Department if any problems should occur.  Please show the CHEMOTHERAPY ALERT CARD or  IMMUNOTHERAPY ALERT CARD at check-in to the Emergency Department and triage nurse.  Should you have questions after your visit or need to cancel or reschedule your appointment, please contact [facility]  Dept: [phone]  and follow the prompts.  Office hours are 8:00 a.m. to 4:30 p.m. Monday - Friday. Please note that voicemails left after 4:00 p.m. may not be returned until the following business day.  We are closed weekends and major holidays. You have access to a nurse at all times for urgent questions. Please call the main number to the clinic Dept: [phone] and follow the prompts.   For any non-urgent questions, you may also contact your provider using MyChart. We now offer e-Visits for anyone 42 and older to request care online for non-urgent symptoms. For details visit [email or web address].   Also download the MyChart app! Go to the app store, search "MyChart", open the app, select [place], and log in with your MyChart username and password.  Doxorubicin Injection What is this medication? DOXORUBICIN (dox oh ROO bi sin) treats some types of cancer. It works by slowing down the growth of cancer cells. This medicine may be used for other purposes; ask your health care provider or pharmacist if you have questions. COMMON BRAND NAME(S): Adriamycin, Adriamycin PFS, Adriamycin RDF, Rubex What should I tell my care team before I take this medication? They need to know if you have any of these conditions: Heart disease History of low blood cell levels caused by a medication Liver disease Recent or ongoing  radiation An unusual or allergic reaction to doxorubicin, other medications, foods, dyes, or preservatives If you or your partner are pregnant or trying to get pregnant Breast-feeding How should I use this medication? This medication is injected into a vein. It is given by your care team in a hospital or clinic  setting. Talk to your care team about the use of this medication in children. Special care may be needed. Overdosage: If you think you have taken too much of this medicine contact a poison control center or emergency room at once. NOTE: This medicine is only for you. Do not share this medicine with others. What if I miss a dose? Keep appointments for follow-up doses. It is important not to miss your dose. Call your care team if you are unable to keep an appointment. What may interact with this medication? 6-mercaptopurine Paclitaxel Phenytoin St. John's wort Trastuzumab Verapamil This list may not describe all possible interactions. Give your health care provider a list of all the medicines, herbs, non-prescription drugs, or dietary supplements you use. Also tell them if you smoke, drink alcohol, or use illegal drugs. Some items may interact with your medicine. What should I watch for while using this medication? Your condition will be monitored carefully while you are receiving this medication. You may need blood work while taking this medication. This medication may make you feel generally unwell. This is not uncommon as chemotherapy can affect healthy cells as well as cancer cells. Report any side effects. Continue your course of treatment even though you feel ill unless your care team tells you to stop. There is a maximum amount of this medication you should receive throughout your life. The amount depends on the medical condition being treated and your overall health. Your care team will watch how much of this medication you receive. Tell your care team if you have taken this medication before. Your urine may turn red for a few days after your dose. This is not blood. If your urine is dark or brown, call your care team. In some cases, you may be given additional medications to help with side effects. Follow all directions for their use. This medication may increase your risk of getting an  infection. Call your care team for advice if you get a fever, chills, sore throat, or other symptoms of a cold or flu. Do not treat yourself. Try to avoid being around people who are sick. This medication may increase your risk to bruise or bleed. Call your care team if you notice any unusual bleeding. Talk to your care team about your risk of cancer. You may be more at risk for certain types of cancers if you take this medication. Talk to your care team if you or your partner may be pregnant. Serious birth defects can occur if you take this medication during pregnancy and for 6 months after the last dose. Contraception is recommended while taking this medication and for 6 months after the last dose. Your care team can help you find the option that works for you. If your partner can get pregnant, use a condom while taking this medication and for 6 months after the last dose. Do not breastfeed while taking this medication. This medication may cause infertility. Talk to your care team if you are concerned about your fertility. What side effects may I notice from receiving this medication? Side effects that you should report to your care team as soon as possible: Allergic reactions--skin rash, itching, hives, swelling of  the face, lips, tongue, or throat Heart failure--shortness of breath, swelling of the ankles, feet, or hands, sudden weight gain, unusual weakness or fatigue Heart rhythm changes--fast or irregular heartbeat, dizziness, feeling faint or lightheaded, chest pain, trouble breathing Infection--fever, chills, cough, sore throat, wounds that don't heal, pain or trouble when passing urine, general feeling of discomfort or being unwell Low red blood cell level--unusual weakness or fatigue, dizziness, headache, trouble breathing Painful swelling, warmth, or redness of the skin, blisters or sores at the infusion site Unusual bruising or bleeding Side effects that usually do not require medical  attention (report to your care team if they continue or are bothersome): Diarrhea Hair loss Nausea Pain, redness, or swelling with sores inside the mouth or throat Red urine This list may not describe all possible side effects. Call your doctor for medical advice about side effects. You may report side effects to FDA at 1-800-FDA-1088. Where should I keep my medication? This medication is given in a hospital or clinic. It will not be stored at home. NOTE: This sheet is a summary. It may not cover all possible information. If you have questions about this medicine, talk to your doctor, pharmacist, or health care provider.  2024 Elsevier/Gold Standard (2022-08-25 00:00:00)  Cyclophosphamide Injection What is this medication? CYCLOPHOSPHAMIDE (sye kloe FOSS fa mide) treats some types of cancer. It works by slowing down the growth of cancer cells. This medicine may be used for other purposes; ask your health care provider or pharmacist if you have questions. COMMON BRAND NAME(S): Cyclophosphamide, Cytoxan, Neosar What should I tell my care team before I take this medication? They need to know if you have any of these conditions: Heart disease Irregular heartbeat or rhythm Infection Kidney problems Liver disease Low blood cell levels (white cells, platelets, or red blood cells) Lung disease Previous radiation Trouble passing urine An unusual or allergic reaction to cyclophosphamide, other medications, foods, dyes, or preservatives Pregnant or trying to get pregnant Breast-feeding How should I use this medication? This medication is injected into a vein. It is given by your care team in a hospital or clinic setting. Talk to your care team about the use of this medication in children. Special care may be needed. Overdosage: If you think you have taken too much of this medicine contact a poison control center or emergency room at once. NOTE: This medicine is only for you. Do not share  this medicine with others. What if I miss a dose? Keep appointments for follow-up doses. It is important not to miss your dose. Call your care team if you are unable to keep an appointment. What may interact with this medication? Amphotericin B Amiodarone Azathioprine Certain antivirals for HIV or hepatitis Certain medications for blood pressure, such as enalapril, lisinopril, quinapril Cyclosporine Diuretics Etanercept Indomethacin Medications that relax muscles Metronidazole Natalizumab Tamoxifen Warfarin This list may not describe all possible interactions. Give your health care provider a list of all the medicines, herbs, non-prescription drugs, or dietary supplements you use. Also tell them if you smoke, drink alcohol, or use illegal drugs. Some items may interact with your medicine. What should I watch for while using this medication? This medication may make you feel generally unwell. This is not uncommon as chemotherapy can affect healthy cells as well as cancer cells. Report any side effects. Continue your course of treatment even though you feel ill unless your care team tells you to stop. You may need blood work while you are taking this medication.  This medication may increase your risk of getting an infection. Call your care team for advice if you get a fever, chills, sore throat, or other symptoms of a cold or flu. Do not treat yourself. Try to avoid being around people who are sick. Avoid taking medications that contain aspirin, acetaminophen, ibuprofen, naproxen, or ketoprofen unless instructed by your care team. These medications may hide a fever. Be careful brushing or flossing your teeth or using a toothpick because you may get an infection or bleed more easily. If you have any dental work done, tell your dentist you are receiving this medication. Drink water or other fluids as directed. Urinate often, even at night. Some products may contain alcohol. Ask your care team  if this medication contains alcohol. Be sure to tell all care teams you are taking this medicine. Certain medicines, like metronidazole and disulfiram, can cause an unpleasant reaction when taken with alcohol. The reaction includes flushing, headache, nausea, vomiting, sweating, and increased thirst. The reaction can last from 30 minutes to several hours. Talk to your care team if you wish to become pregnant or think you might be pregnant. This medication can cause serious birth defects if taken during pregnancy and for 1 year after the last dose. A negative pregnancy test is required before starting this medication. A reliable form of contraception is recommended while taking this medication and for 1 year after the last dose. Talk to your care team about reliable forms of contraception. Do not father a child while taking this medication and for 4 months after the last dose. Use a condom during this time period. Do not breast-feed while taking this medication or for 1 week after the last dose. This medication may cause infertility. Talk to your care team if you are concerned about your fertility. Talk to your care team about your risk of cancer. You may be more at risk for certain types of cancer if you take this medication. What side effects may I notice from receiving this medication? Side effects that you should report to your care team as soon as possible: Allergic reactions--skin rash, itching, hives, swelling of the face, lips, tongue, or throat Dry cough, shortness of breath or trouble breathing Heart failure--shortness of breath, swelling of the ankles, feet, or hands, sudden weight gain, unusual weakness or fatigue Heart muscle inflammation--unusual weakness or fatigue, shortness of breath, chest pain, fast or irregular heartbeat, dizziness, swelling of the ankles, feet, or hands Heart rhythm changes--fast or irregular heartbeat, dizziness, feeling faint or lightheaded, chest pain, trouble  breathing Infection--fever, chills, cough, sore throat, wounds that don't heal, pain or trouble when passing urine, general feeling of discomfort or being unwell Kidney injury--decrease in the amount of urine, swelling of the ankles, hands, or feet Liver injury--right upper belly pain, loss of appetite, nausea, light-colored stool, dark yellow or brown urine, yellowing skin or eyes, unusual weakness or fatigue Low red blood cell level--unusual weakness or fatigue, dizziness, headache, trouble breathing Low sodium level--muscle weakness, fatigue, dizziness, headache, confusion Red or dark brown urine Unusual bruising or bleeding Side effects that usually do not require medical attention (report to your care team if they continue or are bothersome): Hair loss Irregular menstrual cycles or spotting Loss of appetite Nausea Pain, redness, or swelling with sores inside the mouth or throat Vomiting This list may not describe all possible side effects. Call your doctor for medical advice about side effects. You may report side effects to FDA at 1-800-FDA-1088. Where should I  keep my medication? This medication is given in a hospital or clinic. It will not be stored at home. NOTE: This sheet is a summary. It may not cover all possible information. If you have questions about this medicine, talk to your doctor, pharmacist, or health care provider.  2024 Elsevier/Gold Standard (2021-10-08 00:00:00)

## 2023-08-19 ENCOUNTER — Other Ambulatory Visit (HOSPITAL_BASED_OUTPATIENT_CLINIC_OR_DEPARTMENT_OTHER): Payer: Self-pay

## 2023-08-19 ENCOUNTER — Inpatient Hospital Stay

## 2023-08-19 VITALS — BP 177/79 | HR 87 | Temp 97.1°F | Resp 17

## 2023-08-19 DIAGNOSIS — Z5111 Encounter for antineoplastic chemotherapy: Secondary | ICD-10-CM | POA: Diagnosis not present

## 2023-08-19 DIAGNOSIS — C50911 Malignant neoplasm of unspecified site of right female breast: Secondary | ICD-10-CM

## 2023-08-19 MED ORDER — PEGFILGRASTIM-CBQV 6 MG/0.6ML ~~LOC~~ SOSY
6.0000 mg | PREFILLED_SYRINGE | Freq: Once | SUBCUTANEOUS | Status: AC
  Administered 2023-08-19: 6 mg via SUBCUTANEOUS
  Filled 2023-08-19: qty 0.6

## 2023-08-19 NOTE — Progress Notes (Signed)
 Pt here today for injection, Kim C, LPN noted pt bp elevated and reached out to come assess pt. After discussing with pt, it was noted that she was feeling nauseous and had not taken any of her medication prior to coming in today & that she had walked a long way to get back to infusion. Pt states yesterday she was able to eat and drink but today she only was able to drink. Pt educated on the use of both nausea medications she has at home, pt was not aware she could take her compazine. Discussed with pt to take her compazine when she got home 30 min prior to attempting to take her other daily medications. Educated pt on what day she was able to begin her zofran as she states that usually works best for her. Pt aware and agreeable to plan. Allowed pt to rest for about 15 min and gave her some ginger ale. Pt stated she was feeling much better and also had an improvement in blood pressure. Pt was ambulatory to lobby upon discharge, knows to call if she is unable to eat or drink after taking her nausea medications or if any other issues occur.

## 2023-08-19 NOTE — Progress Notes (Signed)
 Patient here for Udenyca injection.  Blood pressure noted elevated.  Rechecked blood pressure and blood pressure remains elevated.  Patient states she takes blood pressure medication, but due to nausea she was unable to take her medication today.  Libby/RN came over to access the patient.  States patient should take her compazine when she gets home and then once things settle down, it would be ok to take her blood pressure.  Patient given ginger ale and Almyra Free re access her.  Blood pressure rechecked and noted the decrease.  Patient states she felt much better.  Almyra Free spoke with patient and patient was ok to leave.

## 2023-08-21 ENCOUNTER — Other Ambulatory Visit (HOSPITAL_BASED_OUTPATIENT_CLINIC_OR_DEPARTMENT_OTHER): Payer: Self-pay

## 2023-08-22 ENCOUNTER — Telehealth: Payer: Self-pay

## 2023-08-22 ENCOUNTER — Other Ambulatory Visit: Payer: BLUE CROSS/BLUE SHIELD

## 2023-08-22 ENCOUNTER — Other Ambulatory Visit: Payer: No Typology Code available for payment source

## 2023-08-22 ENCOUNTER — Ambulatory Visit: Payer: No Typology Code available for payment source | Admitting: Oncology

## 2023-08-22 ENCOUNTER — Ambulatory Visit: Payer: BLUE CROSS/BLUE SHIELD | Admitting: Oncology

## 2023-08-22 NOTE — Telephone Encounter (Signed)
-----   Message from Pollyann Samples sent at 08/20/2023  8:47 PM EDT ----- Please let pt know doppler is negative, no DVT to explain unequal leg edema. Please continue compression stockings and elevation.   Thanks Lacie NP

## 2023-08-22 NOTE — Telephone Encounter (Addendum)
 Attempted to call patient, left voicemail to call [facility] in regards to  doppler being negative, there is no DVT, elevate legs, wear compression stockings.

## 2023-08-23 ENCOUNTER — Other Ambulatory Visit: Payer: Self-pay | Admitting: Oncology

## 2023-08-23 ENCOUNTER — Inpatient Hospital Stay: Admitting: Nutrition

## 2023-08-23 ENCOUNTER — Telehealth: Payer: Self-pay

## 2023-08-23 ENCOUNTER — Encounter: Payer: Self-pay | Admitting: Nutrition

## 2023-08-23 DIAGNOSIS — C50911 Malignant neoplasm of unspecified site of right female breast: Secondary | ICD-10-CM

## 2023-08-23 NOTE — Telephone Encounter (Signed)
 Attempted to call patient again, left voicemail to call [facility] in regards to doppler being negative, there is no DVT, elevate legs, wear compression stockings. Will also send results through MyChart.

## 2023-08-23 NOTE — Progress Notes (Unsigned)
 Contacted patient for scheduled nutrition consult. She was not available but left message and phone number for her to return call.

## 2023-08-24 ENCOUNTER — Encounter: Payer: Self-pay | Admitting: Oncology

## 2023-08-24 ENCOUNTER — Ambulatory Visit: Payer: BLUE CROSS/BLUE SHIELD

## 2023-08-24 ENCOUNTER — Ambulatory Visit: Payer: No Typology Code available for payment source

## 2023-08-26 ENCOUNTER — Ambulatory Visit: Payer: No Typology Code available for payment source

## 2023-08-29 ENCOUNTER — Other Ambulatory Visit: Payer: No Typology Code available for payment source

## 2023-08-29 ENCOUNTER — Inpatient Hospital Stay (HOSPITAL_BASED_OUTPATIENT_CLINIC_OR_DEPARTMENT_OTHER): Admitting: Oncology

## 2023-08-29 ENCOUNTER — Inpatient Hospital Stay

## 2023-08-29 ENCOUNTER — Other Ambulatory Visit: Payer: Self-pay

## 2023-08-29 ENCOUNTER — Encounter: Payer: Self-pay | Admitting: Oncology

## 2023-08-29 ENCOUNTER — Ambulatory Visit: Payer: No Typology Code available for payment source | Admitting: Oncology

## 2023-08-29 VITALS — BP 168/84 | HR 96 | Resp 18 | Ht 66.0 in | Wt 308.6 lb

## 2023-08-29 DIAGNOSIS — J302 Other seasonal allergic rhinitis: Secondary | ICD-10-CM

## 2023-08-29 DIAGNOSIS — R110 Nausea: Secondary | ICD-10-CM | POA: Insufficient documentation

## 2023-08-29 DIAGNOSIS — T451X5A Adverse effect of antineoplastic and immunosuppressive drugs, initial encounter: Secondary | ICD-10-CM

## 2023-08-29 DIAGNOSIS — G893 Neoplasm related pain (acute) (chronic): Secondary | ICD-10-CM | POA: Diagnosis not present

## 2023-08-29 DIAGNOSIS — J309 Allergic rhinitis, unspecified: Secondary | ICD-10-CM | POA: Insufficient documentation

## 2023-08-29 DIAGNOSIS — Z5111 Encounter for antineoplastic chemotherapy: Secondary | ICD-10-CM | POA: Diagnosis not present

## 2023-08-29 DIAGNOSIS — C50911 Malignant neoplasm of unspecified site of right female breast: Secondary | ICD-10-CM | POA: Diagnosis not present

## 2023-08-29 DIAGNOSIS — I1A0 Resistant hypertension: Secondary | ICD-10-CM | POA: Diagnosis not present

## 2023-08-29 LAB — CMP (CANCER CENTER ONLY)
ALT: 15 U/L (ref 0–44)
AST: 13 U/L — ABNORMAL LOW (ref 15–41)
Albumin: 3.9 g/dL (ref 3.5–5.0)
Alkaline Phosphatase: 93 U/L (ref 38–126)
Anion gap: 7 (ref 5–15)
BUN: 10 mg/dL (ref 6–20)
CO2: 27 mmol/L (ref 22–32)
Calcium: 8.7 mg/dL — ABNORMAL LOW (ref 8.9–10.3)
Chloride: 104 mmol/L (ref 98–111)
Creatinine: 0.86 mg/dL (ref 0.44–1.00)
GFR, Estimated: >60 mL/min (ref >60)
Glucose, Bld: 100 mg/dL — ABNORMAL HIGH (ref 70–99)
Potassium: 3.7 mmol/L (ref 3.5–5.1)
Sodium: 138 mmol/L (ref 135–145)
Total Bilirubin: 0.4 mg/dL (ref 0.0–1.2)
Total Protein: 7.1 g/dL (ref 6.5–8.1)

## 2023-08-29 LAB — CBC WITH DIFFERENTIAL (CANCER CENTER ONLY)
Abs Immature Granulocytes: 0.09 10*3/uL — ABNORMAL HIGH (ref 0.00–0.07)
Basophils Absolute: 0 10*3/uL (ref 0.0–0.1)
Basophils Relative: 1 %
Eosinophils Absolute: 0.1 10*3/uL (ref 0.0–0.5)
Eosinophils Relative: 3 %
HCT: 33.6 % — ABNORMAL LOW (ref 36.0–46.0)
Hemoglobin: 10.8 g/dL — ABNORMAL LOW (ref 12.0–15.0)
Immature Granulocytes: 2 %
Lymphocytes Relative: 19 %
Lymphs Abs: 0.8 10*3/uL (ref 0.7–4.0)
MCH: 27.1 pg (ref 26.0–34.0)
MCHC: 32.1 g/dL (ref 30.0–36.0)
MCV: 84.2 fL (ref 80.0–100.0)
Monocytes Absolute: 0.8 10*3/uL (ref 0.1–1.0)
Monocytes Relative: 18 %
Neutro Abs: 2.5 10*3/uL (ref 1.7–7.7)
Neutrophils Relative %: 57 %
Platelet Count: 184 10*3/uL (ref 150–400)
RBC: 3.99 MIL/uL (ref 3.87–5.11)
RDW: 15.1 % (ref 11.5–15.5)
WBC Count: 4.3 10*3/uL (ref 4.0–10.5)
nRBC: 0 % (ref 0.0–0.2)

## 2023-08-29 LAB — MAGNESIUM: Magnesium: 1.8 mg/dL (ref 1.7–2.4)

## 2023-08-29 NOTE — Progress Notes (Signed)
 Patient seen by Dr. Archie Patten Pasam today  Vitals are within treatment parameters:Yes   Labs are within treatment parameters: Yes   Treatment plan has been signed: Yes   Per physician team, Patient is ready for treatment and there are NO modifications to the treatment plan.  Patient is good for Tx this Thursday and good for injection this Saturday.

## 2023-08-29 NOTE — Assessment & Plan Note (Addendum)
 Patient has a history of hypertension and was previously misdiagnosed with congestive heart failure. Currently on Amlodipine once daily, Carvedilol twice daily, irbesartan daily, spironolactone daily, and Furosemide once daily. -Continue current medications. -Emphasize the importance of good blood pressure control, especially in the context of ongoing chemotherapy.

## 2023-08-29 NOTE — Addendum Note (Signed)
 Encounter addended by: Edward Qualia on: 08/29/2023 4:04 PM  Actions taken: Imaging Exam ended

## 2023-08-29 NOTE — Assessment & Plan Note (Signed)
 She reports symptoms of allergic rhinitis, including congestion, runny nose, sneezing, and nighttime cough. She is currently taking Claritin but continues to experience symptoms, particularly at night. She has been advised to consider using Robitussin or Mucinex for cough and to use steam inhalation to alleviate congestion. A prescription for additional allergy medication will be sent if needed.  - Continue Claritin for allergy management - Consider using Robitussin or Mucinex for cough - Use steam inhalation to alleviate congestion - Send prescription for additional allergy medication if needed.  Given her issues with high blood pressure, decongestants are being avoided at this time.

## 2023-08-29 NOTE — Progress Notes (Signed)
 [facility] CSW Progress Note  Visual merchandiser met with patient during infusion to discuss financial assistance and medication challenges.   CSW and patient discussed financial impact related to diagnosis and treatment. CSW provided [employer] application, Information on Clinical biochemist ([employer]). Patient will turn in completed applications or documentation to CSW for submission.Patient was provided a food bag.  CSW discussed medication affordability with provider. Provider will work with patient to help find solutions.  CSW will assist if needed.  CSW will continue to follow.   Marguerita Merles, LCSW Clinical Social Worker [facility]

## 2023-08-29 NOTE — Progress Notes (Addendum)
  [facility]  ONCOLOGY CLINIC PROGRESS NOTE   Patient Care Team: Carolin Coy as PCP - General (Physician Assistant)  PATIENT NAME: Jasmine Novak   MR#: [id number] DOB: 12/22/1970  Date of visit: 08/29/2023   ASSESSMENT & PLAN:   Donni Oglesby Adamson is a 53 y.o. lady with a past medical history of hypertension, was referred to our clinic in December 2024 for newly diagnosed inflammatory carcinoma of right breast.  ER weakly positive at 10%, PR negative, HER2/neu negative.  Clinically behaving like triple negative breast cancer.  Had severe infusion reaction to first dose of Taxol and later moderate reaction to Taxotere.  Treatment switched to to dose dense AC, to be followed by weekly Abraxane.  Inflammatory carcinoma of breast, right (HCC) Recently diagnosed with right breast mass (6 cm) and biopsy confirmed malignancy. Estrogen receptor weakly positive at 10%, progesterone receptor negative, and HER2 negative. Left breast has a small mass (6-8 mm) not concerning for malignancy. No lymph node involvement on biopsy but ultrasound was concerning for 3-4 right axillary lymph nodes. Inflammatory breast cancer.  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  -Previously I discussed staging, prognosis, plan of care, treatment options.  Reviewed NCCN guidelines.  On 06/13/2023, staging PET/CT showed intense metabolic activity within the right breast mass consistent with primary breast carcinoma.  Hypermetabolic right axillary and subpectoralis lymph nodes consistent with local nodal metastasis.  No evidence of distant metastatic disease.    Plan made to proceed with cycle 1, day 1 of carboplatin, paclitaxel and Keytruda on 07/04/2023. She received Keytruda first.  Later she started Taxol and few minutes into infusion, she had severe infusion reaction with chest pain, hypotension, diaphoresis, hypoxia.  EKG was unremarkable.  Blood pressure and O2 sats normalized after  additional allergy medication and supplemental oxygen. We will not rechallenge her with Taxol. Plan made to switch treatments to Taxotere and cyclophosphamide regimen, to be given every 3 weeks for up to 6 cycles.  On 07/13/2023, she had infusion reaction to Taxotere as well, although not as severe as she had with Taxol.  She mainly had persistent lower back pain, increased blood pressure and some chest discomfort, which resolved quickly with pausing the infusion and Tylenol.  Plan made to switch treatments to dose dense AC, followed by weekly Abraxane. Started AC from 07/27/2023.  Received 2 cycles so far.  She already had significant improvement, including resolution of redness and pain. The current regimen is achieving the desired response.  Plan to continue for 2 more cycles of dose dense AC.    With cycle 2, we had to dose reduce chemotherapy by approximately 20% because of previous cytopenias despite growth factor support.  Blood counts are currently within normal limits.  Will proceed with full strength of chemotherapy with cycle 3 on 08/31/2023.    Following completion of 4 cycles of dose dense AC, treatment will be switched to Abraxane weekly for up to 12 cycles.  Due to previous adverse reactions to chemotherapy, the upcoming switch to an nab-paclitaxel will be administered with caution.  Generally speaking, there is no cross-reactivity for nab-paclitaxel, anticipated to be well-tolerated due to its protein-bound nature.   Per patient preference, we will continue chemotherapy on Thursdays going forward, to accommodate her work schedule.  Cancer associated pain This has resolved now.  Resistant hypertension Patient has a history of hypertension and was previously misdiagnosed with congestive heart failure. Currently on Amlodipine once daily, Carvedilol twice daily,  irbesartan daily, spironolactone daily, and Furosemide once daily. -Continue current medications. -Emphasize the importance of  good blood pressure control, especially in the context of ongoing chemotherapy.  Chemotherapy-induced nausea She experiences significant nausea following chemotherapy, particularly on the second and third days post-treatment, and has difficulty swallowing pill. The nausea has worsened with ongoing treatments. She has been advised to crush and mix Decadron and Compazine tablets to aid swallowing and to start Zofran the day after chemotherapy if nausea persists.   - Crush and mix Decadron and Compazine tablets to aid swallowing - Start Zofran the day after chemotherapy if nausea persists - Consider alternative anti-nausea medications if needed  Allergic rhinitis She reports symptoms of allergic rhinitis, including congestion, runny nose, sneezing, and nighttime cough. She is currently taking Claritin but continues to experience symptoms, particularly at night. She has been advised to consider using Robitussin or Mucinex for cough and to use steam inhalation to alleviate congestion. A prescription for additional allergy medication will be sent if needed.  - Continue Claritin for allergy management - Consider using Robitussin or Mucinex for cough - Use steam inhalation to alleviate congestion - Send prescription for additional allergy medication if needed.  Given her issues with high blood pressure, decongestants are being avoided at this time.   I reviewed lab results and outside records for this visit and discussed relevant results with the patient. Diagnosis, plan of care and treatment options were also discussed in detail with the patient. Opportunity provided to ask questions and answers provided to her apparent satisfaction. Provided instructions to call our clinic with any problems, questions or concerns prior to return visit. I recommended to continue follow-up with PCP and sub-specialists. She verbalized understanding and agreed with the plan.   NCCN guidelines have been consulted in the  planning of this patient's care.  I spent a total of 30 minutes during this encounter with the patient including review of chart and various tests results, discussions about plan of care and coordination of care plan.  Meryl Crutch, MD  08/29/2023 2:35 PM  [facility] - A DEPT OF [facility] [address] 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  Current Treatment: Plan made initially for neoadjuvant chemotherapy using KEYNOTE-522 protocol (carboplatin and paclitaxel plus Keytruda for 4 cycles followed by Adriamycin, Cytoxan and Keytruda for 4 cycles followed by 9 more doses of adjuvant Keytruda).  Started cycle 1 of chemotherapy on 07/04/2023.  She received Keytruda first.  Later she started Taxol and few minutes into infusion, she had severe infusion reaction with chest pain, hypotension, diaphoresis, hypoxia.  EKG was unremarkable.  Blood pressure and O2 sats normalized after additional allergy medication and supplemental oxygen.  We will not rechallenge her with Taxol.  Plan made to switch treatments to Taxotere and cyclophosphamide regimen, to be given every 3 weeks for up to 6 cycles.  He had moderate reaction to Taxotere as well.  Hence plan made to switch treatments to [facility] regimen in a dose dense fashion for up to 4 cycles, starting from 07/27/2023.  This will be followed by Abraxane weekly.  INTERVAL HISTORY:    Discussed the use of AI scribe software for clinical note transcription with the patient, who gave verbal consent to proceed.  Evadne L Castrejon is here today for repeat clinical assessment.   She reports feeling "great" overall, with no noticeable side effects from the treatment. However,  she is experiencing difficulty swallowing pills, particularly those for managing nausea. The patient describes the nausea as severe, particularly on the days following chemotherapy, to the point where she is unable to eat and struggles to swallow medication. She has been managing the nausea with Zofran and Decadron, but has had difficulty swallowing these medications. The patient also reports allergy symptoms, including congestion, runny nose, sneezing, and a cough that is particularly bothersome at night.  I have reviewed the past medical history, past surgical history, social history and family history with the patient and they are unchanged from previous note.  HISTORY OF PRESENT ILLNESS:   ONCOLOGY HISTORY:  The patient has not been getting regular mammograms previously and has not seen a primary doctor recently.    She reports lump in her right breast that she noticed in mid-October 2024. The lump was causing discomfort, particularly when she is moving or wearing a bra. The patient reports that the area around the lump is tender and warm to the touch. She has been taking ibuprofen to manage the discomfort. She is right-handed and has been trying to avoid using her right arm due to the discomfort from the lump.    Since pain and swelling persisted, she presented to the ED on 05/07/2023.  A CT chest with contrast was obtained on 05/07/2023 which showed 5 x 4.2 cm mass within the right breast with diffuse cutaneous thickening overlying the right breast, highly suspicious for primary breast malignancy.  Right axillary lymphadenopathy measuring up to 13 mm, suspicious for metastatic disease.  A 3 mm subpleural right upper lobe lung nodule, indeterminate. Coronary artery calcifications and left atrial enlargement was also noted.   ED provider reached out to our clinic for further assistance and management.  We arranged for diagnostic mammogram, ultrasound and biopsy.   On 05/22/2023, diagnostic  mammogram showed 5.3 cm highly suspicious mass in the right breast associated with adjacent inflammation and overlying skin thickening consistent with inflammatory breast carcinoma.  3-4 abnormal right axillary lymph nodes, suspicious for metastatic disease.  2 adjacent benign clusters of cysts in the left breast.  No evidence of left breast malignancy.  Ultrasound showed similar findings.   On 05/23/2023 she underwent core needle biopsy of right breast mass.  Pathology showed invasive ductal carcinoma, mitotic score 3, grade 3.  ER weakly positive at 10%.  PR negative, HER2/neu was 2+ by IHC, negative by FISH.  High Ki-67 index of 80%.   With these findings, she presented to clinic to establish care with [place] on 06/06/2023.  Request submitted for PET/CT and echocardiogram.   On 06/13/2023, staging PET/CT showed intense metabolic activity within the right breast mass consistent with primary breast carcinoma.  Hypermetabolic right axillary and subpectoralis lymph nodes consistent with local nodal metastasis.  No evidence of distant metastatic disease.    Given no evidence of metastatic disease, plan is to treat her with neoadjuvant chemoimmunotherapy using KEYNOTE-522 protocol (carboplatin and paclitaxel plus Keytruda for 4 cycles followed by Adriamycin, Cytoxan and Keytruda for 4 cycles followed by 9 more doses of adjuvant Keytruda).  Started systemic treatments from 07/04/2023.  Started cycle 1 of chemotherapy on 07/04/2023.  She received Keytruda first.  Later she started Taxol and few minutes into infusion, she had severe infusion reaction with chest pain, hypotension, diaphoresis, hypoxia.  EKG was unremarkable.  Blood pressure and O2 sats normalized after additional allergy medication and supplemental oxygen.   We will not rechallenge her with Taxol.   Plan made to switch treatments to Taxotere and cyclophosphamide regimen, to be given every 3 weeks for up to 6 cycles.  On 07/13/2023, she had infusion  reaction to Taxotere as well, although not as severe as she had with Taxol.  She mainly had persistent lower back pain, increased blood pressure and some chest discomfort, which resolved quickly with pausing the infusion and Tylenol.   Plan made to switch treatments to dose dense AC, followed by weekly Abraxane.  Started this from 07/27/2023.   Oncology History  Inflammatory carcinoma of breast, right (HCC)  06/08/2023 Initial Diagnosis   Inflammatory carcinoma of breast, right (HCC)   06/15/2023 Cancer Staging   Staging form: Breast, AJCC 8th Edition - Clinical: Stage IIIC (cT4b, cN2, cM0, G3, ER+, PR-, HER2-) - Signed by Meryl Crutch, MD on 06/15/2023 Histologic grading system: 3 grade system   07/04/2023 - 07/04/2023 Chemotherapy   Patient is on Treatment Plan : BREAST Pembrolizumab (200) D1 + Carboplatin (5) D1 + Paclitaxel (80) D1,8,15 q21d X 4 cycles / Pembrolizumab (200) D1 + AC D1 q21d x 4 cycles     07/13/2023 - 07/14/2023 Chemotherapy   Patient is on Treatment Plan : BREAST TC q21d     07/27/2023 -  Chemotherapy   Patient is on Treatment Plan : BREAST DOSE DENSE AC q14d / Nab-Paclitaxel (Abraxane) q7d         REVIEW OF SYSTEMS:   Review of Systems - Oncology  All other pertinent systems were reviewed with the patient and are negative.  ALLERGIES: She is allergic to paclitaxel, docetaxel, lisinopril, and fosaprepitant.  MEDICATIONS:  Current Outpatient Medications  Medication Sig Dispense Refill   amLODipine (NORVASC) 10 MG tablet Take 1 tablet (10 mg total) by mouth daily. 90 tablet 0   carvedilol (COREG) 12.5 MG tablet Take 12.5 mg by mouth.     furosemide (LASIX) 40 MG tablet TAKE 1 TABLET(40 MG) BY MOUTH DAILY 30 tablet 2   irbesartan (AVAPRO) 300 MG tablet Take by mouth.     loratadine (CLARITIN REDITABS) 10 MG dissolvable tablet Take 1 tablet (10 mg total) by mouth daily. 30 tablet 1   ondansetron (ZOFRAN-ODT) 8 MG disintegrating tablet Take 1 tablet (8 mg total) by mouth  every 8 (eight) hours as needed for nausea or vomiting. 30 tablet 1   spironolactone (ALDACTONE) 25 MG tablet Take by mouth.     ibuprofen (ADVIL) 200 MG tablet Take 200 mg by mouth every 6 (six) hours as needed. (Patient not taking: Reported on 08/29/2023)     loperamide (IMODIUM) 2 MG capsule Take 1-2 capsules (2-4 mg total) by mouth as needed for diarrhea or loose stools. (Patient not taking: Reported on 08/29/2023) 30 capsule 1   magic mouthwash (nystatin, hydrocortisone, diphenhydrAMINE, lidocaine) suspension Swish and swallow 5 mLs 3 (three) times daily as needed for mouth pain. (Patient not taking: Reported on 08/10/2023) 500 mL 1   magic mouthwash (lidocaine, diphenhydrAMINE, alum & mag hydroxide) suspension Swish and swallow 5 mLs by mouth 3 (three) times daily as needed for mouth pain. (Patient not taking: Reported on 08/29/2023) 150 mL 1   traMADol (ULTRAM) 50 MG tablet Take 1 tablet (50 mg total) by mouth every 6 (six) hours as needed. (Patient not taking: Reported on 08/29/2023) 90 tablet 0   No current facility-administered medications for this visit.  VITALS:   Blood pressure (!) 168/84, pulse 96, resp. rate 18, height 5\' 6"  (1.676 m), weight (!) 308 lb 9.6 oz (140 kg), last menstrual period 01/17/2022, SpO2 100%.  Wt Readings from Last 3 Encounters:  08/29/23 (!) 308 lb 9.6 oz (140 kg)  08/17/23 (!) 314 lb 9.6 oz (142.7 kg)  08/10/23 (!) 314 lb 3.2 oz (142.5 kg)    Body mass index is 49.81 kg/m.   Onc Performance Status - 08/29/23 1216       ECOG Perf Status   ECOG Perf Status Restricted in physically strenuous activity but ambulatory and able to carry out work of a light or sedentary nature, e.g., light house work, office work      KPS SCALE   KPS % SCORE Normal, no compliants, no evidence of disease                PHYSICAL EXAM:   Physical Exam Constitutional:      General: She is not in acute distress.    Appearance: Normal appearance.  HENT:     Head:  Normocephalic and atraumatic.  Eyes:     General: No scleral icterus.    Conjunctiva/sclera: Conjunctivae normal.  Cardiovascular:     Rate and Rhythm: Normal rate and regular rhythm.     Heart sounds: Normal heart sounds.  Pulmonary:     Effort: Pulmonary effort is normal.     Breath sounds: Normal breath sounds.  Chest:     Comments: Previously noted erythema of the skin on the right breast has resolved.  No definite palpable lump at this time.  Scattered nodularity in the right breast, much improved compared to prior.  Overall excellent clinical response. Abdominal:     General: There is no distension.  Musculoskeletal:     Right lower leg: No edema.     Left lower leg: No edema.  Neurological:     General: No focal deficit present.     Mental Status: She is alert and oriented to person, place, and time.  Psychiatric:        Mood and Affect: Mood normal.        Behavior: Behavior normal.        Thought Content: Thought content normal.      LABORATORY DATA:   I have reviewed the data as listed.  Results for orders placed or performed in visit on 08/29/23  Magnesium  Result Value Ref Range   Magnesium 1.8 1.7 - 2.4 mg/dL  CMP (Cancer Center only)  Result Value Ref Range   Sodium 138 135 - 145 mmol/L   Potassium 3.7 3.5 - 5.1 mmol/L   Chloride 104 98 - 111 mmol/L   CO2 27 22 - 32 mmol/L   Glucose, Bld 100 (H) 70 - 99 mg/dL   BUN 10 6 - 20 mg/dL   Creatinine 4.09 8.11 - 1.00 mg/dL   Calcium 8.7 (L) 8.9 - 10.3 mg/dL   Total Protein 7.1 6.5 - 8.1 g/dL   Albumin 3.9 3.5 - 5.0 g/dL   AST 13 (L) 15 - 41 U/L   ALT 15 0 - 44 U/L   Alkaline Phosphatase 93 38 - 126 U/L   Total Bilirubin 0.4 0.0 - 1.2 mg/dL   GFR, Estimated >91 >47 mL/min   Anion gap 7 5 - 15  CBC with Differential (Cancer Center Only)  Result Value Ref Range   WBC Count 4.3 4.0 - 10.5 K/uL   RBC 3.99 3.87 -  5.11 MIL/uL   Hemoglobin 10.8 (L) 12.0 - 15.0 g/dL   HCT 86.5 (L) 78.4 - 69.6 %   MCV 84.2 80.0  - 100.0 fL   MCH 27.1 26.0 - 34.0 pg   MCHC 32.1 30.0 - 36.0 g/dL   RDW 29.5 28.4 - 13.2 %   Platelet Count 184 150 - 400 K/uL   nRBC 0.0 0.0 - 0.2 %   Neutrophils Relative % 57 %   Neutro Abs 2.5 1.7 - 7.7 K/uL   Lymphocytes Relative 19 %   Lymphs Abs 0.8 0.7 - 4.0 K/uL   Monocytes Relative 18 %   Monocytes Absolute 0.8 0.1 - 1.0 K/uL   Eosinophils Relative 3 %   Eosinophils Absolute 0.1 0.0 - 0.5 K/uL   Basophils Relative 1 %   Basophils Absolute 0.0 0.0 - 0.1 K/uL   Immature Granulocytes 2 %   Abs Immature Granulocytes 0.09 (H) 0.00 - 0.07 K/uL       RADIOGRAPHIC STUDIES:  I have personally reviewed the radiological images as listed and agree with the findings in the report.  US Venous Img Lower Bilateral (DVT) Result Date: 08/18/2023 CLINICAL DATA:  Bilateral lower extremity edema, right greater than left. EXAM: BILATERAL LOWER EXTREMITY VENOUS DOPPLER ULTRASOUND TECHNIQUE: Gray-scale sonography with graded compression, as well as color Doppler and duplex ultrasound were performed to evaluate the lower extremity deep venous systems from the level of the common femoral vein and including the common femoral, femoral, profunda femoral, popliteal and calf veins including the posterior tibial, peroneal and gastrocnemius veins when visible. The superficial great saphenous vein was also interrogated. Spectral Doppler was utilized to evaluate flow at rest and with distal augmentation maneuvers in the common femoral, femoral and popliteal veins. COMPARISON:  None Available. FINDINGS: RIGHT LOWER EXTREMITY Common Femoral Vein: No evidence of thrombus. Normal compressibility, respiratory phasicity and response to augmentation. Saphenofemoral Junction: No evidence of thrombus. Normal compressibility and flow on color Doppler imaging. Profunda Femoral Vein: No evidence of thrombus. Normal compressibility and flow on color Doppler imaging. Femoral Vein: No evidence of thrombus. Normal  compressibility, respiratory phasicity and response to augmentation. Popliteal Vein: No evidence of thrombus. Normal compressibility, respiratory phasicity and response to augmentation. Calf Veins: No evidence of thrombus. Normal compressibility and flow on color Doppler imaging. Superficial Great Saphenous Vein: No evidence of thrombus. Normal compressibility. Venous Reflux:  None. Other Findings: No evidence of superficial thrombophlebitis or abnormal fluid collection. LEFT LOWER EXTREMITY Common Femoral Vein: No evidence of thrombus. Normal compressibility, respiratory phasicity and response to augmentation. Saphenofemoral Junction: No evidence of thrombus. Normal compressibility and flow on color Doppler imaging. Profunda Femoral Vein: No evidence of thrombus. Normal compressibility and flow on color Doppler imaging. Femoral Vein: No evidence of thrombus. Normal compressibility, respiratory phasicity and response to augmentation. Popliteal Vein: No evidence of thrombus. Normal compressibility, respiratory phasicity and response to augmentation. Calf Veins: No evidence of thrombus. Normal compressibility and flow on color Doppler imaging. Superficial Great Saphenous Vein: No evidence of thrombus. Normal compressibility. Venous Reflux:  None. Other Findings: No evidence of superficial thrombophlebitis or abnormal fluid collection. IMPRESSION: No evidence of deep venous thrombosis in either lower extremity. Electronically Signed   By: Irish Lack M.D.   On: 08/18/2023 08:36    CODE STATUS:  Code Status History     Date Active Date Inactive Code Status Order ID Comments User Context   11/13/2015 0949 11/15/2015 1650 Full Code [id number]  Barnetta Chapel, MD Inpatient  Orders Placed This Encounter  Procedures   CBC with Differential (Cancer Center Only)    Standing Status:   Future    Expected Date:   08/31/2023    Expiration Date:   08/30/2024   CMP (Cancer Center only)    Standing Status:    Future    Expected Date:   08/31/2023    Expiration Date:   08/30/2024   CBC with Differential (Cancer Center Only)    Standing Status:   Future    Expected Date:   09/14/2023    Expiration Date:   09/13/2024   CMP (Cancer Center only)    Standing Status:   Future    Expected Date:   09/14/2023    Expiration Date:   09/13/2024   CBC with Differential (Cancer Center Only)    Standing Status:   Future    Expected Date:   09/28/2023    Expiration Date:   09/27/2024   CMP (Cancer Center only)    Standing Status:   Future    Expected Date:   09/28/2023    Expiration Date:   09/27/2024   CBC with Differential (Cancer Center Only)    Standing Status:   Future    Expected Date:   10/05/2023    Expiration Date:   10/04/2024   CMP (Cancer Center only)    Standing Status:   Future    Expected Date:   10/05/2023    Expiration Date:   10/04/2024     Future Appointments  Date Time Provider Department Center  08/31/2023 12:30 PM [facility] INFUSION [facility] None  09/02/2023  1:15 PM [facility] FLUSH [facility] None  09/12/2023 10:00 AM [facility] PHLEBOTOMIST [facility] None  09/12/2023 10:15 AM [facility] INFUSION [facility] None  09/12/2023 10:45 AM Garner Dullea, MD [facility] None  09/14/2023 11:00 AM [facility] INFUSION [facility] None  09/16/2023 12:30 PM [facility] FLUSH [facility] None  09/21/2023 11:00 AM Koerner, Cari M [facility] None  09/21/2023 12:00 PM [facility] LAB [facility] None      This document was completed utilizing speech recognition software. Grammatical errors, random word insertions, pronoun errors, and incomplete sentences are an occasional consequence of this system due to software limitations, ambient noise, and hardware issues. Any formal questions or concerns about the content, text or information contained within the body of this dictation should be directly addressed to the provider for clarification.

## 2023-08-29 NOTE — Progress Notes (Unsigned)
 Patient seen by Dr. Archie Patten Pasam today  Vitals are within treatment parameters:Yes   Labs are within treatment parameters: Yes   Treatment plan has been signed: Yes   Per physician team, Patient is ready for treatment and there are NO modifications to the treatment plan.

## 2023-08-29 NOTE — Addendum Note (Signed)
 Encounter addended by: Edward Qualia on: 08/29/2023 4:49 PM  Actions taken: Imaging Exam ended

## 2023-08-29 NOTE — Assessment & Plan Note (Signed)
 She experiences significant nausea following chemotherapy, particularly on the second and third days post-treatment, and has difficulty swallowing pill. The nausea has worsened with ongoing treatments. She has been advised to crush and mix Decadron and Compazine tablets to aid swallowing and to start Zofran the day after chemotherapy if nausea persists.   - Crush and mix Decadron and Compazine tablets to aid swallowing - Start Zofran the day after chemotherapy if nausea persists - Consider alternative anti-nausea medications if needed

## 2023-08-29 NOTE — Assessment & Plan Note (Addendum)
 This has resolved now.

## 2023-08-29 NOTE — Assessment & Plan Note (Addendum)
 Recently diagnosed with right breast mass (6 cm) and biopsy confirmed malignancy. Estrogen receptor weakly positive at 10%, progesterone receptor negative, and HER2 negative. Left breast has a small mass (6-8 mm) not concerning for malignancy. No lymph node involvement on biopsy but ultrasound was concerning for 3-4 right axillary lymph nodes. Inflammatory breast cancer.  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  -Previously I discussed staging, prognosis, plan of care, treatment options.  Reviewed NCCN guidelines.  On 06/13/2023, staging PET/CT showed intense metabolic activity within the right breast mass consistent with primary breast carcinoma.  Hypermetabolic right axillary and subpectoralis lymph nodes consistent with local nodal metastasis.  No evidence of distant metastatic disease.    Plan made to proceed with cycle 1, day 1 of carboplatin, paclitaxel and Keytruda on 07/04/2023. She received Keytruda first.  Later she started Taxol and few minutes into infusion, she had severe infusion reaction with chest pain, hypotension, diaphoresis, hypoxia.  EKG was unremarkable.  Blood pressure and O2 sats normalized after additional allergy medication and supplemental oxygen. We will not rechallenge her with Taxol. Plan made to switch treatments to Taxotere and cyclophosphamide regimen, to be given every 3 weeks for up to 6 cycles.  On 07/13/2023, she had infusion reaction to Taxotere as well, although not as severe as she had with Taxol.  She mainly had persistent lower back pain, increased blood pressure and some chest discomfort, which resolved quickly with pausing the infusion and Tylenol.  Plan made to switch treatments to dose dense AC, followed by weekly Abraxane. Started AC from 07/27/2023.  Received 2 cycles so far.  She already had significant improvement, including resolution of redness and pain. The current regimen is achieving the desired response.  Plan to continue for 2 more cycles  of dose dense AC.    With cycle 2, we had to dose reduce chemotherapy by approximately 20% because of previous cytopenias despite growth factor support.  Blood counts are currently within normal limits.  Will proceed with full strength of chemotherapy with cycle 3 on 08/31/2023.    Following completion of 4 cycles of dose dense AC, treatment will be switched to Abraxane weekly for up to 12 cycles.  Due to previous adverse reactions to chemotherapy, the upcoming switch to an nab-paclitaxel will be administered with caution.  Generally speaking, there is no cross-reactivity for nab-paclitaxel, anticipated to be well-tolerated due to its protein-bound nature.   Per patient preference, we will continue chemotherapy on Thursdays going forward, to accommodate her work schedule.

## 2023-08-31 ENCOUNTER — Ambulatory Visit: Payer: No Typology Code available for payment source

## 2023-08-31 ENCOUNTER — Inpatient Hospital Stay

## 2023-08-31 VITALS — BP 165/78 | HR 92 | Temp 98.5°F | Resp 18

## 2023-08-31 DIAGNOSIS — Z5111 Encounter for antineoplastic chemotherapy: Secondary | ICD-10-CM | POA: Diagnosis not present

## 2023-08-31 DIAGNOSIS — C50911 Malignant neoplasm of unspecified site of right female breast: Secondary | ICD-10-CM

## 2023-08-31 MED ORDER — DOXORUBICIN HCL CHEMO IV INJECTION 2 MG/ML
60.0000 mg/m2 | Freq: Once | INTRAVENOUS | Status: AC
  Administered 2023-08-31: 156 mg via INTRAVENOUS
  Filled 2023-08-31: qty 78

## 2023-08-31 MED ORDER — DEXAMETHASONE SODIUM PHOSPHATE 10 MG/ML IJ SOLN
10.0000 mg | Freq: Once | INTRAMUSCULAR | Status: AC
  Administered 2023-08-31: 10 mg via INTRAVENOUS
  Filled 2023-08-31: qty 1

## 2023-08-31 MED ORDER — PALONOSETRON HCL INJECTION 0.25 MG/5ML
0.2500 mg | Freq: Once | INTRAVENOUS | Status: AC
  Administered 2023-08-31: 0.25 mg via INTRAVENOUS
  Filled 2023-08-31: qty 5

## 2023-08-31 MED ORDER — SODIUM CHLORIDE 0.9% FLUSH
10.0000 mL | INTRAVENOUS | Status: DC | PRN
Start: 2023-08-31 — End: 2023-08-31
  Administered 2023-08-31: 10 mL

## 2023-08-31 MED ORDER — PROCHLORPERAZINE EDISYLATE 10 MG/2ML IJ SOLN
10.0000 mg | Freq: Once | INTRAMUSCULAR | Status: AC
  Administered 2023-08-31: 10 mg via INTRAVENOUS
  Filled 2023-08-31: qty 2

## 2023-08-31 MED ORDER — FAMOTIDINE IN NACL 20-0.9 MG/50ML-% IV SOLN
20.0000 mg | Freq: Once | INTRAVENOUS | Status: AC
  Administered 2023-08-31: 20 mg via INTRAVENOUS
  Filled 2023-08-31: qty 50

## 2023-08-31 MED ORDER — DIPHENHYDRAMINE HCL 50 MG/ML IJ SOLN
25.0000 mg | Freq: Once | INTRAMUSCULAR | Status: AC
  Administered 2023-08-31: 25 mg via INTRAVENOUS
  Filled 2023-08-31: qty 1

## 2023-08-31 MED ORDER — SODIUM CHLORIDE 0.9 % IV SOLN
600.0000 mg/m2 | Freq: Once | INTRAVENOUS | Status: AC
  Administered 2023-08-31: 1500 mg via INTRAVENOUS
  Filled 2023-08-31: qty 50

## 2023-08-31 MED ORDER — HEPARIN SOD (PORK) LOCK FLUSH 100 UNIT/ML IV SOLN
500.0000 [IU] | Freq: Once | INTRAVENOUS | Status: AC | PRN
  Administered 2023-08-31: 500 [IU]

## 2023-08-31 MED ORDER — SODIUM CHLORIDE 0.9 % IV SOLN: INTRAVENOUS | Status: DC

## 2023-08-31 NOTE — Patient Instructions (Signed)
 [facility]   Discharge Instructions: Thank you for choosing [facility] to provide your oncology and hematology care.   If you have a lab appointment with the Cancer Center, please go directly to the Cancer Center and check in at the registration area.   Wear comfortable clothing and clothing appropriate for easy access to any Portacath or PICC line.   We strive to give you quality time with your provider. You may need to reschedule your appointment if you arrive late (15 or more minutes).  Arriving late affects you and other patients whose appointments are after yours.  Also, if you miss three or more appointments without notifying the office, you may be dismissed from the clinic at the provider's discretion.      For prescription refill requests, have your pharmacy contact our office and allow 72 hours for refills to be completed.    Today you received the following chemotherapy and/or immunotherapy agents ADRIAMYCIN/CYTOXAN      To help prevent nausea and vomiting after your treatment, we encourage you to take your nausea medication as directed.  BELOW ARE SYMPTOMS THAT SHOULD BE REPORTED IMMEDIATELY: *FEVER GREATER THAN 100.4 F (38 C) OR HIGHER *CHILLS OR SWEATING *NAUSEA AND VOMITING THAT IS NOT CONTROLLED WITH YOUR NAUSEA MEDICATION *UNUSUAL SHORTNESS OF BREATH *UNUSUAL BRUISING OR BLEEDING *URINARY PROBLEMS (pain or burning when urinating, or frequent urination) *BOWEL PROBLEMS (unusual diarrhea, constipation, pain near the anus) TENDERNESS IN MOUTH AND THROAT WITH OR WITHOUT PRESENCE OF ULCERS (sore throat, sores in mouth, or a toothache) UNUSUAL RASH, SWELLING OR PAIN  UNUSUAL VAGINAL DISCHARGE OR ITCHING   Items with * indicate a potential emergency and should be followed up as soon as possible or go to the Emergency Department if any problems should occur.  Please show the CHEMOTHERAPY ALERT CARD or  IMMUNOTHERAPY ALERT CARD at check-in to the Emergency Department and triage nurse.  Should you have questions after your visit or need to cancel or reschedule your appointment, please contact [facility]  Dept: [phone]  and follow the prompts.  Office hours are 8:00 a.m. to 4:30 p.m. Monday - Friday. Please note that voicemails left after 4:00 p.m. may not be returned until the following business day.  We are closed weekends and major holidays. You have access to a nurse at all times for urgent questions. Please call the main number to the clinic Dept: [phone] and follow the prompts.   For any non-urgent questions, you may also contact your provider using MyChart. We now offer e-Visits for anyone 37 and older to request care online for non-urgent symptoms. For details visit [email or web address].   Also download the MyChart app! Go to the app store, search "MyChart", open the app, select [place], and log in with your MyChart username and password.  Doxorubicin Injection What is this medication? DOXORUBICIN (dox oh ROO bi sin) treats some types of cancer. It works by slowing down the growth of cancer cells. This medicine may be used for other purposes; ask your health care provider or pharmacist if you have questions. COMMON BRAND NAME(S): Adriamycin, Adriamycin PFS, Adriamycin RDF, Rubex What should I tell my care team before I take this medication? They need to know if you have any of these conditions: Heart disease History of low blood cell levels caused by a medication Liver disease Recent or ongoing  radiation An unusual or allergic reaction to doxorubicin, other medications, foods, dyes, or preservatives If you or your partner are pregnant or trying to get pregnant Breast-feeding How should I use this medication? This medication is injected into a vein. It is given by your care team in a hospital or clinic  setting. Talk to your care team about the use of this medication in children. Special care may be needed. Overdosage: If you think you have taken too much of this medicine contact a poison control center or emergency room at once. NOTE: This medicine is only for you. Do not share this medicine with others. What if I miss a dose? Keep appointments for follow-up doses. It is important not to miss your dose. Call your care team if you are unable to keep an appointment. What may interact with this medication? 6-mercaptopurine Paclitaxel Phenytoin St. John's wort Trastuzumab Verapamil This list may not describe all possible interactions. Give your health care provider a list of all the medicines, herbs, non-prescription drugs, or dietary supplements you use. Also tell them if you smoke, drink alcohol, or use illegal drugs. Some items may interact with your medicine. What should I watch for while using this medication? Your condition will be monitored carefully while you are receiving this medication. You may need blood work while taking this medication. This medication may make you feel generally unwell. This is not uncommon as chemotherapy can affect healthy cells as well as cancer cells. Report any side effects. Continue your course of treatment even though you feel ill unless your care team tells you to stop. There is a maximum amount of this medication you should receive throughout your life. The amount depends on the medical condition being treated and your overall health. Your care team will watch how much of this medication you receive. Tell your care team if you have taken this medication before. Your urine may turn red for a few days after your dose. This is not blood. If your urine is dark or brown, call your care team. In some cases, you may be given additional medications to help with side effects. Follow all directions for their use. This medication may increase your risk of getting an  infection. Call your care team for advice if you get a fever, chills, sore throat, or other symptoms of a cold or flu. Do not treat yourself. Try to avoid being around people who are sick. This medication may increase your risk to bruise or bleed. Call your care team if you notice any unusual bleeding. Talk to your care team about your risk of cancer. You may be more at risk for certain types of cancers if you take this medication. Talk to your care team if you or your partner may be pregnant. Serious birth defects can occur if you take this medication during pregnancy and for 6 months after the last dose. Contraception is recommended while taking this medication and for 6 months after the last dose. Your care team can help you find the option that works for you. If your partner can get pregnant, use a condom while taking this medication and for 6 months after the last dose. Do not breastfeed while taking this medication. This medication may cause infertility. Talk to your care team if you are concerned about your fertility. What side effects may I notice from receiving this medication? Side effects that you should report to your care team as soon as possible: Allergic reactions--skin rash, itching, hives, swelling of  the face, lips, tongue, or throat Heart failure--shortness of breath, swelling of the ankles, feet, or hands, sudden weight gain, unusual weakness or fatigue Heart rhythm changes--fast or irregular heartbeat, dizziness, feeling faint or lightheaded, chest pain, trouble breathing Infection--fever, chills, cough, sore throat, wounds that don't heal, pain or trouble when passing urine, general feeling of discomfort or being unwell Low red blood cell level--unusual weakness or fatigue, dizziness, headache, trouble breathing Painful swelling, warmth, or redness of the skin, blisters or sores at the infusion site Unusual bruising or bleeding Side effects that usually do not require medical  attention (report to your care team if they continue or are bothersome): Diarrhea Hair loss Nausea Pain, redness, or swelling with sores inside the mouth or throat Red urine This list may not describe all possible side effects. Call your doctor for medical advice about side effects. You may report side effects to FDA at 1-800-FDA-1088. Where should I keep my medication? This medication is given in a hospital or clinic. It will not be stored at home. NOTE: This sheet is a summary. It may not cover all possible information. If you have questions about this medicine, talk to your doctor, pharmacist, or health care provider.  2024 Elsevier/Gold Standard (2022-08-25 00:00:00) Cyclophosphamide Injection What is this medication? CYCLOPHOSPHAMIDE (sye kloe FOSS fa mide) treats some types of cancer. It works by slowing down the growth of cancer cells. This medicine may be used for other purposes; ask your health care provider or pharmacist if you have questions. COMMON BRAND NAME(S): Cyclophosphamide, Cytoxan, Neosar What should I tell my care team before I take this medication? They need to know if you have any of these conditions: Heart disease Irregular heartbeat or rhythm Infection Kidney problems Liver disease Low blood cell levels (white cells, platelets, or red blood cells) Lung disease Previous radiation Trouble passing urine An unusual or allergic reaction to cyclophosphamide, other medications, foods, dyes, or preservatives Pregnant or trying to get pregnant Breast-feeding How should I use this medication? This medication is injected into a vein. It is given by your care team in a hospital or clinic setting. Talk to your care team about the use of this medication in children. Special care may be needed. Overdosage: If you think you have taken too much of this medicine contact a poison control center or emergency room at once. NOTE: This medicine is only for you. Do not share this  medicine with others. What if I miss a dose? Keep appointments for follow-up doses. It is important not to miss your dose. Call your care team if you are unable to keep an appointment. What may interact with this medication? Amphotericin B Amiodarone Azathioprine Certain antivirals for HIV or hepatitis Certain medications for blood pressure, such as enalapril, lisinopril, quinapril Cyclosporine Diuretics Etanercept Indomethacin Medications that relax muscles Metronidazole Natalizumab Tamoxifen Warfarin This list may not describe all possible interactions. Give your health care provider a list of all the medicines, herbs, non-prescription drugs, or dietary supplements you use. Also tell them if you smoke, drink alcohol, or use illegal drugs. Some items may interact with your medicine. What should I watch for while using this medication? This medication may make you feel generally unwell. This is not uncommon as chemotherapy can affect healthy cells as well as cancer cells. Report any side effects. Continue your course of treatment even though you feel ill unless your care team tells you to stop. You may need blood work while you are taking this medication. This  medication may increase your risk of getting an infection. Call your care team for advice if you get a fever, chills, sore throat, or other symptoms of a cold or flu. Do not treat yourself. Try to avoid being around people who are sick. Avoid taking medications that contain aspirin, acetaminophen, ibuprofen, naproxen, or ketoprofen unless instructed by your care team. These medications may hide a fever. Be careful brushing or flossing your teeth or using a toothpick because you may get an infection or bleed more easily. If you have any dental work done, tell your dentist you are receiving this medication. Drink water or other fluids as directed. Urinate often, even at night. Some products may contain alcohol. Ask your care team if  this medication contains alcohol. Be sure to tell all care teams you are taking this medicine. Certain medicines, like metronidazole and disulfiram, can cause an unpleasant reaction when taken with alcohol. The reaction includes flushing, headache, nausea, vomiting, sweating, and increased thirst. The reaction can last from 30 minutes to several hours. Talk to your care team if you wish to become pregnant or think you might be pregnant. This medication can cause serious birth defects if taken during pregnancy and for 1 year after the last dose. A negative pregnancy test is required before starting this medication. A reliable form of contraception is recommended while taking this medication and for 1 year after the last dose. Talk to your care team about reliable forms of contraception. Do not father a child while taking this medication and for 4 months after the last dose. Use a condom during this time period. Do not breast-feed while taking this medication or for 1 week after the last dose. This medication may cause infertility. Talk to your care team if you are concerned about your fertility. Talk to your care team about your risk of cancer. You may be more at risk for certain types of cancer if you take this medication. What side effects may I notice from receiving this medication? Side effects that you should report to your care team as soon as possible: Allergic reactions--skin rash, itching, hives, swelling of the face, lips, tongue, or throat Dry cough, shortness of breath or trouble breathing Heart failure--shortness of breath, swelling of the ankles, feet, or hands, sudden weight gain, unusual weakness or fatigue Heart muscle inflammation--unusual weakness or fatigue, shortness of breath, chest pain, fast or irregular heartbeat, dizziness, swelling of the ankles, feet, or hands Heart rhythm changes--fast or irregular heartbeat, dizziness, feeling faint or lightheaded, chest pain, trouble  breathing Infection--fever, chills, cough, sore throat, wounds that don't heal, pain or trouble when passing urine, general feeling of discomfort or being unwell Kidney injury--decrease in the amount of urine, swelling of the ankles, hands, or feet Liver injury--right upper belly pain, loss of appetite, nausea, light-colored stool, dark yellow or brown urine, yellowing skin or eyes, unusual weakness or fatigue Low red blood cell level--unusual weakness or fatigue, dizziness, headache, trouble breathing Low sodium level--muscle weakness, fatigue, dizziness, headache, confusion Red or dark brown urine Unusual bruising or bleeding Side effects that usually do not require medical attention (report to your care team if they continue or are bothersome): Hair loss Irregular menstrual cycles or spotting Loss of appetite Nausea Pain, redness, or swelling with sores inside the mouth or throat Vomiting This list may not describe all possible side effects. Call your doctor for medical advice about side effects. You may report side effects to FDA at 1-800-FDA-1088. Where should I keep  my medication? This medication is given in a hospital or clinic. It will not be stored at home. NOTE: This sheet is a summary. It may not cover all possible information. If you have questions about this medicine, talk to your doctor, pharmacist, or health care provider.  2024 Elsevier/Gold Standard (2021-10-08 00:00:00)

## 2023-09-02 ENCOUNTER — Other Ambulatory Visit: Payer: Self-pay

## 2023-09-02 ENCOUNTER — Inpatient Hospital Stay

## 2023-09-02 VITALS — BP 197/101 | HR 87 | Temp 98.6°F | Resp 21

## 2023-09-02 DIAGNOSIS — C50911 Malignant neoplasm of unspecified site of right female breast: Secondary | ICD-10-CM

## 2023-09-02 DIAGNOSIS — Z5111 Encounter for antineoplastic chemotherapy: Secondary | ICD-10-CM | POA: Diagnosis not present

## 2023-09-02 MED ORDER — PEGFILGRASTIM-CBQV 6 MG/0.6ML ~~LOC~~ SOSY
6.0000 mg | PREFILLED_SYRINGE | Freq: Once | SUBCUTANEOUS | Status: AC
  Administered 2023-09-02: 6 mg via SUBCUTANEOUS

## 2023-09-02 NOTE — Patient Instructions (Signed)

## 2023-09-03 ENCOUNTER — Other Ambulatory Visit: Payer: Self-pay

## 2023-09-05 ENCOUNTER — Ambulatory Visit: Payer: BLUE CROSS/BLUE SHIELD | Admitting: Oncology

## 2023-09-05 ENCOUNTER — Other Ambulatory Visit: Payer: BLUE CROSS/BLUE SHIELD

## 2023-09-07 ENCOUNTER — Ambulatory Visit: Payer: BLUE CROSS/BLUE SHIELD

## 2023-09-12 ENCOUNTER — Inpatient Hospital Stay

## 2023-09-12 ENCOUNTER — Inpatient Hospital Stay: Attending: Oncology

## 2023-09-12 ENCOUNTER — Inpatient Hospital Stay (HOSPITAL_BASED_OUTPATIENT_CLINIC_OR_DEPARTMENT_OTHER): Admitting: Oncology

## 2023-09-12 ENCOUNTER — Encounter: Payer: Self-pay | Admitting: Oncology

## 2023-09-12 ENCOUNTER — Other Ambulatory Visit: Payer: Self-pay

## 2023-09-12 VITALS — BP 165/89 | HR 88 | Temp 98.2°F | Resp 16 | Ht 66.0 in | Wt 294.0 lb

## 2023-09-12 DIAGNOSIS — Z452 Encounter for adjustment and management of vascular access device: Secondary | ICD-10-CM | POA: Diagnosis not present

## 2023-09-12 DIAGNOSIS — J069 Acute upper respiratory infection, unspecified: Secondary | ICD-10-CM

## 2023-09-12 DIAGNOSIS — J029 Acute pharyngitis, unspecified: Secondary | ICD-10-CM

## 2023-09-12 DIAGNOSIS — T451X5A Adverse effect of antineoplastic and immunosuppressive drugs, initial encounter: Secondary | ICD-10-CM

## 2023-09-12 DIAGNOSIS — Z5189 Encounter for other specified aftercare: Secondary | ICD-10-CM | POA: Insufficient documentation

## 2023-09-12 DIAGNOSIS — R239 Unspecified skin changes: Secondary | ICD-10-CM | POA: Insufficient documentation

## 2023-09-12 DIAGNOSIS — Z170 Estrogen receptor positive status [ER+]: Secondary | ICD-10-CM | POA: Diagnosis not present

## 2023-09-12 DIAGNOSIS — C50911 Malignant neoplasm of unspecified site of right female breast: Secondary | ICD-10-CM

## 2023-09-12 DIAGNOSIS — C50411 Malignant neoplasm of upper-outer quadrant of right female breast: Secondary | ICD-10-CM | POA: Insufficient documentation

## 2023-09-12 DIAGNOSIS — Z5111 Encounter for antineoplastic chemotherapy: Secondary | ICD-10-CM | POA: Insufficient documentation

## 2023-09-12 DIAGNOSIS — Z95828 Presence of other vascular implants and grafts: Secondary | ICD-10-CM

## 2023-09-12 LAB — CBC WITH DIFFERENTIAL (CANCER CENTER ONLY)
Abs Immature Granulocytes: 1.35 10*3/uL — ABNORMAL HIGH (ref 0.00–0.07)
Basophils Absolute: 0.1 10*3/uL (ref 0.0–0.1)
Basophils Relative: 1 %
Eosinophils Absolute: 0 10*3/uL (ref 0.0–0.5)
Eosinophils Relative: 0 %
HCT: 32.7 % — ABNORMAL LOW (ref 36.0–46.0)
Hemoglobin: 10.6 g/dL — ABNORMAL LOW (ref 12.0–15.0)
Immature Granulocytes: 18 %
Lymphocytes Relative: 9 %
Lymphs Abs: 0.6 10*3/uL — ABNORMAL LOW (ref 0.7–4.0)
MCH: 27.5 pg (ref 26.0–34.0)
MCHC: 32.4 g/dL (ref 30.0–36.0)
MCV: 84.7 fL (ref 80.0–100.0)
Monocytes Absolute: 1 10*3/uL (ref 0.1–1.0)
Monocytes Relative: 13 %
Neutro Abs: 4.5 10*3/uL (ref 1.7–7.7)
Neutrophils Relative %: 59 %
Platelet Count: 202 10*3/uL (ref 150–400)
RBC: 3.86 MIL/uL — ABNORMAL LOW (ref 3.87–5.11)
RDW: 15.7 % — ABNORMAL HIGH (ref 11.5–15.5)
Smear Review: ADEQUATE
WBC Count: 7.5 10*3/uL (ref 4.0–10.5)
nRBC: 0.8 % — ABNORMAL HIGH (ref 0.0–0.2)

## 2023-09-12 LAB — CMP (CANCER CENTER ONLY)
ALT: 21 U/L (ref 0–44)
AST: 20 U/L (ref 15–41)
Albumin: 4 g/dL (ref 3.5–5.0)
Alkaline Phosphatase: 90 U/L (ref 38–126)
Anion gap: 8 (ref 5–15)
BUN: 7 mg/dL (ref 6–20)
CO2: 26 mmol/L (ref 22–32)
Calcium: 8.7 mg/dL — ABNORMAL LOW (ref 8.9–10.3)
Chloride: 104 mmol/L (ref 98–111)
Creatinine: 1.01 mg/dL — ABNORMAL HIGH (ref 0.44–1.00)
GFR, Estimated: >60 mL/min (ref >60)
Glucose, Bld: 110 mg/dL — ABNORMAL HIGH (ref 70–99)
Potassium: 3.6 mmol/L (ref 3.5–5.1)
Sodium: 138 mmol/L (ref 135–145)
Total Bilirubin: 0.3 mg/dL (ref 0.0–1.2)
Total Protein: 7.3 g/dL (ref 6.5–8.1)

## 2023-09-12 MED ORDER — AZITHROMYCIN 250 MG PO TABS: ORAL_TABLET | ORAL | 0 refills | Status: AC

## 2023-09-12 MED ORDER — SODIUM CHLORIDE 0.9% FLUSH
10.0000 mL | INTRAVENOUS | Status: DC | PRN
Start: 2023-09-12 — End: 2023-09-12
  Administered 2023-09-12: 10 mL via INTRAVENOUS

## 2023-09-12 MED ORDER — GUAIFENESIN-CODEINE 200-10 MG/5ML PO LIQD: 5.0000 mL | Freq: Three times a day (TID) | ORAL | 0 refills | Status: DC | PRN

## 2023-09-12 MED ORDER — HEPARIN SOD (PORK) LOCK FLUSH 100 UNIT/ML IV SOLN
500.0000 [IU] | Freq: Once | INTRAVENOUS | Status: AC
  Administered 2023-09-12: 500 [IU] via INTRAVENOUS

## 2023-09-12 NOTE — Assessment & Plan Note (Signed)
 Reports darkening of the skin on the fingers, likely a side effect of chemotherapy. No associated pain, and using moisturizer to manage the condition. - Advise continued use of moisturizer on hands and feet.

## 2023-09-12 NOTE — Progress Notes (Unsigned)
 Patient seen by Dr. Archie Patten Pasam today  Vitals are within treatment parameters:Yes   Labs are within treatment parameters: Yes   Treatment plan has been signed: Yes   Per physician team, Patient will not be receiving treatment today.  She has sorethroat and cough with sputum production. She is barely able to speak. We will have to hold off on chemo this week. I will see her next Tuesday for labs, OV and plan for chemo next Thursday 09/21/23.

## 2023-09-12 NOTE — Assessment & Plan Note (Addendum)
 Recently diagnosed with right breast mass (6 cm) and biopsy confirmed malignancy. Estrogen receptor weakly positive at 10%, progesterone receptor negative, and HER2 negative. Left breast has a small mass (6-8 mm) not concerning for malignancy. No lymph node involvement on biopsy but ultrasound was concerning for 3-4 right axillary lymph nodes. Inflammatory breast cancer.  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  -Previously I discussed staging, prognosis, plan of care, treatment options.  Reviewed NCCN guidelines.  On 06/13/2023, staging PET/CT showed intense metabolic activity within the right breast mass consistent with primary breast carcinoma.  Hypermetabolic right axillary and subpectoralis lymph nodes consistent with local nodal metastasis.  No evidence of distant metastatic disease.    Plan made to proceed with cycle 1, day 1 of carboplatin, paclitaxel and Keytruda on 07/04/2023. She received Keytruda first.  Later she started Taxol and few minutes into infusion, she had severe infusion reaction with chest pain, hypotension, diaphoresis, hypoxia.  EKG was unremarkable.  Blood pressure and O2 sats normalized after additional allergy medication and supplemental oxygen. We will not rechallenge her with Taxol. Plan made to switch treatments to Taxotere and cyclophosphamide regimen, to be given every 3 weeks for up to 6 cycles.  On 07/13/2023, she had infusion reaction to Taxotere as well, although not as severe as she had with Taxol.  She mainly had persistent lower back pain, increased blood pressure and some chest discomfort, which resolved quickly with pausing the infusion and Tylenol.  Plan made to switch treatments to dose dense AC, followed by weekly Abraxane. Started AC from 07/27/2023.  Received 3 cycles so far.  She already had significant improvement, including resolution of redness and pain. The current regimen is achieving the desired response.  Plan to continue for 1 more cycle  of dose dense AC.    With cycle 2, we had to dose reduce chemotherapy by approximately 20% because of previous cytopenias despite growth factor support.  Blood counts are currently within normal limits.  We proceeded with full strength of chemotherapy with cycle 3 on 08/31/2023.    Due for cycle 4 of chemotherapy today.  However given upper respiratory infection and debilitating symptoms from it, we will defer chemotherapy this week and reevaluate in 1 week for possible resumption of chemotherapy.  Labs looks grossly unremarkable.  Following completion of 4 cycles of dose dense AC, treatment will be switched to Abraxane weekly for up to 12 cycles.  Due to previous adverse reactions to chemotherapy, the upcoming switch to an nab-paclitaxel will be administered with caution.  Generally speaking, there is no cross-reactivity for nab-paclitaxel, anticipated to be well-tolerated due to its protein-bound nature.   Per patient preference, we will continue chemotherapy on Thursdays going forward, to accommodate her work schedule.

## 2023-09-12 NOTE — Progress Notes (Signed)
 [facility] CSW Progress Note  Visual merchandiser met with patient during infusion and in provider room to assess needs. Patient is utilizing white board for temp communication. Patient informed CSW she will bring in [facility] application and Ages paperwork to drop off at front desk.   Marguerita Merles, LCSW Clinical Social Worker [facility]

## 2023-09-12 NOTE — Progress Notes (Signed)
 [facility]  ONCOLOGY CLINIC PROGRESS NOTE   Patient Care Team: Carolin Coy as PCP - General (Physician Assistant)  PATIENT NAME: Jasmine Novak   MR#: [id number] DOB: 1970-09-02  Date of visit: 09/12/2023   ASSESSMENT & PLAN:   Jasmine Novak is a 53 y.o. lady with a past medical history of hypertension, was referred to our clinic in December 2024 for newly diagnosed inflammatory carcinoma of right breast.  ER weakly positive at 10%, PR negative, HER2/neu negative.  Clinically behaving like triple negative breast cancer.  Had severe infusion reaction to first dose of Taxol and later moderate reaction to Taxotere.  Treatment switched to to dose dense AC, to be followed by weekly Abraxane.  Inflammatory carcinoma of breast, right (HCC) Recently diagnosed with right breast mass (6 cm) and biopsy confirmed malignancy. Estrogen receptor weakly positive at 10%, progesterone receptor negative, and HER2 negative. Left breast has a small mass (6-8 mm) not concerning for malignancy. No lymph node involvement on biopsy but ultrasound was concerning for 3-4 right axillary lymph nodes. Inflammatory breast cancer.  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  -Previously I discussed staging, prognosis, plan of care, treatment options.  Reviewed NCCN guidelines.  On 06/13/2023, staging PET/CT showed intense metabolic activity within the right breast mass consistent with primary breast carcinoma.  Hypermetabolic right axillary and subpectoralis lymph nodes consistent with local nodal metastasis.  No evidence of distant metastatic disease.    Plan made to proceed with cycle 1, day 1 of carboplatin, paclitaxel and Keytruda on 07/04/2023. She received Keytruda first.  Later she started Taxol and few minutes into infusion, she had severe infusion reaction with chest pain, hypotension, diaphoresis, hypoxia.  EKG was unremarkable.  Blood pressure and O2 sats normalized after  additional allergy medication and supplemental oxygen. We will not rechallenge her with Taxol. Plan made to switch treatments to Taxotere and cyclophosphamide regimen, to be given every 3 weeks for up to 6 cycles.  On 07/13/2023, she had infusion reaction to Taxotere as well, although not as severe as she had with Taxol.  She mainly had persistent lower back pain, increased blood pressure and some chest discomfort, which resolved quickly with pausing the infusion and Tylenol.  Plan made to switch treatments to dose dense AC, followed by weekly Abraxane. Started AC from 07/27/2023.  Received 3 cycles so far.  She already had significant improvement, including resolution of redness and pain. The current regimen is achieving the desired response.  Plan to continue for 1 more cycle of dose dense AC.    With cycle 2, we had to dose reduce chemotherapy by approximately 20% because of previous cytopenias despite growth factor support.  Blood counts are currently within normal limits.  We proceeded with full strength of chemotherapy with cycle 3 on 08/31/2023.    Due for cycle 4 of chemotherapy today.  However given upper respiratory infection and debilitating symptoms from it, we will defer chemotherapy this week and reevaluate in 1 week for possible resumption of chemotherapy.  Labs looks grossly unremarkable.  Following completion of 4 cycles of dose dense AC, treatment will be switched to Abraxane weekly for up to 12 cycles.  Due to previous adverse reactions to chemotherapy, the upcoming switch to an nab-paclitaxel will be administered with caution.  Generally speaking, there is no cross-reactivity for nab-paclitaxel, anticipated to be well-tolerated due to its protein-bound nature.   Per patient preference, we will continue chemotherapy on Thursdays  going forward, to accommodate her work schedule.  Upper respiratory infection Presents with symptoms suggestive of an upper respiratory tract infection,  including yellow mucus, altered taste, and congestion. No fever or hemoptysis. Likely bacterial, but a viral etiology cannot be ruled out. Currently undergoing chemotherapy, which necessitates caution as it can exacerbate infections. - Prescribe azithromycin (Z-Pak) with dosing instructions: two tablets on day one, followed by one tablet daily for four more days. - Advise use of steam inhalation to alleviate congestion. - Prescribe codeine syrup for cough management, with instructions to contact the clinic if there are issues with the prescription. - Instruct to use mouthwash with numbing medicine to help with oral discomfort. - Advise to visit urgent care for a swab if symptoms do not improve by Friday.  Skin changes related to chemotherapy Reports darkening of the skin on the fingers, likely a side effect of chemotherapy. No associated pain, and using moisturizer to manage the condition. - Advise continued use of moisturizer on hands and feet.   I reviewed lab results and outside records for this visit and discussed relevant results with the patient. Diagnosis, plan of care and treatment options were also discussed in detail with the patient. Opportunity provided to ask questions and answers provided to her apparent satisfaction. Provided instructions to call our clinic with any problems, questions or concerns prior to return visit. I recommended to continue follow-up with PCP and sub-specialists. She verbalized understanding and agreed with the plan.   NCCN guidelines have been consulted in the planning of this patient's care.  I spent a total of 40 minutes during this encounter with the patient including review of chart and various tests results, discussions about plan of care and coordination of care plan.  Meryl Crutch, MD  09/12/2023 2:12 PM  [facility] - A DEPT OF [facility] [address] 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  Current Treatment: Plan made initially for neoadjuvant chemotherapy using KEYNOTE-522 protocol (carboplatin and paclitaxel plus Keytruda for 4 cycles followed by Adriamycin, Cytoxan and Keytruda for 4 cycles followed by 9 more doses of adjuvant Keytruda).  Started cycle 1 of chemotherapy on 07/04/2023.  She received Keytruda first.  Later she started Taxol and few minutes into infusion, she had severe infusion reaction with chest pain, hypotension, diaphoresis, hypoxia.  EKG was unremarkable.  Blood pressure and O2 sats normalized after additional allergy medication and supplemental oxygen.  We will not rechallenge her with Taxol.  Plan made to switch treatments to Taxotere and cyclophosphamide regimen, to be given every 3 weeks for up to 6 cycles.  He had moderate reaction to Taxotere as well.  Hence plan made to switch treatments to [facility] regimen in a dose dense fashion for up to 4 cycles, starting from 07/27/2023.  This will be followed by Abraxane weekly.  INTERVAL HISTORY:    Discussed the use of AI scribe software for clinical note transcription with the patient, who gave verbal consent to proceed.   Jasmine Novak is here today for repeat clinical assessment.   She is experiencing upper respiratory symptoms, including rhinorrhea with clear mucus that turned yellow yesterday. No fever, hemoptysis, or sinus pressure congestion. She is using over-the-counter medications and cough drops for symptom  relief. Her taste is altered, but she can swallow without difficulty. She is maintaining hydration. She has not yet obtained the prescribed mouthwash from her pharmacy.  She notes skin changes, specifically hyperpigmentation, which is a new occurrence since  her last chemotherapy session. No pain in her fingers. She is using a moisturizer on her hands and feet, and reports improvement in her condition. She has not experienced these symptoms with previous chemotherapy treatments.  She is currently working in a daycare and has not mentioned any sick contacts among her children. Due to the current upper respiratory infection, her chemotherapy treatment is delayed by one week to prevent exacerbation of the infection.  I have reviewed the past medical history, past surgical history, social history and family history with the patient and they are unchanged from previous note.  HISTORY OF PRESENT ILLNESS:   ONCOLOGY HISTORY:  The patient has not been getting regular mammograms previously and has not seen a primary doctor recently.    She reports lump in her right breast that she noticed in mid-October 2024. The lump was causing discomfort, particularly when she is moving or wearing a bra. The patient reports that the area around the lump is tender and warm to the touch. She has been taking ibuprofen to manage the discomfort. She is right-handed and has been trying to avoid using her right arm due to the discomfort from the lump.    Since pain and swelling persisted, she presented to the ED on 05/07/2023.  A CT chest with contrast was obtained on 05/07/2023 which showed 5 x 4.2 cm mass within the right breast with diffuse cutaneous thickening overlying the right breast, highly suspicious for primary breast malignancy.  Right axillary lymphadenopathy measuring up to 13 mm, suspicious for metastatic disease.  A 3 mm subpleural right upper lobe lung nodule, indeterminate. Coronary artery calcifications and left atrial enlargement was also noted.   ED provider reached out to our clinic for further assistance and management.  We arranged for diagnostic mammogram, ultrasound and biopsy.   On 05/22/2023, diagnostic mammogram showed 5.3 cm highly suspicious mass in the  right breast associated with adjacent inflammation and overlying skin thickening consistent with inflammatory breast carcinoma.  3-4 abnormal right axillary lymph nodes, suspicious for metastatic disease.  2 adjacent benign clusters of cysts in the left breast.  No evidence of left breast malignancy.  Ultrasound showed similar findings.   On 05/23/2023 she underwent core needle biopsy of right breast mass.  Pathology showed invasive ductal carcinoma, mitotic score 3, grade 3.  ER weakly positive at 10%.  PR negative, HER2/neu was 2+ by IHC, negative by FISH.  High Ki-67 index of 80%.   With these findings, she presented to clinic to establish care with [place] on 06/06/2023.  Request submitted for PET/CT and echocardiogram.   On 06/13/2023, staging PET/CT showed intense metabolic activity within the right breast mass consistent with primary breast carcinoma.  Hypermetabolic right axillary and subpectoralis lymph nodes consistent with local nodal metastasis.  No evidence of distant metastatic disease.    Given no evidence of metastatic disease, plan is to treat her with neoadjuvant chemoimmunotherapy using KEYNOTE-522 protocol (carboplatin and paclitaxel plus Keytruda for 4 cycles followed by Adriamycin, Cytoxan and Keytruda for 4 cycles followed by 9 more doses of adjuvant Keytruda).  Started systemic treatments from 07/04/2023.  Started cycle 1 of chemotherapy on 07/04/2023.  She received Keytruda first.  Later she started Taxol and few minutes into infusion, she had severe  infusion reaction with chest pain, hypotension, diaphoresis, hypoxia.  EKG was unremarkable.  Blood pressure and O2 sats normalized after additional allergy medication and supplemental oxygen.   We will not rechallenge her with Taxol.   Plan made to switch treatments to Taxotere and cyclophosphamide regimen, to be given every 3 weeks for up to 6 cycles.  On 07/13/2023, she had infusion reaction to Taxotere as well, although not as severe as  she had with Taxol.  She mainly had persistent lower back pain, increased blood pressure and some chest discomfort, which resolved quickly with pausing the infusion and Tylenol.   Plan made to switch treatments to dose dense AC, followed by weekly Abraxane.  Started this from 07/27/2023.   Oncology History  Inflammatory carcinoma of breast, right (HCC)  06/08/2023 Initial Diagnosis   Inflammatory carcinoma of breast, right (HCC)   06/15/2023 Cancer Staging   Staging form: Breast, AJCC 8th Edition - Clinical: Stage IIIC (cT4b, cN2, cM0, G3, ER+, PR-, HER2-) - Signed by Meryl Crutch, MD on 06/15/2023 Histologic grading system: 3 grade system   07/04/2023 - 07/04/2023 Chemotherapy   Patient is on Treatment Plan : BREAST Pembrolizumab (200) D1 + Carboplatin (5) D1 + Paclitaxel (80) D1,8,15 q21d X 4 cycles / Pembrolizumab (200) D1 + AC D1 q21d x 4 cycles     07/13/2023 - 07/14/2023 Chemotherapy   Patient is on Treatment Plan : BREAST TC q21d     07/27/2023 -  Chemotherapy   Patient is on Treatment Plan : BREAST DOSE DENSE AC q14d / Nab-Paclitaxel (Abraxane) q7d         REVIEW OF SYSTEMS:   Review of Systems  Constitutional:  Positive for unexpected weight change.  HENT:   Positive for sore throat and trouble swallowing.        Black spots on tongue  Respiratory:  Positive for cough (yellow sputum).   Cardiovascular:  Positive for leg swelling.    All other pertinent systems were reviewed with the patient and are negative.  ALLERGIES: She is allergic to paclitaxel, docetaxel, lisinopril, and fosaprepitant.  MEDICATIONS:  Current Outpatient Medications  Medication Sig Dispense Refill   amLODipine (NORVASC) 10 MG tablet Take 1 tablet (10 mg total) by mouth daily. 90 tablet 0   azithromycin (ZITHROMAX) 250 MG tablet Take 2 tablets by mouth once on day 1, then 1 tablet daily until complete 6 each 0   carvedilol (COREG) 12.5 MG tablet Take 12.5 mg by mouth.     furosemide (LASIX) 40 MG tablet  TAKE 1 TABLET(40 MG) BY MOUTH DAILY 30 tablet 2   guaiFENesin-Codeine 200-10 MG/5ML LIQD Take 5 mLs by mouth 3 (three) times daily as needed (Cough). 500 mL 0   irbesartan (AVAPRO) 300 MG tablet Take by mouth.     loratadine (CLARITIN REDITABS) 10 MG dissolvable tablet Take 1 tablet (10 mg total) by mouth daily. 30 tablet 1   ondansetron (ZOFRAN-ODT) 8 MG disintegrating tablet Take 1 tablet (8 mg total) by mouth every 8 (eight) hours as needed for nausea or vomiting. 30 tablet 1   spironolactone (ALDACTONE) 25 MG tablet Take by mouth.     ibuprofen (ADVIL) 200 MG tablet Take 200 mg by mouth every 6 (six) hours as needed. (Patient not taking: Reported on 08/03/2023)     loperamide (IMODIUM) 2 MG capsule Take 1-2 capsules (2-4 mg total) by mouth as needed for diarrhea or loose stools. (Patient not taking: Reported on 08/17/2023) 30 capsule 1   magic mouthwash (  nystatin, hydrocortisone, diphenhydrAMINE, lidocaine) suspension Swish and swallow 5 mLs 3 (three) times daily as needed for mouth pain. (Patient not taking: Reported on 09/12/2023) 500 mL 1   magic mouthwash (lidocaine, diphenhydrAMINE, alum & mag hydroxide) suspension Swish and swallow 5 mLs by mouth 3 (three) times daily as needed for mouth pain. (Patient not taking: Reported on 09/12/2023) 150 mL 1   traMADol (ULTRAM) 50 MG tablet Take 1 tablet (50 mg total) by mouth every 6 (six) hours as needed. (Patient not taking: Reported on 08/03/2023) 90 tablet 0   No current facility-administered medications for this visit.     VITALS:   Blood pressure (!) 165/89, pulse 88, temperature 98.2 F (36.8 C), temperature source Temporal, resp. rate 16, height 5\' 6"  (1.676 m), weight 294 lb (133.4 kg), last menstrual period 01/17/2022, SpO2 98%.  Wt Readings from Last 3 Encounters:  09/12/23 294 lb (133.4 kg)  08/29/23 (!) 308 lb 9.6 oz (140 kg)  08/17/23 (!) 314 lb 9.6 oz (142.7 kg)    Body mass index is 47.45 kg/m.   Onc Performance Status - 09/12/23  1057       ECOG Perf Status   ECOG Perf Status Restricted in physically strenuous activity but ambulatory and able to carry out work of a light or sedentary nature, e.g., light house work, office work      KPS SCALE   KPS % SCORE Able to carry on normal activity, minor s/s of disease                PHYSICAL EXAM:   Physical Exam Constitutional:      General: She is not in acute distress.    Appearance: Normal appearance.  HENT:     Head: Normocephalic and atraumatic.     Mouth/Throat:     Comments: Scattered dark pigmentation on the tongue.  No definite signs of infection.  No thrush. Eyes:     General: No scleral icterus.    Conjunctiva/sclera: Conjunctivae normal.  Cardiovascular:     Rate and Rhythm: Normal rate and regular rhythm.     Heart sounds: Normal heart sounds.  Pulmonary:     Effort: Pulmonary effort is normal.     Breath sounds: Normal breath sounds.  Chest:     Comments: Breast exam deferred today.  Last exam from 08/29/2023 is as mentioned:  Previously noted erythema of the skin on the right breast has resolved.  No definite palpable lump at this time.  Scattered nodularity in the right breast, much improved compared to prior.  Overall excellent clinical response. Abdominal:     General: There is no distension.  Musculoskeletal:     Right lower leg: Edema present.     Left lower leg: Edema present.  Neurological:     General: No focal deficit present.     Mental Status: She is alert and oriented to person, place, and time.  Psychiatric:        Mood and Affect: Mood normal.        Behavior: Behavior normal.        Thought Content: Thought content normal.      LABORATORY DATA:   I have reviewed the data as listed.  Results for orders placed or performed in visit on 09/12/23  CMP (Cancer Center only)  Result Value Ref Range   Sodium 138 135 - 145 mmol/L   Potassium 3.6 3.5 - 5.1 mmol/L   Chloride 104 98 - 111 mmol/L   CO2  26 22 - 32 mmol/L    Glucose, Bld 110 (H) 70 - 99 mg/dL   BUN 7 6 - 20 mg/dL   Creatinine 0.86 (H) 5.78 - 1.00 mg/dL   Calcium 8.7 (L) 8.9 - 10.3 mg/dL   Total Protein 7.3 6.5 - 8.1 g/dL   Albumin 4.0 3.5 - 5.0 g/dL   AST 20 15 - 41 U/L   ALT 21 0 - 44 U/L   Alkaline Phosphatase 90 38 - 126 U/L   Total Bilirubin 0.3 0.0 - 1.2 mg/dL   GFR, Estimated >46 >96 mL/min   Anion gap 8 5 - 15  CBC with Differential (Cancer Center Only)  Result Value Ref Range   WBC Count 7.5 4.0 - 10.5 K/uL   RBC 3.86 (L) 3.87 - 5.11 MIL/uL   Hemoglobin 10.6 (L) 12.0 - 15.0 g/dL   HCT 29.5 (L) 28.4 - 13.2 %   MCV 84.7 80.0 - 100.0 fL   MCH 27.5 26.0 - 34.0 pg   MCHC 32.4 30.0 - 36.0 g/dL   RDW 44.0 (H) 10.2 - 72.5 %   Platelet Count 202 150 - 400 K/uL   nRBC 0.8 (H) 0.0 - 0.2 %   Neutrophils Relative % 59 %   Neutro Abs 4.5 1.7 - 7.7 K/uL   Lymphocytes Relative 9 %   Lymphs Abs 0.6 (L) 0.7 - 4.0 K/uL   Monocytes Relative 13 %   Monocytes Absolute 1.0 0.1 - 1.0 K/uL   Eosinophils Relative 0 %   Eosinophils Absolute 0.0 0.0 - 0.5 K/uL   Basophils Relative 1 %   Basophils Absolute 0.1 0.0 - 0.1 K/uL   WBC Morphology Mild Left Shift (1-5% metas, occ myelo)    Smear Review PLATELETS APPEAR ADEQUATE    Immature Granulocytes 18 %   Abs Immature Granulocytes 1.35 (H) 0.00 - 0.07 K/uL   Tear Drop Cells PRESENT    Stomatocytes PRESENT    Giant PLTs PRESENT        RADIOGRAPHIC STUDIES:  I have personally reviewed the radiological images as listed and agree with the findings in the report.  US Venous Img Lower Bilateral (DVT) Result Date: 08/18/2023 CLINICAL DATA:  Bilateral lower extremity edema, right greater than left. EXAM: BILATERAL LOWER EXTREMITY VENOUS DOPPLER ULTRASOUND TECHNIQUE: Gray-scale sonography with graded compression, as well as color Doppler and duplex ultrasound were performed to evaluate the lower extremity deep venous systems from the level of the common femoral vein and including the common  femoral, femoral, profunda femoral, popliteal and calf veins including the posterior tibial, peroneal and gastrocnemius veins when visible. The superficial great saphenous vein was also interrogated. Spectral Doppler was utilized to evaluate flow at rest and with distal augmentation maneuvers in the common femoral, femoral and popliteal veins. COMPARISON:  None Available. FINDINGS: RIGHT LOWER EXTREMITY Common Femoral Vein: No evidence of thrombus. Normal compressibility, respiratory phasicity and response to augmentation. Saphenofemoral Junction: No evidence of thrombus. Normal compressibility and flow on color Doppler imaging. Profunda Femoral Vein: No evidence of thrombus. Normal compressibility and flow on color Doppler imaging. Femoral Vein: No evidence of thrombus. Normal compressibility, respiratory phasicity and response to augmentation. Popliteal Vein: No evidence of thrombus. Normal compressibility, respiratory phasicity and response to augmentation. Calf Veins: No evidence of thrombus. Normal compressibility and flow on color Doppler imaging. Superficial Great Saphenous Vein: No evidence of thrombus. Normal compressibility. Venous Reflux:  None. Other Findings: No evidence of superficial thrombophlebitis or abnormal fluid collection. LEFT LOWER EXTREMITY  Common Femoral Vein: No evidence of thrombus. Normal compressibility, respiratory phasicity and response to augmentation. Saphenofemoral Junction: No evidence of thrombus. Normal compressibility and flow on color Doppler imaging. Profunda Femoral Vein: No evidence of thrombus. Normal compressibility and flow on color Doppler imaging. Femoral Vein: No evidence of thrombus. Normal compressibility, respiratory phasicity and response to augmentation. Popliteal Vein: No evidence of thrombus. Normal compressibility, respiratory phasicity and response to augmentation. Calf Veins: No evidence of thrombus. Normal compressibility and flow on color Doppler imaging.  Superficial Great Saphenous Vein: No evidence of thrombus. Normal compressibility. Venous Reflux:  None. Other Findings: No evidence of superficial thrombophlebitis or abnormal fluid collection. IMPRESSION: No evidence of deep venous thrombosis in either lower extremity. Electronically Signed   By: Irish Lack M.D.   On: 08/18/2023 08:36    CODE STATUS:  Code Status History     Date Active Date Inactive Code Status Order ID Comments User Context   11/13/2015 0949 11/15/2015 1650 Full Code [id number]  Barnetta Chapel, MD Inpatient       Orders Placed This Encounter  Procedures   CBC with Differential (Cancer Center Only)    Standing Status:   Future    Expiration Date:   09/11/2024   CMP (Cancer Center only)    Standing Status:   Future    Expiration Date:   09/11/2024   Magnesium    Standing Status:   Future    Expiration Date:   09/11/2024     Future Appointments  Date Time Provider Department Center  09/19/2023 10:45 AM [facility] PHLEBOTOMIST [facility] None  09/19/2023 11:00 AM [facility] INFUSION [facility] None  09/19/2023 11:20 AM Haylen Shelnutt, MD [facility] None  09/21/2023 11:00 AM Rennie Plowman, Cari M [facility] None  09/21/2023 12:00 PM [facility] LAB [facility] None  09/21/2023 12:15 PM [facility] INFUSION [facility] None  09/23/2023 11:45 AM [facility] FLUSH [facility] None      This document was completed utilizing speech recognition software. Grammatical errors, random word insertions, pronoun errors, and incomplete sentences are an occasional consequence of this system due to software limitations, ambient noise, and hardware issues. Any formal questions or concerns about the content, text or information contained within the body of this dictation should be directly addressed to the provider for clarification.

## 2023-09-12 NOTE — Progress Notes (Signed)
 Patients port flushed without difficulty.  Good blood return noted with no bruising or swelling noted at site.  Band aid applied.  Patient has office visit with Dr Arlana Pouch today, patient ambulated back to waiting area.

## 2023-09-12 NOTE — Assessment & Plan Note (Signed)
 Presents with symptoms suggestive of an upper respiratory tract infection, including yellow mucus, altered taste, and congestion. No fever or hemoptysis. Likely bacterial, but a viral etiology cannot be ruled out. Currently undergoing chemotherapy, which necessitates caution as it can exacerbate infections. - Prescribe azithromycin (Z-Pak) with dosing instructions: two tablets on day one, followed by one tablet daily for four more days. - Advise use of steam inhalation to alleviate congestion. - Prescribe codeine syrup for cough management, with instructions to contact the clinic if there are issues with the prescription. - Instruct to use mouthwash with numbing medicine to help with oral discomfort. - Advise to visit urgent care for a swab if symptoms do not improve by Friday.

## 2023-09-13 ENCOUNTER — Encounter: Payer: Self-pay | Admitting: Oncology

## 2023-09-14 ENCOUNTER — Other Ambulatory Visit: Payer: Self-pay | Admitting: Oncology

## 2023-09-14 ENCOUNTER — Other Ambulatory Visit: Payer: Self-pay

## 2023-09-14 ENCOUNTER — Ambulatory Visit

## 2023-09-14 ENCOUNTER — Other Ambulatory Visit (HOSPITAL_BASED_OUTPATIENT_CLINIC_OR_DEPARTMENT_OTHER): Payer: Self-pay

## 2023-09-14 ENCOUNTER — Encounter: Payer: Self-pay | Admitting: Oncology

## 2023-09-14 MED ORDER — GUAIFENESIN-CODEINE 100-10 MG/5ML PO SOLN
5.0000 mL | ORAL | 0 refills | Status: AC | PRN
  Filled 2023-09-14: qty 500, 17d supply, fill #0

## 2023-09-14 MED ORDER — LIDOCAINE VISCOUS HCL 2 % MT SOLN
5.0000 mL | Freq: Three times a day (TID) | OROMUCOSAL | 2 refills | Status: AC | PRN
  Filled 2023-09-14: qty 150, 10d supply, fill #0

## 2023-09-14 MED ORDER — GUAIFENESIN-CODEINE 200-10 MG/5ML PO LIQD
5.0000 mL | Freq: Three times a day (TID) | ORAL | 0 refills | Status: AC | PRN
Start: 2023-09-14 — End: ?
  Filled 2023-09-14: qty 500, 34d supply, fill #0

## 2023-09-15 ENCOUNTER — Other Ambulatory Visit (HOSPITAL_BASED_OUTPATIENT_CLINIC_OR_DEPARTMENT_OTHER): Payer: Self-pay

## 2023-09-16 ENCOUNTER — Other Ambulatory Visit (HOSPITAL_BASED_OUTPATIENT_CLINIC_OR_DEPARTMENT_OTHER): Payer: Self-pay

## 2023-09-16 ENCOUNTER — Ambulatory Visit

## 2023-09-18 ENCOUNTER — Other Ambulatory Visit (HOSPITAL_BASED_OUTPATIENT_CLINIC_OR_DEPARTMENT_OTHER): Payer: Self-pay

## 2023-09-19 ENCOUNTER — Inpatient Hospital Stay (HOSPITAL_BASED_OUTPATIENT_CLINIC_OR_DEPARTMENT_OTHER): Admitting: Oncology

## 2023-09-19 ENCOUNTER — Inpatient Hospital Stay

## 2023-09-19 ENCOUNTER — Encounter: Payer: Self-pay | Admitting: Oncology

## 2023-09-19 ENCOUNTER — Other Ambulatory Visit (HOSPITAL_BASED_OUTPATIENT_CLINIC_OR_DEPARTMENT_OTHER): Payer: Self-pay

## 2023-09-19 VITALS — BP 170/82 | HR 80 | Temp 98.1°F | Resp 18 | Ht 66.0 in | Wt 296.9 lb

## 2023-09-19 DIAGNOSIS — Z5111 Encounter for antineoplastic chemotherapy: Secondary | ICD-10-CM | POA: Diagnosis not present

## 2023-09-19 DIAGNOSIS — Z95828 Presence of other vascular implants and grafts: Secondary | ICD-10-CM

## 2023-09-19 DIAGNOSIS — C50911 Malignant neoplasm of unspecified site of right female breast: Secondary | ICD-10-CM | POA: Diagnosis not present

## 2023-09-19 DIAGNOSIS — J069 Acute upper respiratory infection, unspecified: Secondary | ICD-10-CM | POA: Diagnosis not present

## 2023-09-19 DIAGNOSIS — I1A0 Resistant hypertension: Secondary | ICD-10-CM

## 2023-09-19 LAB — CBC WITH DIFFERENTIAL (CANCER CENTER ONLY)
Abs Immature Granulocytes: 0.08 10*3/uL — ABNORMAL HIGH (ref 0.00–0.07)
Basophils Absolute: 0.1 10*3/uL (ref 0.0–0.1)
Basophils Relative: 1 %
Eosinophils Absolute: 0 10*3/uL (ref 0.0–0.5)
Eosinophils Relative: 0 %
HCT: 32.7 % — ABNORMAL LOW (ref 36.0–46.0)
Hemoglobin: 10.6 g/dL — ABNORMAL LOW (ref 12.0–15.0)
Immature Granulocytes: 1 %
Lymphocytes Relative: 15 %
Lymphs Abs: 1 10*3/uL (ref 0.7–4.0)
MCH: 27.3 pg (ref 26.0–34.0)
MCHC: 32.4 g/dL (ref 30.0–36.0)
MCV: 84.3 fL (ref 80.0–100.0)
Monocytes Absolute: 0.9 10*3/uL (ref 0.1–1.0)
Monocytes Relative: 13 %
Neutro Abs: 4.9 10*3/uL (ref 1.7–7.7)
Neutrophils Relative %: 70 %
Platelet Count: 329 10*3/uL (ref 150–400)
RBC: 3.88 MIL/uL (ref 3.87–5.11)
RDW: 16.4 % — ABNORMAL HIGH (ref 11.5–15.5)
WBC Count: 6.9 10*3/uL (ref 4.0–10.5)
nRBC: 0.4 % — ABNORMAL HIGH (ref 0.0–0.2)

## 2023-09-19 LAB — CMP (CANCER CENTER ONLY)
ALT: 20 U/L (ref 0–44)
AST: 16 U/L (ref 15–41)
Albumin: 3.9 g/dL (ref 3.5–5.0)
Alkaline Phosphatase: 86 U/L (ref 38–126)
Anion gap: 7 (ref 5–15)
BUN: 10 mg/dL (ref 6–20)
CO2: 26 mmol/L (ref 22–32)
Calcium: 8.8 mg/dL — ABNORMAL LOW (ref 8.9–10.3)
Chloride: 104 mmol/L (ref 98–111)
Creatinine: 0.87 mg/dL (ref 0.44–1.00)
GFR, Estimated: >60 mL/min (ref >60)
Glucose, Bld: 110 mg/dL — ABNORMAL HIGH (ref 70–99)
Potassium: 3.6 mmol/L (ref 3.5–5.1)
Sodium: 137 mmol/L (ref 135–145)
Total Bilirubin: 0.4 mg/dL (ref 0.0–1.2)
Total Protein: 7.1 g/dL (ref 6.5–8.1)

## 2023-09-19 LAB — MAGNESIUM: Magnesium: 1.9 mg/dL (ref 1.7–2.4)

## 2023-09-19 MED ORDER — HEPARIN SOD (PORK) LOCK FLUSH 100 UNIT/ML IV SOLN
500.0000 [IU] | Freq: Once | INTRAVENOUS | Status: AC
  Administered 2023-09-19: 500 [IU] via INTRAVENOUS

## 2023-09-19 MED ORDER — SODIUM CHLORIDE 0.9% FLUSH
10.0000 mL | Freq: Once | INTRAVENOUS | Status: AC
  Administered 2023-09-19: 10 mL via INTRAVENOUS

## 2023-09-19 NOTE — Progress Notes (Signed)
 [facility]  ONCOLOGY CLINIC PROGRESS NOTE   Patient Care Team: Carolin Coy as PCP - General (Physician Assistant)  PATIENT NAME: Jasmine Novak   MR#: [id number] DOB: 11-01-1970  Date of visit: 09/19/2023   ASSESSMENT & PLAN:   Jasmine Novak is a 53 y.o. lady with a past medical history of hypertension, was referred to our clinic in December 2024 for newly diagnosed inflammatory carcinoma of right breast.  ER weakly positive at 10%, PR negative, HER2/neu negative.  Clinically behaving like triple negative breast cancer.  Had severe infusion reaction to first dose of Taxol and later moderate reaction to Taxotere.  Treatment switched to to dose dense AC, to be followed by weekly Abraxane.  Inflammatory carcinoma of breast, right (HCC) Recently diagnosed with right breast mass (6 cm) and biopsy confirmed malignancy. Estrogen receptor weakly positive at 10%, progesterone receptor negative, and HER2 negative. Left breast has a small mass (6-8 mm) not concerning for malignancy. No lymph node involvement on biopsy but ultrasound was concerning for 3-4 right axillary lymph nodes. Inflammatory breast cancer.  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  -Previously I discussed staging, prognosis, plan of care, treatment options.  Reviewed NCCN guidelines.  On 06/13/2023, staging PET/CT showed intense metabolic activity within the right breast mass consistent with primary breast carcinoma.  Hypermetabolic right axillary and subpectoralis lymph nodes consistent with local nodal metastasis.  No evidence of distant metastatic disease.    Plan made to proceed with cycle 1, day 1 of carboplatin, paclitaxel and Keytruda on 07/04/2023. She received Keytruda first.  Later she started Taxol and few minutes into infusion, she had severe infusion reaction with chest pain, hypotension, diaphoresis, hypoxia.  EKG was unremarkable.  Blood pressure and O2 sats normalized after  additional allergy medication and supplemental oxygen. We will not rechallenge her with Taxol. Plan made to switch treatments to Taxotere and cyclophosphamide regimen, to be given every 3 weeks for up to 6 cycles.  On 07/13/2023, she had infusion reaction to Taxotere as well, although not as severe as she had with Taxol.  She mainly had persistent lower back pain, increased blood pressure and some chest discomfort, which resolved quickly with pausing the infusion and Tylenol.  Plan made to switch treatments to dose dense AC, followed by weekly Abraxane. Started AC from 07/27/2023.  Received 3 cycles so far.  She already had significant improvement, including resolution of redness and pain. The current regimen is achieving the desired response.  Plan to continue for 1 more cycle of dose dense AC.    With cycle 2, we had to dose reduce chemotherapy by approximately 20% because of previous cytopenias despite growth factor support.  Blood counts are currently within normal limits.  We proceeded with full strength of chemotherapy with cycle 3 on 08/31/2023.    Due for cycle 4 of chemotherapy this week.  We deferred chemotherapy last week because of upper respiratory infection symptoms.  Labs today reveal no dose-limiting toxicities.  Will proceed with cycle 4 of AC on 09/21/2023 as scheduled.  This will complete the last dose of AC.  Following completion of 4 cycles of dose dense AC, treatment will be switched to Abraxane weekly for up to 12 cycles.  Due to previous adverse reactions to chemotherapy, the upcoming switch to an nab-paclitaxel will be administered with caution.  Generally speaking, there is no cross-reactivity for nab-paclitaxel, anticipated to be well-tolerated due to its protein-bound nature.  Per patient preference, we will continue chemotherapy on Thursdays going forward, to accommodate her work schedule.  Resistant hypertension Patient has a history of hypertension and was previously  misdiagnosed with congestive heart failure. Currently on Amlodipine once daily, Carvedilol twice daily, irbesartan daily, spironolactone daily, and Furosemide once daily. -Continue current medications. -Emphasize the importance of good blood pressure control, especially in the context of ongoing chemotherapy.  Upper respiratory infection Intermittent cough with clear and thick sputum, occasionally light yellow in the morning. Voice affected by coughing. Symptoms suggestive of resolving bronchitis. Lungs are clear, indicating upper respiratory involvement. No signs of active infection. Completed a course of azithromycin. - Advise use of humidifier at night to alleviate symptoms - Start prescribed cough medicine available at the clinic's pharmacy - Continue using cough drops for symptomatic relief   I reviewed lab results and outside records for this visit and discussed relevant results with the patient. Diagnosis, plan of care and treatment options were also discussed in detail with the patient. Opportunity provided to ask questions and answers provided to her apparent satisfaction. Provided instructions to call our clinic with any problems, questions or concerns prior to return visit. I recommended to continue follow-up with PCP and sub-specialists. She verbalized understanding and agreed with the plan.   NCCN guidelines have been consulted in the planning of this patient's care.  I spent a total of 30 minutes during this encounter with the patient including review of chart and various tests results, discussions about plan of care and coordination of care plan.  Arlo Berber, MD  09/19/2023 2:32 PM  [facility] - A DEPT OF [facility] [address] 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  Current Treatment: Plan made initially for neoadjuvant chemotherapy using KEYNOTE-522 protocol (carboplatin and paclitaxel plus Keytruda for 4 cycles followed by Adriamycin, Cytoxan and Keytruda for 4 cycles followed by 9 more doses of adjuvant Keytruda).  Started cycle 1 of chemotherapy on 07/04/2023.  She received Keytruda first.  Later she started Taxol and few minutes into infusion, she had severe infusion reaction with chest pain, hypotension, diaphoresis, hypoxia.  EKG was unremarkable.  Blood pressure and O2 sats normalized after additional allergy medication and supplemental oxygen.  We will not rechallenge her with Taxol.  Plan made to switch treatments to Taxotere and cyclophosphamide regimen, to be given every 3 weeks for up to 6 cycles.  He had moderate reaction to Taxotere as well.  Hence plan made to switch treatments to AC regimen in a dose dense fashion for up to 4 cycles, starting from 07/27/2023.  This will be followed by Abraxane weekly.  INTERVAL HISTORY:    Discussed the use of AI scribe software for clinical note transcription with the patient, who gave verbal consent to proceed.   Jasmine Novak is here today for repeat clinical assessment.   Discussed the use of AI scribe software for clinical note transcription with the patient, who gave verbal consent to proceed.  History of Present Illness  She experiences intermittent voice changes, describing her voice as 'coming and going.' Her voice is normal in the morning but deteriorates after talking at work, accompanied by severe coughing. She uses cough drops to manage these symptoms.  She continues  to experience a persistent cough, particularly in the morning, with expectoration of clear and thick sputum that is light yellow in the morning and clears up throughout the day. The cough is less severe than before, and she uses a humidifier at  night, which provides some relief.  She completed a course of antibiotics, specifically a Z-Pak. Despite this treatment, her symptoms persist. She has been prescribed cough medicine, which is now available at the pharmacy downstairs after an issue with the initial prescription at [facility].   She notes skin changes, specifically hyperpigmentation, which is a new occurrence since her last chemotherapy session. No pain in her fingers. She is using a moisturizer on her hands and feet, and reports improvement in her condition. She has not experienced these symptoms with previous chemotherapy treatments.  She is currently working in a daycare and has not mentioned any sick contacts among her children. Due to the current upper respiratory infection, her chemotherapy treatment is delayed by one week to prevent exacerbation of the infection.  I have reviewed the past medical history, past surgical history, social history and family history with the patient and they are unchanged from previous note.  HISTORY OF PRESENT ILLNESS:   ONCOLOGY HISTORY:  The patient has not been getting regular mammograms previously and has not seen a primary doctor recently.    She reports lump in her right breast that she noticed in mid-October 2024. The lump was causing discomfort, particularly when she is moving or wearing a bra. The patient reports that the area around the lump is tender and warm to the touch. She has been taking ibuprofen to manage the discomfort. She is right-handed and has been trying to avoid using her right arm due to the discomfort from the lump.    Since pain and swelling persisted, she presented to the ED on 05/07/2023.  A CT chest with contrast was obtained on 05/07/2023 which showed 5 x 4.2 cm mass within the right breast with diffuse cutaneous thickening overlying the right breast, highly suspicious for primary breast malignancy.  Right axillary lymphadenopathy measuring up to 13 mm, suspicious for  metastatic disease.  A 3 mm subpleural right upper lobe lung nodule, indeterminate. Coronary artery calcifications and left atrial enlargement was also noted.   ED provider reached out to our clinic for further assistance and management.  We arranged for diagnostic mammogram, ultrasound and biopsy.   On 05/22/2023, diagnostic mammogram showed 5.3 cm highly suspicious mass in the right breast associated with adjacent inflammation and overlying skin thickening consistent with inflammatory breast carcinoma.  3-4 abnormal right axillary lymph nodes, suspicious for metastatic disease.  2 adjacent benign clusters of cysts in the left breast.  No evidence of left breast malignancy.  Ultrasound showed similar findings.   On 05/23/2023 she underwent core needle biopsy of right breast mass.  Pathology showed invasive ductal carcinoma, mitotic score 3, grade 3.  ER weakly positive at 10%.  PR negative, HER2/neu was 2+ by IHC, negative by FISH.  High Ki-67 index of 80%.   With these findings, she presented to clinic to establish care with [place] on 06/06/2023.  Request submitted for PET/CT and echocardiogram.   On 06/13/2023, staging PET/CT showed intense metabolic activity within the right breast mass consistent with primary breast carcinoma.  Hypermetabolic right axillary and subpectoralis lymph nodes consistent with local nodal metastasis.  No evidence of distant metastatic disease.    Given no evidence of metastatic disease, plan is to treat her with neoadjuvant  chemoimmunotherapy using KEYNOTE-522 protocol (carboplatin and paclitaxel plus Keytruda for 4 cycles followed by Adriamycin, Cytoxan and Keytruda for 4 cycles followed by 9 more doses of adjuvant Keytruda).  Started systemic treatments from 07/04/2023.  Started cycle 1 of chemotherapy on 07/04/2023.  She received Keytruda first.  Later she started Taxol and few minutes into infusion, she had severe infusion reaction with chest pain, hypotension, diaphoresis,  hypoxia.  EKG was unremarkable.  Blood pressure and O2 sats normalized after additional allergy medication and supplemental oxygen.   We will not rechallenge her with Taxol.   Plan made to switch treatments to Taxotere and cyclophosphamide regimen, to be given every 3 weeks for up to 6 cycles.  On 07/13/2023, she had infusion reaction to Taxotere as well, although not as severe as she had with Taxol.  She mainly had persistent lower back pain, increased blood pressure and some chest discomfort, which resolved quickly with pausing the infusion and Tylenol.   Plan made to switch treatments to dose dense AC, followed by weekly Abraxane.  Started this from 07/27/2023.   Oncology History  Inflammatory carcinoma of breast, right (HCC)  06/08/2023 Initial Diagnosis   Inflammatory carcinoma of breast, right (HCC)   06/15/2023 Cancer Staging   Staging form: Breast, AJCC 8th Edition - Clinical: Stage IIIC (cT4b, cN2, cM0, G3, ER+, PR-, HER2-) - Signed by Arlo Berber, MD on 06/15/2023 Histologic grading system: 3 grade system   07/04/2023 - 07/04/2023 Chemotherapy   Patient is on Treatment Plan : BREAST Pembrolizumab (200) D1 + Carboplatin (5) D1 + Paclitaxel (80) D1,8,15 q21d X 4 cycles / Pembrolizumab (200) D1 + AC D1 q21d x 4 cycles     07/13/2023 - 07/14/2023 Chemotherapy   Patient is on Treatment Plan : BREAST TC q21d     07/27/2023 -  Chemotherapy   Patient is on Treatment Plan : BREAST DOSE DENSE AC q14d / Nab-Paclitaxel (Abraxane) q7d         REVIEW OF SYSTEMS:   Review of Systems  Constitutional:  Positive for unexpected weight change.  HENT:   Positive for sore throat and trouble swallowing.        Black spots on tongue  Respiratory:  Positive for cough (yellow sputum).   Cardiovascular:  Positive for leg swelling.    All other pertinent systems were reviewed with the patient and are negative.  ALLERGIES: She is allergic to paclitaxel, docetaxel, lisinopril, and  fosaprepitant.  MEDICATIONS:  Current Outpatient Medications  Medication Sig Dispense Refill   amLODipine (NORVASC) 10 MG tablet Take 1 tablet (10 mg total) by mouth daily. 90 tablet 0   azithromycin (ZITHROMAX) 250 MG tablet Take 2 tablets by mouth once on day 1, then 1 tablet daily until complete 6 each 0   carvedilol (COREG) 12.5 MG tablet Take 12.5 mg by mouth.     furosemide (LASIX) 40 MG tablet TAKE 1 TABLET(40 MG) BY MOUTH DAILY 30 tablet 2   irbesartan (AVAPRO) 300 MG tablet Take by mouth.     ondansetron (ZOFRAN-ODT) 8 MG disintegrating tablet Take 1 tablet (8 mg total) by mouth every 8 (eight) hours as needed for nausea or vomiting. 30 tablet 1   spironolactone (ALDACTONE) 25 MG tablet Take by mouth.     guaiFENesin-codeine 100-10 MG/5ML syrup Take 5 mLs by mouth every 4 (four) hours as needed for cough. (Patient not taking: Reported on 09/19/2023) 500 mL 0   guaiFENesin-Codeine 200-10 MG/5ML LIQD Take 5 mLs by mouth 3 (three) times  daily as needed (Cough). (Patient not taking: Reported on 09/19/2023) 500 mL 0   ibuprofen (ADVIL) 200 MG tablet Take 200 mg by mouth every 6 (six) hours as needed. (Patient not taking: Reported on 09/19/2023)     lidocaine-diphenhydrAMINE-alum & mag hydroxide-simeth Take 5 mLs by mouth 3 (three) times daily as needed for mouth pain. (Patient not taking: Reported on 09/19/2023) 150 mL 2   loperamide (IMODIUM) 2 MG capsule Take 1-2 capsules (2-4 mg total) by mouth as needed for diarrhea or loose stools. (Patient not taking: Reported on 09/19/2023) 30 capsule 1   loratadine (CLARITIN REDITABS) 10 MG dissolvable tablet Take 1 tablet (10 mg total) by mouth daily. (Patient not taking: Reported on 09/19/2023) 30 tablet 1   magic mouthwash (nystatin, hydrocortisone, diphenhydrAMINE, lidocaine) suspension Swish and swallow 5 mLs 3 (three) times daily as needed for mouth pain. (Patient not taking: Reported on 08/10/2023) 500 mL 1   magic mouthwash (lidocaine, diphenhydrAMINE,  alum & mag hydroxide) suspension Swish and swallow 5 mLs by mouth 3 (three) times daily as needed for mouth pain. (Patient not taking: Reported on 08/29/2023) 150 mL 1   traMADol (ULTRAM) 50 MG tablet Take 1 tablet (50 mg total) by mouth every 6 (six) hours as needed. (Patient not taking: Reported on 09/19/2023) 90 tablet 0   No current facility-administered medications for this visit.     VITALS:   Blood pressure (!) 170/82, pulse 80, temperature 98.1 F (36.7 C), temperature source Temporal, resp. rate 18, height 5\' 6"  (1.676 m), weight 296 lb 14.4 oz (134.7 kg), last menstrual period 01/17/2022, SpO2 100%.  Wt Readings from Last 3 Encounters:  09/19/23 296 lb 14.4 oz (134.7 kg)  09/12/23 294 lb (133.4 kg)  08/29/23 (!) 308 lb 9.6 oz (140 kg)    Body mass index is 47.92 kg/m.   Onc Performance Status - 09/19/23 1133       ECOG Perf Status   ECOG Perf Status Restricted in physically strenuous activity but ambulatory and able to carry out work of a light or sedentary nature, e.g., light house work, office work      KPS SCALE   KPS % SCORE Able to carry on normal activity, minor s/s of disease                 PHYSICAL EXAM:   Physical Exam Constitutional:      General: She is not in acute distress.    Appearance: Normal appearance.  HENT:     Head: Normocephalic and atraumatic.     Mouth/Throat:     Comments: Scattered dark pigmentation on the tongue.  No definite signs of infection.  No thrush. Eyes:     General: No scleral icterus.    Conjunctiva/sclera: Conjunctivae normal.  Cardiovascular:     Rate and Rhythm: Normal rate and regular rhythm.     Heart sounds: Normal heart sounds.  Pulmonary:     Effort: Pulmonary effort is normal.     Breath sounds: Normal breath sounds.  Chest:     Comments: Breast exam deferred today.  Last exam from 08/29/2023 is as mentioned:  Previously noted erythema of the skin on the right breast has resolved.  No definite palpable  lump at this time.  Scattered nodularity in the right breast, much improved compared to prior.  Overall excellent clinical response. Abdominal:     General: There is no distension.  Musculoskeletal:     Right lower leg: Edema present.  Left lower leg: Edema present.  Neurological:     General: No focal deficit present.     Mental Status: She is alert and oriented to person, place, and time.  Psychiatric:        Mood and Affect: Mood normal.        Behavior: Behavior normal.        Thought Content: Thought content normal.      LABORATORY DATA:   I have reviewed the data as listed.  Results for orders placed or performed in visit on 09/19/23  Magnesium  Result Value Ref Range   Magnesium 1.9 1.7 - 2.4 mg/dL  CMP (Cancer Center only)  Result Value Ref Range   Sodium 137 135 - 145 mmol/L   Potassium 3.6 3.5 - 5.1 mmol/L   Chloride 104 98 - 111 mmol/L   CO2 26 22 - 32 mmol/L   Glucose, Bld 110 (H) 70 - 99 mg/dL   BUN 10 6 - 20 mg/dL   Creatinine 1.61 0.96 - 1.00 mg/dL   Calcium 8.8 (L) 8.9 - 10.3 mg/dL   Total Protein 7.1 6.5 - 8.1 g/dL   Albumin 3.9 3.5 - 5.0 g/dL   AST 16 15 - 41 U/L   ALT 20 0 - 44 U/L   Alkaline Phosphatase 86 38 - 126 U/L   Total Bilirubin 0.4 0.0 - 1.2 mg/dL   GFR, Estimated >04 >54 mL/min   Anion gap 7 5 - 15  CBC with Differential (Cancer Center Only)  Result Value Ref Range   WBC Count 6.9 4.0 - 10.5 K/uL   RBC 3.88 3.87 - 5.11 MIL/uL   Hemoglobin 10.6 (L) 12.0 - 15.0 g/dL   HCT 09.8 (L) 11.9 - 14.7 %   MCV 84.3 80.0 - 100.0 fL   MCH 27.3 26.0 - 34.0 pg   MCHC 32.4 30.0 - 36.0 g/dL   RDW 82.9 (H) 56.2 - 13.0 %   Platelet Count 329 150 - 400 K/uL   nRBC 0.4 (H) 0.0 - 0.2 %   Neutrophils Relative % 70 %   Neutro Abs 4.9 1.7 - 7.7 K/uL   Lymphocytes Relative 15 %   Lymphs Abs 1.0 0.7 - 4.0 K/uL   Monocytes Relative 13 %   Monocytes Absolute 0.9 0.1 - 1.0 K/uL   Eosinophils Relative 0 %   Eosinophils Absolute 0.0 0.0 - 0.5 K/uL    Basophils Relative 1 %   Basophils Absolute 0.1 0.0 - 0.1 K/uL   Immature Granulocytes 1 %   Abs Immature Granulocytes 0.08 (H) 0.00 - 0.07 K/uL       RADIOGRAPHIC STUDIES:  I have personally reviewed the radiological images as listed and agree with the findings in the report.  No results found.   CODE STATUS:  Code Status History     Date Active Date Inactive Code Status Order ID Comments User Context   11/13/2015 0949 11/15/2015 1650 Full Code [id number]  Barnetta Chapel, MD Inpatient       Orders Placed This Encounter  Procedures   CBC with Differential (Cancer Center Only)    Standing Status:   Future    Expected Date:   10/19/2023    Expiration Date:   10/18/2024   CMP (Cancer Center only)    Standing Status:   Future    Expected Date:   10/19/2023    Expiration Date:   10/18/2024     Future Appointments  Date Time Provider Department Center  09/21/2023 11:00 AM Ora Billing, Cari M [facility] None  09/21/2023 12:00 PM [facility] LAB [facility] None  09/21/2023 12:15 PM [facility] INFUSION [facility] None  09/23/2023 11:45 AM [facility] FLUSH [facility] None      This document was completed utilizing speech recognition software. Grammatical errors, random word insertions, pronoun errors, and incomplete sentences are an occasional consequence of this system due to software limitations, ambient noise, and hardware issues. Any formal questions or concerns about the content, text or information contained within the body of this dictation should be directly addressed to the provider for clarification.

## 2023-09-19 NOTE — Assessment & Plan Note (Addendum)
 Recently diagnosed with right breast mass (6 cm) and biopsy confirmed malignancy. Estrogen receptor weakly positive at 10%, progesterone receptor negative, and HER2 negative. Left breast has a small mass (6-8 mm) not concerning for malignancy. No lymph node involvement on biopsy but ultrasound was concerning for 3-4 right axillary lymph nodes. Inflammatory breast cancer.  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  -Previously I discussed staging, prognosis, plan of care, treatment options.  Reviewed NCCN guidelines.  On 06/13/2023, staging PET/CT showed intense metabolic activity within the right breast mass consistent with primary breast carcinoma.  Hypermetabolic right axillary and subpectoralis lymph nodes consistent with local nodal metastasis.  No evidence of distant metastatic disease.    Plan made to proceed with cycle 1, day 1 of carboplatin, paclitaxel and Keytruda on 07/04/2023. She received Keytruda first.  Later she started Taxol and few minutes into infusion, she had severe infusion reaction with chest pain, hypotension, diaphoresis, hypoxia.  EKG was unremarkable.  Blood pressure and O2 sats normalized after additional allergy medication and supplemental oxygen. We will not rechallenge her with Taxol. Plan made to switch treatments to Taxotere and cyclophosphamide regimen, to be given every 3 weeks for up to 6 cycles.  On 07/13/2023, she had infusion reaction to Taxotere as well, although not as severe as she had with Taxol.  She mainly had persistent lower back pain, increased blood pressure and some chest discomfort, which resolved quickly with pausing the infusion and Tylenol.  Plan made to switch treatments to dose dense AC, followed by weekly Abraxane. Started AC from 07/27/2023.  Received 3 cycles so far.  She already had significant improvement, including resolution of redness and pain. The current regimen is achieving the desired response.  Plan to continue for 1 more cycle  of dose dense AC.    With cycle 2, we had to dose reduce chemotherapy by approximately 20% because of previous cytopenias despite growth factor support.  Blood counts are currently within normal limits.  We proceeded with full strength of chemotherapy with cycle 3 on 08/31/2023.    Due for cycle 4 of chemotherapy this week.  We deferred chemotherapy last week because of upper respiratory infection symptoms.  Labs today reveal no dose-limiting toxicities.  Will proceed with cycle 4 of AC on 09/21/2023 as scheduled.  This will complete the last dose of AC.  Following completion of 4 cycles of dose dense AC, treatment will be switched to Abraxane weekly for up to 12 cycles.  Due to previous adverse reactions to chemotherapy, the upcoming switch to an nab-paclitaxel will be administered with caution.  Generally speaking, there is no cross-reactivity for nab-paclitaxel, anticipated to be well-tolerated due to its protein-bound nature.   Per patient preference, we will continue chemotherapy on Thursdays going forward, to accommodate her work schedule.

## 2023-09-19 NOTE — Assessment & Plan Note (Signed)
 Patient has a history of hypertension and was previously misdiagnosed with congestive heart failure. Currently on Amlodipine once daily, Carvedilol twice daily, irbesartan daily, spironolactone daily, and Furosemide once daily. -Continue current medications. -Emphasize the importance of good blood pressure control, especially in the context of ongoing chemotherapy.

## 2023-09-19 NOTE — Progress Notes (Signed)
 Patient seen by Dr. Gale Jude Pasam today  Vitals are within treatment parameters:Yes   Labs are within treatment parameters: Yes   Treatment plan has been signed: Yes   Per physician team, Patient is ready for treatment and there are NO modifications to the treatment plan.  "Patient is good to tx this Thursday." Per Dr. Randye Buttner.

## 2023-09-19 NOTE — Assessment & Plan Note (Signed)
 Intermittent cough with clear and thick sputum, occasionally light yellow in the morning. Voice affected by coughing. Symptoms suggestive of resolving bronchitis. Lungs are clear, indicating upper respiratory involvement. No signs of active infection. Completed a course of azithromycin. - Advise use of humidifier at night to alleviate symptoms - Start prescribed cough medicine available at the clinic's pharmacy - Continue using cough drops for symptomatic relief

## 2023-09-20 ENCOUNTER — Encounter: Payer: Self-pay | Admitting: Oncology

## 2023-09-20 ENCOUNTER — Other Ambulatory Visit (HOSPITAL_BASED_OUTPATIENT_CLINIC_OR_DEPARTMENT_OTHER): Payer: Self-pay

## 2023-09-20 NOTE — Progress Notes (Deleted)
 REFERRING PROVIDER: Meryl Crutch, MD [address] [place],  [place] [address]   PRIMARY PROVIDER:  Lavonda Jumbo, PA-C  PRIMARY REASON FOR VISIT:  No diagnosis found.   HISTORY OF PRESENT ILLNESS:   Jasmine Novak, a 53 y.o. female, was seen for a [place] cancer genetics consultation at the request of Dr. Arlana Pouch due to a personal history of breast cancer.  Jasmine Novak presents to clinic today to discuss the possibility of a hereditary predisposition to cancer, to discuss genetic testing, and to further clarify her future cancer risks, as well as potential cancer risks for family members.   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   CANCER HISTORY:  Oncology History  Inflammatory carcinoma of breast, right (HCC)  06/08/2023 Initial Diagnosis   Inflammatory carcinoma of breast, right (HCC)   06/15/2023 Cancer Staging   Staging form: Breast, AJCC 8th Edition - Clinical: Stage IIIC (cT4b, cN2, cM0, G3, ER+, PR-, HER2-) - Signed by Meryl Crutch, MD on 06/15/2023 Histologic grading system: 3 grade system   07/04/2023 - 07/04/2023 Chemotherapy   Patient is on Treatment Plan : BREAST Pembrolizumab (200) D1 + Carboplatin (5) D1 + Paclitaxel (80) D1,8,15 q21d X 4 cycles / Pembrolizumab (200) D1 + AC D1 q21d x 4 cycles     07/13/2023 - 07/14/2023 Chemotherapy   Patient is on Treatment Plan : BREAST TC q21d     07/27/2023 -  Chemotherapy   Patient is on Treatment Plan : BREAST DOSE DENSE AC q14d / Nab-Paclitaxel (Abraxane) q7d        SCREENING/RISK FACTORS:  Colonoscopy: {Yes/No-Ex:120004}; {normal/abnormal/not examined:14677}. Hysterectomy: {Yes/No-Ex:120004}.  Ovaries intact: {Yes/No-Ex:120004}.  Dermatology screening: {Yes/No-Ex:120004}  Blood transfusion within past 4 weeks: {Yes/No-Ex:120004}    Past Medical History:   Diagnosis Date   Herpes simplex    states hasn't had outbreak since she was "young"   Hypertension     Past Surgical History:  Procedure Laterality Date   BREAST BIOPSY Right 05/23/2023   [place] RT BREAST BX W LOC DEV 1ST LESION IMG BX SPEC US GUIDE 05/23/2023 [facility]   CARPAL TUNNEL RELEASE  1996 & 1999   bilateral wrist   IR CV LINE INJECTION  07/13/2023   IR IMAGING GUIDED PORT INSERTION  07/03/2023   IR IMAGING GUIDED PORT INSERTION  07/20/2023   IR REMOVAL TUN ACCESS W/ PORT W/O FL MOD SED  07/20/2023    FAMILY HISTORY:  We obtained a detailed, 4-generation family history.  Significant diagnoses are listed below: Family History  Problem Relation Age of Onset   Stroke Father    Heart disease Daughter    Diabetes Maternal Grandmother     Jasmine Novak. Illes is {aware/unaware} of previous family history of genetic testing for hereditary cancer risks. Patient's maternal ancestors are of *** descent, and paternal ancestors are of *** descent. There {IS NO:12509} reported Ashkenazi Jewish ancestry. There {IS NO:12509} known consanguinity.  GENETIC COUNSELING ASSESSMENT: Jasmine Novak is a 53 y.o. female with a {Personal/family:20331} history of {cancer/polyps} which is somewhat suggestive of a {DISEASE} and predisposition to cancer given ***. We, therefore, discussed and recommended the following at today's visit.   DISCUSSION: We discussed that *** - ***% of *** is hereditary.  Most cases of *** associated with ***.  There are other genes that can be associated with hereditary *** cancer syndromes.  These  include ***.  We discussed that testing is beneficial for several reasons including knowing how to follow individuals for their cancer risks, identifying whether potential treatment options *** would be beneficial, and understanding if other family members could be at an increased risk for cancer and allowing them to undergo genetic testing.   We reviewed the characteristics, features and  inheritance patterns of hereditary cancer syndromes. We also discussed genetic testing, including the appropriate family members to test, the process of testing, insurance coverage and turn-around-time for results. We discussed the implications of a negative, positive, carrier and/or variant of uncertain significant result. We recommended Jasmine Novak pursue genetic testing for a panel that includes genes associated with *** cancer.   Jasmine Novak  was offered a common hereditary cancer panel (~40 genes) and an expanded pan-cancer panel (~70 genes). Jasmine Novak was informed of the benefits and limitations of each panel, including that expanded pan-cancer panels contain genes that do not have clear management guidelines at this point in time.  We also discussed that as the number of genes included on a panel increases, the chances of variants of uncertain significance increases.  After considering the benefits and limitations of each gene panel, Jasmine Novak  elected to have *** through ***.   Based on Jasmine Novak's {Personal/family:20331} history of cancer, she meets medical criteria for genetic testing. Despite that she meets criteria, she may still have an out of pocket cost. We discussed that if her out of pocket cost for testing is over $100, the laboratory should contact her and discuss the self-pay prices and/or patient pay assistance programs.    ***We reviewed the characteristics, features and inheritance patterns of hereditary cancer syndromes. We also discussed genetic testing, including the appropriate family members to test, the process of testing, insurance coverage and turn-around-time for results. We discussed the implications of a negative, positive and/or variant of uncertain significant result. In order to get genetic test results in a timely manner so that Jasmine Novak can use these genetic test results for surgical decisions, we recommended Jasmine Novak pursue genetic testing for the ***. Once complete, we  recommend Jasmine Novak pursue reflex genetic testing to the *** gene panel.   Based on Jasmine Novak's {Personal/family:20331} history of cancer, she meets medical criteria for genetic testing. Despite that she meets criteria, she may still have an out of pocket cost.   ***We discussed with Jasmine Novak that the {Personal/family:20331} history does not meet insurance or NCCN criteria for genetic testing and, therefore, is not highly consistent with a familial hereditary cancer syndrome.  We feel she is at low risk to harbor a gene mutation associated with such a condition. Thus, we did not recommend any genetic testing, at this time, and recommended Jasmine Novak continue to follow the cancer screening guidelines given by her primary healthcare provider.  ***In order to estimate her chance of having a {CA GENE:62345} mutation, we used statistical models ({GENMODELS:62370}) that consider her personal medical history, family history and ancestry.  Because each model is different, there can be a lot of variability in the risks they give.  Therefore, these numbers must be considered a rough range and not a precise risk of having a {CA GENE:62345} mutation.  These models estimate that she has approximately a ***-***% chance of having a mutation. Based on this assessment of her family and personal history, genetic testing {IS/ISNOT:34056} recommended.  ***Based on the patient's {Personal/family:20331} history, a statistical model ({GENMODELS:62370}) was used to estimate her risk  of developing {CA HX:54794}. This estimates her lifetime risk of developing {CA HX:54794} to be approximately ***%. This estimation does not consider any genetic testing results.  The patient's lifetime breast cancer risk is a preliminary estimate based on available information using one of several models endorsed by the American Cancer Society (ACS). The ACS recommends consideration of breast MRI screening as an adjunct to mammography for patients at  high risk (defined as 20% or greater lifetime risk).   ***Jasmine Novak has been determined to be at high risk for breast cancer.  Therefore, we recommend that annual screening with mammography and breast MRI be performed.  ***begin at age 54, or 10 years prior to the age of breast cancer diagnosis in a relative (whichever is earlier).  We discussed that Jasmine Novak should discuss her individual situation with her referring physician and determine a breast cancer screening plan with which they are both comfortable.    PLAN: After considering the risks, benefits, and limitations, Jasmine Novak provided informed consent to pursue genetic testing and the blood sample was sent to {Lab} Laboratories for analysis of the {test}. Results should be available within approximately {TAT TIME} weeks, at which point they will be disclosed by telephone*** to Jasmine Novak. Kugler, as will any additional recommendations warranted by these results. Jasmine Novak. Beissel will receive a summary of her genetic counseling visit and a copy of her results once available. This information will also be available in Epic.   *** Despite our recommendation, Jasmine Novak. Bensen did not wish to pursue genetic testing at today's visit. We understand this decision and remain available to coordinate genetic testing at any time in the future. We, therefore, recommend Jasmine Novak. Homann continue to follow the cancer screening guidelines given by her primary healthcare provider.  ***Based on Jasmine Novak. Depaoli's family history, we recommended her ***, who was diagnosed with *** at age ***, have genetic counseling and testing. Jasmine Novak. Wohlford can let us  know if we can be of any assistance in coordinating genetic counseling and/or testing for appropriate relatives.   Lastly, we encouraged Jasmine Novak. Ceniceros to remain in contact with cancer genetics annually so that we can continuously update the family history and inform her of any changes in cancer genetics and testing that may be of benefit for this family.   Jasmine Novak.  Rager questions were answered to her satisfaction today. Our contact information was provided should additional questions or concerns arise. Thank you for the referral and allowing us  to share in the care of your patient.   Chelle Cayton M. Ora Billing, Jasmine Novak, [facility] Genetic Counselor [email or web address] (P) [phone]   *** minutes were spent on the date of the encounter in service to the patient including preparation, face-to-face consultation, documentation and care coordination.  ***The patient was accompanied by ***.  ***The patient was seen alone.  Drs. Iruku, Gudena and/or Maryalice Smaller were available to discuss this case as needed.    _______________________________________________________________________ For Office Staff:  Number of people involved in session: *** Was an Intern/ student involved with case: {YES/NO:63}

## 2023-09-21 ENCOUNTER — Inpatient Hospital Stay: Payer: No Typology Code available for payment source

## 2023-09-21 ENCOUNTER — Encounter: Payer: Self-pay | Admitting: Oncology

## 2023-09-21 ENCOUNTER — Inpatient Hospital Stay

## 2023-09-21 ENCOUNTER — Encounter: Payer: No Typology Code available for payment source | Admitting: Genetic Counselor

## 2023-09-21 VITALS — BP 172/74 | HR 87 | Temp 98.2°F | Resp 20 | Wt 299.6 lb

## 2023-09-21 DIAGNOSIS — C50911 Malignant neoplasm of unspecified site of right female breast: Secondary | ICD-10-CM

## 2023-09-21 DIAGNOSIS — Z5111 Encounter for antineoplastic chemotherapy: Secondary | ICD-10-CM | POA: Diagnosis not present

## 2023-09-21 MED ORDER — PALONOSETRON HCL INJECTION 0.25 MG/5ML
0.2500 mg | Freq: Once | INTRAVENOUS | Status: AC
  Administered 2023-09-21: 0.25 mg via INTRAVENOUS
  Filled 2023-09-21: qty 5

## 2023-09-21 MED ORDER — DIPHENHYDRAMINE HCL 50 MG/ML IJ SOLN
25.0000 mg | Freq: Once | INTRAMUSCULAR | Status: AC
  Administered 2023-09-21: 25 mg via INTRAVENOUS
  Filled 2023-09-21: qty 1

## 2023-09-21 MED ORDER — FAMOTIDINE IN NACL 20-0.9 MG/50ML-% IV SOLN
20.0000 mg | Freq: Once | INTRAVENOUS | Status: AC
  Administered 2023-09-21: 20 mg via INTRAVENOUS
  Filled 2023-09-21: qty 50

## 2023-09-21 MED ORDER — HEPARIN SOD (PORK) LOCK FLUSH 100 UNIT/ML IV SOLN
500.0000 [IU] | Freq: Once | INTRAVENOUS | Status: AC | PRN
Start: 2023-09-21 — End: 2023-09-21
  Administered 2023-09-21: 500 [IU]

## 2023-09-21 MED ORDER — SODIUM CHLORIDE 0.9 % IV SOLN: INTRAVENOUS | Status: DC

## 2023-09-21 MED ORDER — DEXAMETHASONE SODIUM PHOSPHATE 10 MG/ML IJ SOLN
10.0000 mg | Freq: Once | INTRAMUSCULAR | Status: AC
  Administered 2023-09-21: 10 mg via INTRAVENOUS
  Filled 2023-09-21: qty 1

## 2023-09-21 MED ORDER — SODIUM CHLORIDE 0.9% FLUSH
10.0000 mL | INTRAVENOUS | Status: DC | PRN
  Administered 2023-09-21: 10 mL

## 2023-09-21 MED ORDER — SODIUM CHLORIDE 0.9 % IV SOLN
600.0000 mg/m2 | Freq: Once | INTRAVENOUS | Status: AC
  Administered 2023-09-21: 1500 mg via INTRAVENOUS
  Filled 2023-09-21: qty 50

## 2023-09-21 MED ORDER — DOXORUBICIN HCL CHEMO IV INJECTION 2 MG/ML
60.0000 mg/m2 | Freq: Once | INTRAVENOUS | Status: AC
  Administered 2023-09-21: 156 mg via INTRAVENOUS
  Filled 2023-09-21: qty 78

## 2023-09-21 MED ORDER — PROCHLORPERAZINE EDISYLATE 10 MG/2ML IJ SOLN
10.0000 mg | Freq: Once | INTRAMUSCULAR | Status: AC
  Administered 2023-09-21: 10 mg via INTRAVENOUS
  Filled 2023-09-21: qty 2

## 2023-09-21 NOTE — Patient Instructions (Signed)
 [facility]   Discharge Instructions: Thank you for choosing [facility] to provide your oncology and hematology care.   If you have a lab appointment with the Cancer Center, please go directly to the Cancer Center and check in at the registration area.   Wear comfortable clothing and clothing appropriate for easy access to any Portacath or PICC line.   We strive to give you quality time with your provider. You may need to reschedule your appointment if you arrive late (15 or more minutes).  Arriving late affects you and other patients whose appointments are after yours.  Also, if you miss three or more appointments without notifying the office, you may be dismissed from the clinic at the provider's discretion.      For prescription refill requests, have your pharmacy contact our office and allow 72 hours for refills to be completed.    Today you received the following chemotherapy and/or immunotherapy agents ADRIAMYCIN/CYTOXAN      To help prevent nausea and vomiting after your treatment, we encourage you to take your nausea medication as directed.  BELOW ARE SYMPTOMS THAT SHOULD BE REPORTED IMMEDIATELY: *FEVER GREATER THAN 100.4 F (38 C) OR HIGHER *CHILLS OR SWEATING *NAUSEA AND VOMITING THAT IS NOT CONTROLLED WITH YOUR NAUSEA MEDICATION *UNUSUAL SHORTNESS OF BREATH *UNUSUAL BRUISING OR BLEEDING *URINARY PROBLEMS (pain or burning when urinating, or frequent urination) *BOWEL PROBLEMS (unusual diarrhea, constipation, pain near the anus) TENDERNESS IN MOUTH AND THROAT WITH OR WITHOUT PRESENCE OF ULCERS (sore throat, sores in mouth, or a toothache) UNUSUAL RASH, SWELLING OR PAIN  UNUSUAL VAGINAL DISCHARGE OR ITCHING   Items with * indicate a potential emergency and should be followed up as soon as possible or go to the Emergency Department if any problems should occur.  Please show the CHEMOTHERAPY ALERT CARD or  IMMUNOTHERAPY ALERT CARD at check-in to the Emergency Department and triage nurse.  Should you have questions after your visit or need to cancel or reschedule your appointment, please contact [facility]  Dept: [phone]  and follow the prompts.  Office hours are 8:00 a.m. to 4:30 p.m. Monday - Friday. Please note that voicemails left after 4:00 p.m. may not be returned until the following business day.  We are closed weekends and major holidays. You have access to a nurse at all times for urgent questions. Please call the main number to the clinic Dept: [phone] and follow the prompts.   For any non-urgent questions, you may also contact your provider using MyChart. We now offer e-Visits for anyone 37 and older to request care online for non-urgent symptoms. For details visit [email or web address].   Also download the MyChart app! Go to the app store, search "MyChart", open the app, select [place], and log in with your MyChart username and password.  Doxorubicin Injection What is this medication? DOXORUBICIN (dox oh ROO bi sin) treats some types of cancer. It works by slowing down the growth of cancer cells. This medicine may be used for other purposes; ask your health care provider or pharmacist if you have questions. COMMON BRAND NAME(S): Adriamycin, Adriamycin PFS, Adriamycin RDF, Rubex What should I tell my care team before I take this medication? They need to know if you have any of these conditions: Heart disease History of low blood cell levels caused by a medication Liver disease Recent or ongoing  radiation An unusual or allergic reaction to doxorubicin, other medications, foods, dyes, or preservatives If you or your partner are pregnant or trying to get pregnant Breast-feeding How should I use this medication? This medication is injected into a vein. It is given by your care team in a hospital or clinic  setting. Talk to your care team about the use of this medication in children. Special care may be needed. Overdosage: If you think you have taken too much of this medicine contact a poison control center or emergency room at once. NOTE: This medicine is only for you. Do not share this medicine with others. What if I miss a dose? Keep appointments for follow-up doses. It is important not to miss your dose. Call your care team if you are unable to keep an appointment. What may interact with this medication? 6-mercaptopurine Paclitaxel Phenytoin St. John's wort Trastuzumab Verapamil This list may not describe all possible interactions. Give your health care provider a list of all the medicines, herbs, non-prescription drugs, or dietary supplements you use. Also tell them if you smoke, drink alcohol, or use illegal drugs. Some items may interact with your medicine. What should I watch for while using this medication? Your condition will be monitored carefully while you are receiving this medication. You may need blood work while taking this medication. This medication may make you feel generally unwell. This is not uncommon as chemotherapy can affect healthy cells as well as cancer cells. Report any side effects. Continue your course of treatment even though you feel ill unless your care team tells you to stop. There is a maximum amount of this medication you should receive throughout your life. The amount depends on the medical condition being treated and your overall health. Your care team will watch how much of this medication you receive. Tell your care team if you have taken this medication before. Your urine may turn red for a few days after your dose. This is not blood. If your urine is dark or brown, call your care team. In some cases, you may be given additional medications to help with side effects. Follow all directions for their use. This medication may increase your risk of getting an  infection. Call your care team for advice if you get a fever, chills, sore throat, or other symptoms of a cold or flu. Do not treat yourself. Try to avoid being around people who are sick. This medication may increase your risk to bruise or bleed. Call your care team if you notice any unusual bleeding. Talk to your care team about your risk of cancer. You may be more at risk for certain types of cancers if you take this medication. Talk to your care team if you or your partner may be pregnant. Serious birth defects can occur if you take this medication during pregnancy and for 6 months after the last dose. Contraception is recommended while taking this medication and for 6 months after the last dose. Your care team can help you find the option that works for you. If your partner can get pregnant, use a condom while taking this medication and for 6 months after the last dose. Do not breastfeed while taking this medication. This medication may cause infertility. Talk to your care team if you are concerned about your fertility. What side effects may I notice from receiving this medication? Side effects that you should report to your care team as soon as possible: Allergic reactions--skin rash, itching, hives, swelling of  the face, lips, tongue, or throat Heart failure--shortness of breath, swelling of the ankles, feet, or hands, sudden weight gain, unusual weakness or fatigue Heart rhythm changes--fast or irregular heartbeat, dizziness, feeling faint or lightheaded, chest pain, trouble breathing Infection--fever, chills, cough, sore throat, wounds that don't heal, pain or trouble when passing urine, general feeling of discomfort or being unwell Low red blood cell level--unusual weakness or fatigue, dizziness, headache, trouble breathing Painful swelling, warmth, or redness of the skin, blisters or sores at the infusion site Unusual bruising or bleeding Side effects that usually do not require medical  attention (report to your care team if they continue or are bothersome): Diarrhea Hair loss Nausea Pain, redness, or swelling with sores inside the mouth or throat Red urine This list may not describe all possible side effects. Call your doctor for medical advice about side effects. You may report side effects to FDA at 1-800-FDA-1088. Where should I keep my medication? This medication is given in a hospital or clinic. It will not be stored at home. NOTE: This sheet is a summary. It may not cover all possible information. If you have questions about this medicine, talk to your doctor, pharmacist, or health care provider.  2024 Elsevier/Gold Standard (2022-08-25 00:00:00) Cyclophosphamide Injection What is this medication? CYCLOPHOSPHAMIDE (sye kloe FOSS fa mide) treats some types of cancer. It works by slowing down the growth of cancer cells. This medicine may be used for other purposes; ask your health care provider or pharmacist if you have questions. COMMON BRAND NAME(S): Cyclophosphamide, Cytoxan, Neosar What should I tell my care team before I take this medication? They need to know if you have any of these conditions: Heart disease Irregular heartbeat or rhythm Infection Kidney problems Liver disease Low blood cell levels (white cells, platelets, or red blood cells) Lung disease Previous radiation Trouble passing urine An unusual or allergic reaction to cyclophosphamide, other medications, foods, dyes, or preservatives Pregnant or trying to get pregnant Breast-feeding How should I use this medication? This medication is injected into a vein. It is given by your care team in a hospital or clinic setting. Talk to your care team about the use of this medication in children. Special care may be needed. Overdosage: If you think you have taken too much of this medicine contact a poison control center or emergency room at once. NOTE: This medicine is only for you. Do not share this  medicine with others. What if I miss a dose? Keep appointments for follow-up doses. It is important not to miss your dose. Call your care team if you are unable to keep an appointment. What may interact with this medication? Amphotericin B Amiodarone Azathioprine Certain antivirals for HIV or hepatitis Certain medications for blood pressure, such as enalapril, lisinopril, quinapril Cyclosporine Diuretics Etanercept Indomethacin Medications that relax muscles Metronidazole Natalizumab Tamoxifen Warfarin This list may not describe all possible interactions. Give your health care provider a list of all the medicines, herbs, non-prescription drugs, or dietary supplements you use. Also tell them if you smoke, drink alcohol, or use illegal drugs. Some items may interact with your medicine. What should I watch for while using this medication? This medication may make you feel generally unwell. This is not uncommon as chemotherapy can affect healthy cells as well as cancer cells. Report any side effects. Continue your course of treatment even though you feel ill unless your care team tells you to stop. You may need blood work while you are taking this medication. This  medication may increase your risk of getting an infection. Call your care team for advice if you get a fever, chills, sore throat, or other symptoms of a cold or flu. Do not treat yourself. Try to avoid being around people who are sick. Avoid taking medications that contain aspirin, acetaminophen, ibuprofen, naproxen, or ketoprofen unless instructed by your care team. These medications may hide a fever. Be careful brushing or flossing your teeth or using a toothpick because you may get an infection or bleed more easily. If you have any dental work done, tell your dentist you are receiving this medication. Drink water or other fluids as directed. Urinate often, even at night. Some products may contain alcohol. Ask your care team if  this medication contains alcohol. Be sure to tell all care teams you are taking this medicine. Certain medicines, like metronidazole and disulfiram, can cause an unpleasant reaction when taken with alcohol. The reaction includes flushing, headache, nausea, vomiting, sweating, and increased thirst. The reaction can last from 30 minutes to several hours. Talk to your care team if you wish to become pregnant or think you might be pregnant. This medication can cause serious birth defects if taken during pregnancy and for 1 year after the last dose. A negative pregnancy test is required before starting this medication. A reliable form of contraception is recommended while taking this medication and for 1 year after the last dose. Talk to your care team about reliable forms of contraception. Do not father a child while taking this medication and for 4 months after the last dose. Use a condom during this time period. Do not breast-feed while taking this medication or for 1 week after the last dose. This medication may cause infertility. Talk to your care team if you are concerned about your fertility. Talk to your care team about your risk of cancer. You may be more at risk for certain types of cancer if you take this medication. What side effects may I notice from receiving this medication? Side effects that you should report to your care team as soon as possible: Allergic reactions--skin rash, itching, hives, swelling of the face, lips, tongue, or throat Dry cough, shortness of breath or trouble breathing Heart failure--shortness of breath, swelling of the ankles, feet, or hands, sudden weight gain, unusual weakness or fatigue Heart muscle inflammation--unusual weakness or fatigue, shortness of breath, chest pain, fast or irregular heartbeat, dizziness, swelling of the ankles, feet, or hands Heart rhythm changes--fast or irregular heartbeat, dizziness, feeling faint or lightheaded, chest pain, trouble  breathing Infection--fever, chills, cough, sore throat, wounds that don't heal, pain or trouble when passing urine, general feeling of discomfort or being unwell Kidney injury--decrease in the amount of urine, swelling of the ankles, hands, or feet Liver injury--right upper belly pain, loss of appetite, nausea, light-colored stool, dark yellow or brown urine, yellowing skin or eyes, unusual weakness or fatigue Low red blood cell level--unusual weakness or fatigue, dizziness, headache, trouble breathing Low sodium level--muscle weakness, fatigue, dizziness, headache, confusion Red or dark brown urine Unusual bruising or bleeding Side effects that usually do not require medical attention (report to your care team if they continue or are bothersome): Hair loss Irregular menstrual cycles or spotting Loss of appetite Nausea Pain, redness, or swelling with sores inside the mouth or throat Vomiting This list may not describe all possible side effects. Call your doctor for medical advice about side effects. You may report side effects to FDA at 1-800-FDA-1088. Where should I keep  my medication? This medication is given in a hospital or clinic. It will not be stored at home. NOTE: This sheet is a summary. It may not cover all possible information. If you have questions about this medicine, talk to your doctor, pharmacist, or health care provider.  2024 Elsevier/Gold Standard (2021-10-08 00:00:00)

## 2023-09-22 ENCOUNTER — Other Ambulatory Visit (HOSPITAL_BASED_OUTPATIENT_CLINIC_OR_DEPARTMENT_OTHER): Payer: Self-pay

## 2023-09-23 ENCOUNTER — Inpatient Hospital Stay

## 2023-09-23 VITALS — BP 186/81 | HR 89 | Temp 97.6°F | Resp 17

## 2023-09-23 DIAGNOSIS — C50911 Malignant neoplasm of unspecified site of right female breast: Secondary | ICD-10-CM

## 2023-09-23 DIAGNOSIS — Z5111 Encounter for antineoplastic chemotherapy: Secondary | ICD-10-CM | POA: Diagnosis not present

## 2023-09-23 MED ORDER — PEGFILGRASTIM-CBQV 6 MG/0.6ML ~~LOC~~ SOSY
6.0000 mg | PREFILLED_SYRINGE | Freq: Once | SUBCUTANEOUS | Status: AC
Start: 2023-09-23 — End: 2023-09-23
  Administered 2023-09-23: 6 mg via SUBCUTANEOUS
  Filled 2023-09-23: qty 0.6

## 2023-09-26 ENCOUNTER — Ambulatory Visit: Admitting: Oncology

## 2023-09-26 ENCOUNTER — Other Ambulatory Visit

## 2023-09-27 ENCOUNTER — Other Ambulatory Visit: Payer: Self-pay

## 2023-09-28 ENCOUNTER — Ambulatory Visit

## 2023-09-29 ENCOUNTER — Other Ambulatory Visit: Payer: Self-pay

## 2023-09-29 MED ORDER — PROCHLORPERAZINE MALEATE 10 MG PO TABS: 10.0000 mg | ORAL_TABLET | Freq: Four times a day (QID) | ORAL | Status: DC | PRN

## 2023-10-01 ENCOUNTER — Other Ambulatory Visit: Payer: Self-pay

## 2023-10-03 ENCOUNTER — Inpatient Hospital Stay

## 2023-10-03 ENCOUNTER — Ambulatory Visit: Admitting: Oncology

## 2023-10-03 ENCOUNTER — Other Ambulatory Visit: Payer: Self-pay

## 2023-10-03 ENCOUNTER — Other Ambulatory Visit

## 2023-10-03 ENCOUNTER — Ambulatory Visit

## 2023-10-03 ENCOUNTER — Inpatient Hospital Stay (HOSPITAL_BASED_OUTPATIENT_CLINIC_OR_DEPARTMENT_OTHER): Admitting: Oncology

## 2023-10-03 ENCOUNTER — Other Ambulatory Visit (HOSPITAL_BASED_OUTPATIENT_CLINIC_OR_DEPARTMENT_OTHER): Payer: Self-pay

## 2023-10-03 VITALS — BP 169/90 | HR 87 | Temp 97.2°F | Resp 16 | Wt 293.5 lb

## 2023-10-03 DIAGNOSIS — C50911 Malignant neoplasm of unspecified site of right female breast: Secondary | ICD-10-CM

## 2023-10-03 DIAGNOSIS — I1A0 Resistant hypertension: Secondary | ICD-10-CM | POA: Diagnosis not present

## 2023-10-03 DIAGNOSIS — Z5111 Encounter for antineoplastic chemotherapy: Secondary | ICD-10-CM | POA: Diagnosis not present

## 2023-10-03 LAB — CBC WITH DIFFERENTIAL (CANCER CENTER ONLY)
Abs Immature Granulocytes: 1.37 10*3/uL — ABNORMAL HIGH (ref 0.00–0.07)
Basophils Absolute: 0.1 10*3/uL (ref 0.0–0.1)
Basophils Relative: 1 %
Eosinophils Absolute: 0.1 10*3/uL (ref 0.0–0.5)
Eosinophils Relative: 1 %
HCT: 30.4 % — ABNORMAL LOW (ref 36.0–46.0)
Hemoglobin: 9.7 g/dL — ABNORMAL LOW (ref 12.0–15.0)
Immature Granulocytes: 16 %
Lymphocytes Relative: 8 %
Lymphs Abs: 0.7 10*3/uL (ref 0.7–4.0)
MCH: 27.8 pg (ref 26.0–34.0)
MCHC: 31.9 g/dL (ref 30.0–36.0)
MCV: 87.1 fL (ref 80.0–100.0)
Monocytes Absolute: 0.9 10*3/uL (ref 0.1–1.0)
Monocytes Relative: 10 %
Neutro Abs: 5.5 10*3/uL (ref 1.7–7.7)
Neutrophils Relative %: 64 %
Platelet Count: 163 10*3/uL (ref 150–400)
RBC: 3.49 MIL/uL — ABNORMAL LOW (ref 3.87–5.11)
RDW: 17.5 % — ABNORMAL HIGH (ref 11.5–15.5)
Smear Review: ADEQUATE
WBC Count: 8.6 10*3/uL (ref 4.0–10.5)
nRBC: 0.2 % (ref 0.0–0.2)

## 2023-10-03 LAB — CMP (CANCER CENTER ONLY)
ALT: 17 U/L (ref 0–44)
AST: 20 U/L (ref 15–41)
Albumin: 4.1 g/dL (ref 3.5–5.0)
Alkaline Phosphatase: 111 U/L (ref 38–126)
Anion gap: 10 (ref 5–15)
BUN: 8 mg/dL (ref 6–20)
CO2: 25 mmol/L (ref 22–32)
Calcium: 9.1 mg/dL (ref 8.9–10.3)
Chloride: 106 mmol/L (ref 98–111)
Creatinine: 0.9 mg/dL (ref 0.44–1.00)
GFR, Estimated: >60 mL/min (ref >60)
Glucose, Bld: 111 mg/dL — ABNORMAL HIGH (ref 70–99)
Potassium: 3.6 mmol/L (ref 3.5–5.1)
Sodium: 141 mmol/L (ref 135–145)
Total Bilirubin: 0.3 mg/dL (ref 0.0–1.2)
Total Protein: 6.9 g/dL (ref 6.5–8.1)

## 2023-10-03 MED ORDER — PROCHLORPERAZINE MALEATE 10 MG PO TABS
10.0000 mg | ORAL_TABLET | Freq: Four times a day (QID) | ORAL | 0 refills | Status: AC | PRN
  Filled 2023-10-03: qty 30, 8d supply, fill #0

## 2023-10-03 MED ORDER — PROCHLORPERAZINE MALEATE 10 MG PO TABS: 10.0000 mg | ORAL_TABLET | Freq: Four times a day (QID) | ORAL | Status: DC | PRN

## 2023-10-03 NOTE — Progress Notes (Unsigned)
 [facility]  ONCOLOGY CLINIC PROGRESS NOTE   Patient Care Team: Charlane Cones as PCP - General (Physician Assistant)  PATIENT NAME: Jasmine Novak   MR#: [id number] DOB: 1970/06/12  Date of visit: 10/03/2023   ASSESSMENT & PLAN:   Meilin Licursi Jerrell is a 53 y.o. lady with a past medical history of hypertension, was referred to our clinic in December 2024 for newly diagnosed inflammatory carcinoma of right breast.  ER weakly positive at 10%, PR negative, HER2/neu negative.  Clinically behaving like triple negative breast cancer.  Had severe infusion reaction to first dose of Taxol  and later moderate reaction to Taxotere .  Treatment switched to to dose dense AC, to be followed by weekly Abraxane .  No problem-specific Assessment & Plan notes found for this encounter.    I reviewed lab results and outside records for this visit and discussed relevant results with the patient. Diagnosis, plan of care and treatment options were also discussed in detail with the patient. Opportunity provided to ask questions and answers provided to her apparent satisfaction. Provided instructions to call our clinic with any problems, questions or concerns prior to return visit. I recommended to continue follow-up with PCP and sub-specialists. She verbalized understanding and agreed with the plan.   NCCN guidelines have been consulted in the planning of this patient's care.  I spent a total of 30 minutes during this encounter with the patient including review of chart and various tests results, discussions about plan of care and coordination of care plan.  Arlo Berber, MD  10/03/2023 11:06 AM  [facility] - A DEPT OF [facility] [address] 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  Current Treatment: Plan made initially for neoadjuvant chemotherapy using KEYNOTE-522 protocol (carboplatin  and paclitaxel  plus Keytruda  for 4 cycles followed by Adriamycin , Cytoxan  and Keytruda  for 4 cycles followed by 9 more doses of adjuvant Keytruda ).  Started cycle 1 of chemotherapy on 07/04/2023.  She received Keytruda  first.  Later she started Taxol  and few minutes into infusion, she had severe infusion reaction with chest pain, hypotension, diaphoresis, hypoxia.  EKG was unremarkable.  Blood pressure and O2 sats normalized after additional allergy medication and supplemental oxygen.  We will not rechallenge her with Taxol .  Plan made to switch treatments to Taxotere  and cyclophosphamide  regimen, to be given every 3 weeks for up to 6 cycles.  He had moderate reaction to Taxotere  as well.  Hence plan made to switch treatments to AC regimen in a dose dense fashion for up to 4 cycles, starting from 07/27/2023.  This will be followed by Abraxane  weekly.  INTERVAL HISTORY:    Discussed the use of AI scribe software for clinical note transcription with the patient, who gave verbal consent to proceed.   Carlean L Sydnor is here today for repeat clinical assessment.   Discussed the use of AI scribe software for clinical note transcription with the patient, who gave verbal consent to proceed.  History of Present Illness  She experiences intermittent voice changes, describing her voice as 'coming and going.' Her voice is normal in the morning but deteriorates after talking at work, accompanied by severe coughing. She uses cough drops to manage these symptoms.  She  continues to experience a persistent cough, particularly in the morning, with expectoration of clear and thick sputum that is light yellow in the morning and clears up throughout the day. The cough is less severe than before, and she uses a humidifier at  night, which provides some relief.  She completed a course of antibiotics, specifically a Z-Pak. Despite this treatment, her symptoms persist. She has been prescribed cough medicine, which is now available at the pharmacy downstairs after an issue with the initial prescription at [facility].   She notes skin changes, specifically hyperpigmentation, which is a new occurrence since her last chemotherapy session. No pain in her fingers. She is using a moisturizer on her hands and feet, and reports improvement in her condition. She has not experienced these symptoms with previous chemotherapy treatments.  She is currently working in a daycare and has not mentioned any sick contacts among her children. Due to the current upper respiratory infection, her chemotherapy treatment is delayed by one week to prevent exacerbation of the infection.  I have reviewed the past medical history, past surgical history, social history and family history with the patient and they are unchanged from previous note.  HISTORY OF PRESENT ILLNESS:   ONCOLOGY HISTORY:  The patient has not been getting regular mammograms previously and has not seen a primary doctor recently.    She reports lump in her right breast that she noticed in mid-October 2024. The lump was causing discomfort, particularly when she is moving or wearing a bra. The patient reports that the area around the lump is tender and warm to the touch. She has been taking ibuprofen  to manage the discomfort. She is right-handed and has been trying to avoid using her right arm due to the discomfort from the lump.    Since pain and swelling persisted, she presented to the ED on 05/07/2023.  A CT chest with contrast was obtained on 05/07/2023 which showed 5 x 4.2 cm mass within the right breast with diffuse cutaneous thickening overlying the right breast, highly suspicious for primary breast malignancy.  Right axillary lymphadenopathy measuring up to 13 mm, suspicious for  metastatic disease.  A 3 mm subpleural right upper lobe lung nodule, indeterminate. Coronary artery calcifications and left atrial enlargement was also noted.   ED provider reached out to our clinic for further assistance and management.  We arranged for diagnostic mammogram, ultrasound and biopsy.   On 05/22/2023, diagnostic mammogram showed 5.3 cm highly suspicious mass in the right breast associated with adjacent inflammation and overlying skin thickening consistent with inflammatory breast carcinoma.  3-4 abnormal right axillary lymph nodes, suspicious for metastatic disease.  2 adjacent benign clusters of cysts in the left breast.  No evidence of left breast malignancy.  Ultrasound showed similar findings.   On 05/23/2023 she underwent core needle biopsy of right breast mass.  Pathology showed invasive ductal carcinoma, mitotic score 3, grade 3.  ER weakly positive at 10%.  PR negative, HER2/neu was 2+ by IHC, negative by FISH.  High Ki-67 index of 80%.   With these findings, she presented to clinic to establish care with us  on 06/06/2023.  Request submitted for PET/CT and echocardiogram.   On 06/13/2023, staging PET/CT showed intense metabolic activity within the right breast mass consistent with primary breast carcinoma.  Hypermetabolic right axillary and subpectoralis lymph nodes consistent with local nodal metastasis.  No evidence of distant metastatic disease.    Given no evidence of metastatic disease, plan is to treat her with  neoadjuvant chemoimmunotherapy using KEYNOTE-522 protocol (carboplatin  and paclitaxel  plus Keytruda  for 4 cycles followed by Adriamycin , Cytoxan  and Keytruda  for 4 cycles followed by 9 more doses of adjuvant Keytruda ).  Started systemic treatments from 07/04/2023.  Started cycle 1 of chemotherapy on 07/04/2023.  She received Keytruda  first.  Later she started Taxol  and few minutes into infusion, she had severe infusion reaction with chest pain, hypotension, diaphoresis,  hypoxia.  EKG was unremarkable.  Blood pressure and O2 sats normalized after additional allergy medication and supplemental oxygen.   We will not rechallenge her with Taxol .   Plan made to switch treatments to Taxotere  and cyclophosphamide  regimen, to be given every 3 weeks for up to 6 cycles.  On 07/13/2023, she had infusion reaction to Taxotere  as well, although not as severe as she had with Taxol .  She mainly had persistent lower back pain, increased blood pressure and some chest discomfort, which resolved quickly with pausing the infusion and Tylenol .   Plan made to switch treatments to dose dense AC, followed by weekly Abraxane .  Started this from 07/27/2023.   Oncology History  Inflammatory carcinoma of breast, right (HCC)  06/08/2023 Initial Diagnosis   Inflammatory carcinoma of breast, right (HCC)   06/15/2023 Cancer Staging   Staging form: Breast, AJCC 8th Edition - Clinical: Stage IIIC (cT4b, cN2, cM0, G3, ER+, PR-, HER2-) - Signed by Arlo Berber, MD on 06/15/2023 Histologic grading system: 3 grade system   07/04/2023 - 07/04/2023 Chemotherapy   Patient is on Treatment Plan : BREAST Pembrolizumab  (200) D1 + Carboplatin  (5) D1 + Paclitaxel  (80) D1,8,15 q21d X 4 cycles / Pembrolizumab  (200) D1 + AC D1 q21d x 4 cycles     07/13/2023 - 07/14/2023 Chemotherapy   Patient is on Treatment Plan : BREAST TC q21d     07/27/2023 -  Chemotherapy   Patient is on Treatment Plan : BREAST DOSE DENSE AC q14d / Nab-Paclitaxel  (Abraxane ) q7d         REVIEW OF SYSTEMS:   Review of Systems - Oncology  All other pertinent systems were reviewed with the patient and are negative.  ALLERGIES: She is allergic to paclitaxel , docetaxel , lisinopril , and fosaprepitant .  MEDICATIONS:  Current Outpatient Medications  Medication Sig Dispense Refill   amLODipine  (NORVASC ) 10 MG tablet Take 1 tablet (10 mg total) by mouth daily. 90 tablet 0   carvedilol  (COREG ) 12.5 MG tablet Take 12.5 mg by mouth.      furosemide  (LASIX ) 40 MG tablet TAKE 1 TABLET(40 MG) BY MOUTH DAILY 30 tablet 2   guaiFENesin -codeine  100-10 MG/5ML syrup Take 5 mLs by mouth every 4 (four) hours as needed for cough. 500 mL 0   irbesartan (AVAPRO) 300 MG tablet Take by mouth.     loratadine  (CLARITIN  REDITABS) 10 MG dissolvable tablet Take 1 tablet (10 mg total) by mouth daily. 30 tablet 1   ondansetron  (ZOFRAN -ODT) 8 MG disintegrating tablet Take 1 tablet (8 mg total) by mouth every 8 (eight) hours as needed for nausea or vomiting. 30 tablet 1   spironolactone (ALDACTONE) 25 MG tablet Take by mouth.     azithromycin  (ZITHROMAX ) 250 MG tablet Take 2 tablets by mouth once on day 1, then 1 tablet daily until complete 6 each 0   guaiFENesin -Codeine  200-10 MG/5ML LIQD Take 5 mLs by mouth 3 (three) times daily as needed (Cough). (Patient not taking: Reported on 10/03/2023) 500 mL 0   ibuprofen  (ADVIL ) 200 MG tablet Take 200 mg by mouth every 6 (six) hours as  needed. (Patient not taking: Reported on 08/03/2023)     lidocaine -diphenhydrAMINE -alum & mag hydroxide-simeth Take 5 mLs by mouth 3 (three) times daily as needed for mouth pain. (Patient not taking: Reported on 10/03/2023) 150 mL 2   loperamide  (IMODIUM ) 2 MG capsule Take 1-2 capsules (2-4 mg total) by mouth as needed for diarrhea or loose stools. (Patient not taking: Reported on 08/17/2023) 30 capsule 1   magic mouthwash (nystatin , hydrocortisone, diphenhydrAMINE , lidocaine ) suspension Swish and swallow 5 mLs 3 (three) times daily as needed for mouth pain. (Patient not taking: Reported on 10/03/2023) 500 mL 1   magic mouthwash (lidocaine , diphenhydrAMINE , alum & mag hydroxide) suspension Swish and swallow 5 mLs by mouth 3 (three) times daily as needed for mouth pain. (Patient not taking: Reported on 10/03/2023) 150 mL 1   prochlorperazine  (COMPAZINE ) 10 MG tablet Take 1 tablet (10 mg total) by mouth every 6 (six) hours as needed for nausea or vomiting.     traMADol  (ULTRAM ) 50 MG tablet  Take 1 tablet (50 mg total) by mouth every 6 (six) hours as needed. (Patient not taking: Reported on 08/03/2023) 90 tablet 0   No current facility-administered medications for this visit.     VITALS:   Blood pressure (!) 169/90, pulse 87, temperature (!) 97.2 F (36.2 C), temperature source Temporal, resp. rate 16, weight 293 lb 8 oz (133.1 kg), last menstrual period 01/17/2022, SpO2 100%.  Wt Readings from Last 3 Encounters:  10/03/23 293 lb 8 oz (133.1 kg)  09/21/23 299 lb 9.6 oz (135.9 kg)  09/19/23 296 lb 14.4 oz (134.7 kg)    Body mass index is 47.37 kg/m.   Onc Performance Status - 10/03/23 1044       ECOG Perf Status   ECOG Perf Status Restricted in physically strenuous activity but ambulatory and able to carry out work of a light or sedentary nature, e.g., light house work, office work      KPS SCALE   KPS % SCORE Able to carry on normal activity, minor s/s of disease                 PHYSICAL EXAM:   Physical Exam Constitutional:      General: She is not in acute distress.    Appearance: Normal appearance.  HENT:     Head: Normocephalic and atraumatic.     Mouth/Throat:     Comments: Scattered dark pigmentation on the tongue.  No definite signs of infection.  No thrush. Eyes:     General: No scleral icterus.    Conjunctiva/sclera: Conjunctivae normal.  Cardiovascular:     Rate and Rhythm: Normal rate and regular rhythm.     Heart sounds: Normal heart sounds.  Pulmonary:     Effort: Pulmonary effort is normal.     Breath sounds: Normal breath sounds.  Chest:     Comments: Breast exam deferred today.  Last exam from 08/29/2023 is as mentioned:  Previously noted erythema of the skin on the right breast has resolved.  No definite palpable lump at this time.  Scattered nodularity in the right breast, much improved compared to prior.  Overall excellent clinical response. Abdominal:     General: There is no distension.  Musculoskeletal:     Right lower leg:  Edema present.     Left lower leg: Edema present.  Neurological:     General: No focal deficit present.     Mental Status: She is alert and oriented to person, place, and time.  Psychiatric:        Mood and Affect: Mood normal.        Behavior: Behavior normal.        Thought Content: Thought content normal.      LABORATORY DATA:   I have reviewed the data as listed.  Results for orders placed or performed in visit on 10/03/23  CMP (Cancer Center only)  Result Value Ref Range   Sodium 141 135 - 145 mmol/L   Potassium 3.6 3.5 - 5.1 mmol/L   Chloride 106 98 - 111 mmol/L   CO2 25 22 - 32 mmol/L   Glucose, Bld 111 (H) 70 - 99 mg/dL   BUN 8 6 - 20 mg/dL   Creatinine 3.08 6.57 - 1.00 mg/dL   Calcium 9.1 8.9 - 84.6 mg/dL   Total Protein 6.9 6.5 - 8.1 g/dL   Albumin 4.1 3.5 - 5.0 g/dL   AST 20 15 - 41 U/L   ALT 17 0 - 44 U/L   Alkaline Phosphatase 111 38 - 126 U/L   Total Bilirubin 0.3 0.0 - 1.2 mg/dL   GFR, Estimated >96 >29 mL/min   Anion gap 10 5 - 15  CBC with Differential (Cancer Center Only)  Result Value Ref Range   WBC Count 8.6 4.0 - 10.5 K/uL   RBC 3.49 (L) 3.87 - 5.11 MIL/uL   Hemoglobin 9.7 (L) 12.0 - 15.0 g/dL   HCT 52.8 (L) 41.3 - 24.4 %   MCV 87.1 80.0 - 100.0 fL   MCH 27.8 26.0 - 34.0 pg   MCHC 31.9 30.0 - 36.0 g/dL   RDW 01.0 (H) 27.2 - 53.6 %   Platelet Count 163 150 - 400 K/uL   nRBC 0.2 0.0 - 0.2 %   Neutrophils Relative % PENDING %   Neutro Abs PENDING 1.7 - 7.7 K/uL   Band Neutrophils PENDING %   Lymphocytes Relative PENDING %   Lymphs Abs PENDING 0.7 - 4.0 K/uL   Monocytes Relative PENDING %   Monocytes Absolute PENDING 0.1 - 1.0 K/uL   Eosinophils Relative PENDING %   Eosinophils Absolute PENDING 0.0 - 0.5 K/uL   Basophils Relative PENDING %   Basophils Absolute PENDING 0.0 - 0.1 K/uL   WBC Morphology PENDING    RBC Morphology PENDING    Smear Review PENDING    Other PENDING %   nRBC PENDING 0 /100 WBC   Metamyelocytes Relative PENDING %    Myelocytes PENDING %   Promyelocytes Relative PENDING %   Blasts PENDING %   Immature Granulocytes PENDING %   Abs Immature Granulocytes PENDING 0.00 - 0.07 K/uL       RADIOGRAPHIC STUDIES:  I have personally reviewed the radiological images as listed and agree with the findings in the report.  No results found.   CODE STATUS:  Code Status History     Date Active Date Inactive Code Status Order ID Comments User Context   11/13/2015 0949 11/15/2015 1650 Full Code [id number]  Doroteo Gasmen, MD Inpatient       No orders of the defined types were placed in this encounter.    Future Appointments  Date Time Provider Department Center  10/05/2023 10:30 AM [facility] INFUSION [facility] None  10/12/2023 10:30 AM [facility] INFUSION [facility] None  10/12/2023 10:30 AM [facility] PHLEBOTOMIST [facility] None  10/12/2023 11:00 AM Burton, Lacie K, NP [facility] None  10/12/2023 12:30 PM [facility] INFUSION [facility] None  10/19/2023 10:00 AM [facility] PHLEBOTOMIST [facility] None  10/19/2023 10:15 AM [facility] INFUSION [facility] None  10/19/2023 10:30 AM Zoriyah Scheidegger, Gale Jude, MD [facility] None  10/19/2023 10:45 AM [facility] INFUSION [facility] None      This document was completed utilizing speech recognition software. Grammatical errors, random word insertions, pronoun errors, and incomplete sentences are an occasional consequence of this system due to software limitations, ambient noise, and hardware issues. Any formal questions or concerns about the content, text or information contained within the body of this dictation should be directly addressed to the provider for clarification.

## 2023-10-03 NOTE — Progress Notes (Signed)
 [facility] CSW Progress Note  Clinical Child psychotherapist met with patient during provider appointment. CSW submitted patient's SNAP application on her behalf. CSW sent patient email to [email or web address] with grant information.   Maudie Sorrow, LCSW Clinical Social Worker [facility]

## 2023-10-04 ENCOUNTER — Encounter: Payer: Self-pay | Admitting: Oncology

## 2023-10-04 ENCOUNTER — Telehealth: Payer: Self-pay

## 2023-10-04 NOTE — Assessment & Plan Note (Signed)
 Patient has a history of hypertension and was previously misdiagnosed with congestive heart failure. Currently on Amlodipine once daily, Carvedilol twice daily, irbesartan daily, spironolactone daily, and Furosemide once daily. -Continue current medications. -Emphasize the importance of good blood pressure control, especially in the context of ongoing chemotherapy.

## 2023-10-04 NOTE — Telephone Encounter (Signed)
 [facility] CSW Progress Note  Clinical Child psychotherapist contacted patient by phone to discuss [employer]. Patient meets eligibility for the alight grant based on enrollment in a government income based program (Child Care assistance (Vouchers) through DSS. CSW sent referral to patient financial specialist for full application.    Maudie Sorrow, LCSW Clinical Social Worker [facility]

## 2023-10-04 NOTE — Assessment & Plan Note (Signed)
 Recently diagnosed with right breast mass (6 cm) and biopsy confirmed malignancy. Estrogen receptor weakly positive at 10%, progesterone receptor negative, and HER2 negative. Left breast has a small mass (6-8 mm) not concerning for malignancy. No lymph node involvement on biopsy but ultrasound was concerning for 3-4 right axillary lymph nodes. Inflammatory breast cancer.  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  -Previously I discussed staging, prognosis, plan of care, treatment options.  Reviewed NCCN guidelines.  On 06/13/2023, staging PET/CT showed intense metabolic activity within the right breast mass consistent with primary breast carcinoma.  Hypermetabolic right axillary and subpectoralis lymph nodes consistent with local nodal metastasis.  No evidence of distant metastatic disease.    Plan made to proceed with cycle 1, day 1 of carboplatin , paclitaxel  and Keytruda  on 07/04/2023. She received Keytruda  first.  Later she started Taxol  and few minutes into infusion, she had severe infusion reaction with chest pain, hypotension, diaphoresis, hypoxia.  EKG was unremarkable.  Blood pressure and O2 sats normalized after additional allergy medication and supplemental oxygen. We will not rechallenge her with Taxol . Plan made to switch treatments to Taxotere  and cyclophosphamide  regimen, to be given every 3 weeks for up to 6 cycles.  On 07/13/2023, she had infusion reaction to Taxotere  as well, although not as severe as she had with Taxol .  She mainly had persistent lower back pain, increased blood pressure and some chest discomfort, which resolved quickly with pausing the infusion and Tylenol .  Plan made to switch treatments to dose dense AC, followed by weekly Abraxane . Started AC from 07/27/2023.  Received 4 cycles of AC so far.  Skin appears near normal with no palpable tumor, indicating a good response to treatment. Continued chemotherapy is planned to target any remaining microscopic disease  to optimize surgical outcomes. Nab-paclitaxel  is chosen for its reduced risk of cross-reaction compared to previous treatments. Close monitoring is necessary due to previous allergic reactions to medications. The goal is to achieve no residual cancer by the time of surgery for the best long-term outcome.  She is scheduled to begin cycle 1 of Abraxane  on 10/05/2023.  Abraxane  will be continued weekly for up to 12 cycles.  Due to previous adverse reactions to chemotherapy, the upcoming switch to an nab-paclitaxel  will be administered with caution.  Generally speaking, there is no cross-reactivity for nab-paclitaxel , anticipated to be well-tolerated due to its protein-bound nature.   Labs today reveal no dose-limiting toxicities.  Will proceed with Abraxane  as scheduled.  Per patient preference, we will continue chemotherapy on Thursdays going forward, to accommodate her work schedule.  If she has significant reaction to Abraxane  also, then we will discuss stopping neoadjuvant chemotherapy and proceeding with surgery.

## 2023-10-05 ENCOUNTER — Other Ambulatory Visit: Payer: Self-pay

## 2023-10-05 ENCOUNTER — Encounter: Payer: Self-pay | Admitting: Nurse Practitioner

## 2023-10-05 ENCOUNTER — Ambulatory Visit

## 2023-10-05 ENCOUNTER — Telehealth: Payer: Self-pay

## 2023-10-05 ENCOUNTER — Inpatient Hospital Stay

## 2023-10-05 ENCOUNTER — Encounter: Payer: Self-pay | Admitting: Oncology

## 2023-10-05 ENCOUNTER — Inpatient Hospital Stay: Attending: Oncology | Admitting: Nurse Practitioner

## 2023-10-05 DIAGNOSIS — Z452 Encounter for adjustment and management of vascular access device: Secondary | ICD-10-CM | POA: Insufficient documentation

## 2023-10-05 DIAGNOSIS — C50411 Malignant neoplasm of upper-outer quadrant of right female breast: Secondary | ICD-10-CM | POA: Insufficient documentation

## 2023-10-05 DIAGNOSIS — Z170 Estrogen receptor positive status [ER+]: Secondary | ICD-10-CM | POA: Insufficient documentation

## 2023-10-05 DIAGNOSIS — C50911 Malignant neoplasm of unspecified site of right female breast: Secondary | ICD-10-CM

## 2023-10-05 NOTE — Progress Notes (Signed)
 [facility]     [phone] [phone]    Patient Care Team: Charlane Cones as PCP - General (Physician Assistant)   CHIEF COMPLAINT: symptom management, anxiety   CURRENT THERAPY: Neoadjuvant chemo, completed AC, pending first abraxane  today  INTERVAL HISTORY [name] is seen in the infusion room, here for first abraxane . Extremely anxious about possibility of reacting to yet another chemo drug. Tearful, does not want to proceed.    Past Medical History:  Diagnosis Date   Herpes simplex    states hasn't had outbreak since she was "young"   Hypertension      Past Surgical History:  Procedure Laterality Date   BREAST BIOPSY Right 05/23/2023   US  RT BREAST BX W LOC DEV 1ST LESION IMG BX SPEC US  GUIDE 05/23/2023 [facility] MAMMOGRAPHY   CARPAL TUNNEL RELEASE  1996 & 1999   bilateral wrist   IR CV LINE INJECTION  07/13/2023   IR IMAGING GUIDED PORT INSERTION  07/03/2023   IR IMAGING GUIDED PORT INSERTION  07/20/2023   IR REMOVAL TUN ACCESS W/ PORT W/O FL MOD SED  07/20/2023     Outpatient Encounter Medications as of 10/05/2023  Medication Sig Note   amLODipine  (NORVASC ) 10 MG tablet Take 1 tablet (10 mg total) by mouth daily.    azithromycin  (ZITHROMAX ) 250 MG tablet Take 2 tablets by mouth once on day 1, then 1 tablet daily until complete    carvedilol  (COREG ) 12.5 MG tablet Take 12.5 mg by mouth. 06/19/2023: Taking in the morning then at night- 2x a day   furosemide  (LASIX ) 40 MG tablet TAKE 1 TABLET(40 MG) BY MOUTH DAILY 09/12/2023: Did not take lasix  yet due to appt   guaiFENesin -codeine  100-10 MG/5ML syrup Take 5 mLs by mouth every 4 (four) hours as needed for cough.    guaiFENesin -Codeine  200-10 MG/5ML LIQD Take 5 mLs by mouth 3 (three) times daily as needed (Cough). (Patient not taking: Reported on 10/03/2023)    ibuprofen  (ADVIL ) 200 MG tablet Take 200 mg by mouth every 6 (six) hours as needed. (Patient not taking: Reported on  08/03/2023)    irbesartan (AVAPRO) 300 MG tablet Take by mouth.    lidocaine -diphenhydrAMINE -alum & mag hydroxide-simeth Take 5 mLs by mouth 3 (three) times daily as needed for mouth pain. (Patient not taking: Reported on 10/03/2023)    loperamide  (IMODIUM ) 2 MG capsule Take 1-2 capsules (2-4 mg total) by mouth as needed for diarrhea or loose stools. (Patient not taking: Reported on 08/17/2023)    loratadine  (CLARITIN  REDITABS) 10 MG dissolvable tablet Take 1 tablet (10 mg total) by mouth daily.    magic mouthwash (nystatin , hydrocortisone, diphenhydrAMINE , lidocaine ) suspension Swish and swallow 5 mLs 3 (three) times daily as needed for mouth pain. (Patient not taking: Reported on 10/03/2023)    magic mouthwash (lidocaine , diphenhydrAMINE , alum & mag hydroxide) suspension Swish and swallow 5 mLs by mouth 3 (three) times daily as needed for mouth pain. (Patient not taking: Reported on 10/03/2023) 09/12/2023: Patient instructed to pick up her Magic Mouth wash as she is having throat pain.   ondansetron  (ZOFRAN -ODT) 8 MG disintegrating tablet Take 1 tablet (8 mg total) by mouth every 8 (eight) hours as needed for nausea or vomiting.    prochlorperazine  (COMPAZINE ) 10 MG tablet Take 1 tablet (10 mg total) by mouth every 6 (six) hours as needed for nausea or vomiting.    spironolactone (ALDACTONE) 25 MG tablet Take by  mouth. 06/19/2023: Taking 4 a day- 100mg    traMADol  (ULTRAM ) 50 MG tablet Take 1 tablet (50 mg total) by mouth every 6 (six) hours as needed. (Patient not taking: Reported on 08/03/2023)    No facility-administered encounter medications on file as of 10/05/2023.     There were no vitals filed for this visit. There is no height or weight on file to calculate BMI.    PHYSICAL EXAM GENERAL:alert, tearful, anxious but otherwise in no distress LUNGS:  normal breathing effort NEURO: alert & oriented x 3 with fluent speech  CBC    Latest Ref Rng & Units 10/03/2023   10:02 AM 09/19/2023   11:05 AM  09/12/2023   10:31 AM  CBC  WBC 4.0 - 10.5 K/uL 8.6  6.9  7.5   Hemoglobin 12.0 - 15.0 g/dL 9.7  16.1  09.6   Hematocrit 36.0 - 46.0 % 30.4  32.7  32.7   Platelets 150 - 400 K/uL 163  329  202       CMP     Latest Ref Rng & Units 10/03/2023   10:02 AM 09/19/2023   11:05 AM 09/12/2023   10:31 AM  CMP  Glucose 70 - 99 mg/dL 045  409  811   BUN 6 - 20 mg/dL 8  10  7    Creatinine 0.44 - 1.00 mg/dL 9.14  7.82  9.56   Sodium 135 - 145 mmol/L 141  137  138   Potassium 3.5 - 5.1 mmol/L 3.6  3.6  3.6   Chloride 98 - 111 mmol/L 106  104  104   CO2 22 - 32 mmol/L 25  26  26    Calcium 8.9 - 10.3 mg/dL 9.1  8.8  8.7   Total Protein 6.5 - 8.1 g/dL 6.9  7.1  7.3   Total Bilirubin 0.0 - 1.2 mg/dL 0.3  0.4  0.3   Alkaline Phos 38 - 126 U/L 111  86  90   AST 15 - 41 U/L 20  16  20    ALT 0 - 44 U/L 17  20  21        ASSESSMENT & PLAN: 53 year old female   Inflammatory carcinoma of the right breast, 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 Hypermetabolic right axillary and subpectoralis lymph nodes consistent with local nodal metastasis. No evidence of distant metastatic disease.  -Echo 06/13/23 showed preserved EF -Began neoadjuvant chemo per [id number] protocol, unfortunately experienced Taxol  and Taxotere  reactions and is now currently on ddAC q. 2 weeks x 4 with G-CSF to be followed by weekly Abraxane , and has received 1 dose of pembrolizumab  (on hold during the course of AC per Dr. Randye Buttner) -S/p 4 cycles of AC with GCSF    Disposition:  Ms. Pasley appears anxious, very reluctant to proceed with Abraxane  due to her previous severe reactions to Taxol  and Taxotere . We discussed the lower risk of hypersensitivity/allergy with Abraxane  in general and pre-meds will be given, but can not predict her response. I offered anti-anxiety medication. She understands  AC without Abraxane  is not standard of care.   After lengthy discussion she opted not to proceed with Abraxane . Concerns were validated. Dr. Randye Buttner aware. Plan to go ahead with restaging breast MRI and f/up with her surgeon. Will CC Dr. Delane Fear.    Orders Placed This Encounter  Procedures   MR BREAST BILATERAL W WO CONTRAST INC  CAD    Standing Status:   Future    Expected Date:   10/11/2023    Expiration Date:   10/04/2024    If indicated for the ordered procedure, I authorize the administration of contrast media per Radiology protocol:   Yes    What is the patient's sedation requirement?:   No Sedation    Does the patient have a pacemaker or implanted devices?:   No    Preferred imaging location?:   [facility]      All questions were answered. The patient knows to call the clinic with any problems, questions or concerns. No barriers to learning were detected.  Fernanda Twaddell K [place] Jarquin, NP 10/05/2023

## 2023-10-05 NOTE — Progress Notes (Signed)
 No tx today per Lacie Buton, NP. Pt provided drink, discharged from [facility] with no complaints. Pt is aware to call [facility] with further concerns

## 2023-10-05 NOTE — Telephone Encounter (Signed)
 Spoke to [place] scheduler at [facility] [place] to schedule a Breast MRI, only availability was 10/10/23 at 12:00, stated would hold a spot for this patient while waiting on prior authorization. Robynn Christian worked on  Prior authorization, this company is not in [facility], only [facility]. Followed back up with [facility], made known that this patients insurance is not in network with them. Understood.   04:37 pm Passed patients information to Dr. Wende Halo nurse, Gareld June RN to look into this tomorrow. Will follow up next week.

## 2023-10-05 NOTE — Patient Instructions (Signed)
Paclitaxel Nanoparticle Albumin-Bound Injection What is this medication? NANOPARTICLE ALBUMIN-BOUND PACLITAXEL (Na no PAHR ti kuhl al BYOO muhn-bound PAK li TAX el) treats some types of cancer. It works by slowing down the growth of cancer cells. This medicine may be used for other purposes; ask your health care provider or pharmacist if you have questions. COMMON BRAND NAME(S): Abraxane What should I tell my care team before I take this medication? They need to know if you have any of these conditions: Liver disease Low white blood cell levels An unusual or allergic reaction to paclitaxel, albumin, other medications, foods, dyes, or preservatives If you or your partner are pregnant or trying to get pregnant Breast-feeding How should I use this medication? This medication is injected into a vein. It is given by your care team in a hospital or clinic setting. Talk to your care team about the use of this medication in children. Special care may be needed. Overdosage: If you think you have taken too much of this medicine contact a poison control center or emergency room at once. NOTE: This medicine is only for you. Do not share this medicine with others. What if I miss a dose? Keep appointments for follow-up doses. It is important not to miss your dose. Call your care team if you are unable to keep an appointment. What may interact with this medication? Other medications may affect the way this medication works. Talk with your care team about all of the medications you take. They may suggest changes to your treatment plan to lower the risk of side effects and to make sure your medications work as intended. This list may not describe all possible interactions. Give your health care provider a list of all the medicines, herbs, non-prescription drugs, or dietary supplements you use. Also tell them if you smoke, drink alcohol, or use illegal drugs. Some items may interact with your medicine. What  should I watch for while using this medication? Your condition will be monitored carefully while you are receiving this medication. You may need blood work while taking this medication. This medication may make you feel generally unwell. This is not uncommon as chemotherapy can affect healthy cells as well as cancer cells. Report any side effects. Continue your course of treatment even though you feel ill unless your care team tells you to stop. This medication can cause serious allergic reactions. To reduce the risk, your care team may give you other medications to take before receiving this one. Be sure to follow the directions from your care team. This medication may increase your risk of getting an infection. Call your care team for advice if you get a fever, chills, sore throat, or other symptoms of a cold or flu. Do not treat yourself. Try to avoid being around people who are sick. This medication may increase your risk to bruise or bleed. Call your care team if you notice any unusual bleeding. Be careful brushing or flossing your teeth or using a toothpick because you may get an infection or bleed more easily. If you have any dental work done, tell your dentist you are receiving this medication. Talk to your care team if you or your partner may be pregnant. Serious birth defects can occur if you take this medication during pregnancy and for 6 months after the last dose. You will need a negative pregnancy test before starting this medication. Contraception is recommended while taking this medication and for 6 months after the last dose. Your care team   can help you find the option that works for you. If your partner can get pregnant, use a condom during sex while taking this medication and for 3 months after the last dose. Do not breastfeed while taking this medication and for 2 weeks after the last dose. This medication may cause infertility. Talk to your care team if you are concerned about your  fertility. What side effects may I notice from receiving this medication? Side effects that you should report to your care team as soon as possible: Allergic reactions--skin rash, itching, hives, swelling of the face, lips, tongue, or throat Dry cough, shortness of breath or trouble breathing Infection--fever, chills, cough, sore throat, wounds that don't heal, pain or trouble when passing urine, general feeling of discomfort or being unwell Low red blood cell level--unusual weakness or fatigue, dizziness, headache, trouble breathing Pain, tingling, or numbness in the hands or feet Stomach pain, unusual weakness or fatigue, nausea, vomiting, diarrhea, or fever that lasts longer than expected Unusual bruising or bleeding Side effects that usually do not require medical attention (report to your care team if they continue or are bothersome): Diarrhea Fatigue Hair loss Loss of appetite Nausea Vomiting This list may not describe all possible side effects. Call your doctor for medical advice about side effects. You may report side effects to FDA at 1-800-FDA-1088. Where should I keep my medication? This medication is given in a hospital or clinic. It will not be stored at home. NOTE: This sheet is a summary. It may not cover all possible information. If you have questions about this medicine, talk to your doctor, pharmacist, or health care provider.  2024 Elsevier/Gold Standard (2021-10-07 00:00:00)  

## 2023-10-10 ENCOUNTER — Inpatient Hospital Stay
Admission: RE | Admit: 2023-10-10 | Discharge: 2023-10-10 | Disposition: A | Discharge status: Home / Self Care | Payer: Self-pay | Source: Ambulatory Visit | Attending: Oncology | Admitting: Oncology

## 2023-10-10 ENCOUNTER — Other Ambulatory Visit: Payer: Self-pay

## 2023-10-10 ENCOUNTER — Telehealth: Payer: Self-pay

## 2023-10-10 DIAGNOSIS — C50911 Malignant neoplasm of unspecified site of right female breast: Secondary | ICD-10-CM

## 2023-10-10 NOTE — Progress Notes (Signed)
 Entered in error

## 2023-10-10 NOTE — Telephone Encounter (Signed)
 Spoke with

## 2023-10-11 ENCOUNTER — Telehealth: Payer: Self-pay

## 2023-10-11 NOTE — Telephone Encounter (Signed)
 A phone call was placed to the patient to inform her that her Lab, Flush with Lab, MD office visit, and infusion appointments scheduled for Thursday, 10/12/2023, have been canceled pending the results of her MRI. The patient was agreeable and verbalized understanding of the change.

## 2023-10-11 NOTE — Telephone Encounter (Signed)
 Contacted Lacie Burton NP to confirm cancellation of patients labs/flush, follow-up, infusion on 10/11/23 and 10/19/23. confirmed. Patient is scheduled for a Breast MRI at [facility] on 10/14/23 at 3pm, per provider wants patient to have labs/flush, follow-up visit on Tuesday 10/17/23, with Dr. Randye Buttner to go over MRI results, go over the next steps in treatment or speaking with a surgeon.  Patient is refusing Abraxane  due to previous reactions in the past with other Chemo drugs that are similar.  Spoke with patient about cancellation of appointments, the scheduler will contact to set up future appointment for 10/17/23. Confirmed will be going to scheduled appointment on 10/14/23 for Breast MRI.Understood, no further questions.

## 2023-10-11 NOTE — Telephone Encounter (Signed)
 Contacted [facility] inquiring about scheduling patients Breast MRI at that location. Due to patient having multiple insurances that are only in network with certain locations, [employer] is a Breast Cancer grant funded which is eligible at this location.Scheduled patient 10/14/23 at 3pm, arriving 20 minutes early, check in at the [facility] hospital emergency room.  0454- Unable to reach patient via phone, left a voicemail stating date, location, time, place of scheduled breast MRI. Patient actively uses mychart, sent a message regarding scheduled appointment.  Spoke with Odilia Bennett at [facility] to cancel patients upcoming Breast MRI. Confirmed cancellation.

## 2023-10-11 NOTE — Telephone Encounter (Signed)
 [facility] called, to verify patients authorization number to match from an order that was sent over via fax yesterday from Debi Fall at [facility] for a Breast MRI to be scheduled. Verified authorization number- [id number] valid through 05/30. Confirmed, stated would start the scheduling process.

## 2023-10-12 ENCOUNTER — Telehealth: Payer: Self-pay

## 2023-10-12 ENCOUNTER — Inpatient Hospital Stay

## 2023-10-12 ENCOUNTER — Inpatient Hospital Stay: Admitting: Nurse Practitioner

## 2023-10-12 NOTE — Telephone Encounter (Signed)
 Unable to reach, left a voicemail to call the [facility] in regards patient needs to get in touch with [employer] to cancel insurance immediately. After [employer] is cancelled, Manage care Lorean Rodes will need to be notified in order to contact [employer] for the Authorization with [facility]. (Secondary insurance/[employer] Medicaid) Patient will need to be scheduled for a Breast MRI after primary insurance is dropped/ authorization is approved. Will inform nurse supervisor to follow-up.

## 2023-10-13 ENCOUNTER — Telehealth: Payer: Self-pay

## 2023-10-13 NOTE — Telephone Encounter (Signed)
 Spoke with patient's sister for awhile on the phone and explained the MRI situation that is scheduled tomorrow. Sister was very willing to help and many questions were answered with the help of Shade Darby, RN per secure chat. The sister is going to try and speak with patient so that patient might cancel her [employer] health insurance in order to allow the [employer] Medicaid Program to fund the MRI. Sister is aware of small window of time to get this done for tomorrow's MRI.

## 2023-10-13 NOTE — Telephone Encounter (Signed)
 Patient returned call. She expressed concern to have her BCBS discontinued due to other providers she sees under this insurance at [facility]. Patient is interested in getting the MRI done at [facility] and agreed to call me next Wed or Thursday at this time.

## 2023-10-13 NOTE — Telephone Encounter (Signed)
 LVM just now on patient cell phone regarding her MRI that was planned for tomorrow. Asked patient for her help with the insurance and to please call me back.

## 2023-10-14 ENCOUNTER — Ambulatory Visit (HOSPITAL_COMMUNITY)

## 2023-10-16 ENCOUNTER — Telehealth: Payer: Self-pay | Admitting: Oncology

## 2023-10-16 NOTE — Telephone Encounter (Signed)
 Pt was referred by Maudie Sorrow, LCSW Clinical Social Worker for possible assistance of the [employer].  I left  vm for a return call back.

## 2023-10-17 ENCOUNTER — Inpatient Hospital Stay

## 2023-10-17 ENCOUNTER — Encounter: Payer: Self-pay | Admitting: Oncology

## 2023-10-17 ENCOUNTER — Telehealth: Payer: Self-pay | Admitting: Oncology

## 2023-10-17 ENCOUNTER — Other Ambulatory Visit: Payer: Self-pay

## 2023-10-17 ENCOUNTER — Inpatient Hospital Stay (HOSPITAL_BASED_OUTPATIENT_CLINIC_OR_DEPARTMENT_OTHER): Admitting: Oncology

## 2023-10-17 VITALS — BP 187/88 | HR 82 | Temp 98.2°F | Resp 16 | Wt 295.1 lb

## 2023-10-17 DIAGNOSIS — C50911 Malignant neoplasm of unspecified site of right female breast: Secondary | ICD-10-CM

## 2023-10-17 DIAGNOSIS — I1A0 Resistant hypertension: Secondary | ICD-10-CM

## 2023-10-17 DIAGNOSIS — Z170 Estrogen receptor positive status [ER+]: Secondary | ICD-10-CM | POA: Diagnosis not present

## 2023-10-17 DIAGNOSIS — Z452 Encounter for adjustment and management of vascular access device: Secondary | ICD-10-CM | POA: Diagnosis not present

## 2023-10-17 DIAGNOSIS — C50411 Malignant neoplasm of upper-outer quadrant of right female breast: Secondary | ICD-10-CM | POA: Diagnosis present

## 2023-10-17 DIAGNOSIS — Z95828 Presence of other vascular implants and grafts: Secondary | ICD-10-CM

## 2023-10-17 LAB — CBC WITH DIFFERENTIAL (CANCER CENTER ONLY)
Abs Immature Granulocytes: 0.02 10*3/uL (ref 0.00–0.07)
Basophils Absolute: 0.1 10*3/uL (ref 0.0–0.1)
Basophils Relative: 1 %
Eosinophils Absolute: 0.1 10*3/uL (ref 0.0–0.5)
Eosinophils Relative: 1 %
HCT: 32.7 % — ABNORMAL LOW (ref 36.0–46.0)
Hemoglobin: 10.4 g/dL — ABNORMAL LOW (ref 12.0–15.0)
Immature Granulocytes: 0 %
Lymphocytes Relative: 13 %
Lymphs Abs: 0.7 10*3/uL (ref 0.7–4.0)
MCH: 27.6 pg (ref 26.0–34.0)
MCHC: 31.8 g/dL (ref 30.0–36.0)
MCV: 86.7 fL (ref 80.0–100.0)
Monocytes Absolute: 1 10*3/uL (ref 0.1–1.0)
Monocytes Relative: 18 %
Neutro Abs: 3.8 10*3/uL (ref 1.7–7.7)
Neutrophils Relative %: 67 %
Platelet Count: 284 10*3/uL (ref 150–400)
RBC: 3.77 MIL/uL — ABNORMAL LOW (ref 3.87–5.11)
RDW: 17.8 % — ABNORMAL HIGH (ref 11.5–15.5)
WBC Count: 5.7 10*3/uL (ref 4.0–10.5)
nRBC: 0 % (ref 0.0–0.2)

## 2023-10-17 LAB — CMP (CANCER CENTER ONLY)
ALT: 18 U/L (ref 0–44)
AST: 19 U/L (ref 15–41)
Albumin: 3.8 g/dL (ref 3.5–5.0)
Alkaline Phosphatase: 93 U/L (ref 38–126)
Anion gap: 9 (ref 5–15)
BUN: 9 mg/dL (ref 6–20)
CO2: 25 mmol/L (ref 22–32)
Calcium: 9.2 mg/dL (ref 8.9–10.3)
Chloride: 103 mmol/L (ref 98–111)
Creatinine: 0.82 mg/dL (ref 0.44–1.00)
GFR, Estimated: >60 mL/min (ref >60)
Glucose, Bld: 91 mg/dL (ref 70–99)
Potassium: 4.1 mmol/L (ref 3.5–5.1)
Sodium: 137 mmol/L (ref 135–145)
Total Bilirubin: 0.4 mg/dL (ref 0.0–1.2)
Total Protein: 7.1 g/dL (ref 6.5–8.1)

## 2023-10-17 MED ORDER — HEPARIN SOD (PORK) LOCK FLUSH 100 UNIT/ML IV SOLN
500.0000 [IU] | Freq: Once | INTRAVENOUS | Status: AC
  Administered 2023-10-17: 500 [IU] via INTRAVENOUS

## 2023-10-17 MED ORDER — SODIUM CHLORIDE 0.9% FLUSH
10.0000 mL | Freq: Once | INTRAVENOUS | Status: AC
  Administered 2023-10-17: 10 mL via INTRAVENOUS

## 2023-10-17 NOTE — Progress Notes (Signed)
 Spoke with patient today in person and she stated, "I want to keep my [employer]. She is willing to get her MRI at [facility] and has a Breast MRI scheduled on May 28th at that locations.

## 2023-10-17 NOTE — Assessment & Plan Note (Signed)
 Recently diagnosed with right breast mass (6 cm) and biopsy confirmed malignancy. Estrogen receptor weakly positive at 10%, progesterone receptor negative, and HER2 negative. Left breast has a small mass (6-8 mm) not concerning for malignancy. No lymph node involvement on biopsy but ultrasound was concerning for 3-4 right axillary lymph nodes. Inflammatory breast cancer.  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  -Previously I discussed staging, prognosis, plan of care, treatment options.  Reviewed NCCN guidelines.  On 06/13/2023, staging PET/CT showed intense metabolic activity within the right breast mass consistent with primary breast carcinoma.  Hypermetabolic right axillary and subpectoralis lymph nodes consistent with local nodal metastasis.  No evidence of distant metastatic disease.    Plan made to proceed with cycle 1, day 1 of carboplatin , paclitaxel  and Keytruda  on 07/04/2023. She received Keytruda  first.  Later she started Taxol  and few minutes into infusion, she had severe infusion reaction with chest pain, hypotension, diaphoresis, hypoxia.  EKG was unremarkable.  Blood pressure and O2 sats normalized after additional allergy medication and supplemental oxygen. We will not rechallenge her with Taxol . Plan made to switch treatments to Taxotere  and cyclophosphamide  regimen, to be given every 3 weeks for up to 6 cycles.  On 07/13/2023, she had infusion reaction to Taxotere  as well, although not as severe as she had with Taxol .  She mainly had persistent lower back pain, increased blood pressure and some chest discomfort, which resolved quickly with pausing the infusion and Tylenol .  Plan made to switch treatments to dose dense AC, followed by weekly Abraxane . Started AC from 07/27/2023.  Received 4 cycles of AC so far.  Skin appears near normal with no palpable tumor, indicating a good response to treatment. Continued chemotherapy was planned to target any remaining microscopic  disease to optimize surgical outcomes. Nab-paclitaxel  was chosen for its reduced risk of cross-reaction compared to previous treatments. The goal was to achieve no residual cancer by the time of surgery for the best long-term outcome.  On 10/05/2023, when she presented to start weekly Abraxane , patient got very anxious in the treatment room and had panic attacks because of previous experiences with Taxol  and Taxotere .  After thorough discussion, patient opted not to proceed with Abraxane .  Plan is to proceed with MRI of the breast followed by surgery.  Adjuvant treatment will be considered depending on pathology findings.   Patient ran into some insurance issues with her MRI scheduling.  We are currently in the process of arranging this at [facility] as per her insurance, tentatively scheduled on 11/01/2023.  Dr. Delane Fear is aware of this and will plan surgery depending on MRI findings.  I will plan to see her in the postoperative setting.

## 2023-10-17 NOTE — Assessment & Plan Note (Addendum)
 Patient has a history of hypertension and was previously misdiagnosed with congestive heart failure. Currently on Amlodipine  once daily, Carvedilol  twice daily, irbesartan daily, spironolactone daily, and Furosemide  once daily. -Continue current medications. -Emphasize the importance of good blood pressure control, especially in the context of chemotherapy.

## 2023-10-17 NOTE — Telephone Encounter (Signed)
 Reached out to patient to review insurance as part of coordination of care.  Left voice message to return call.

## 2023-10-17 NOTE — Progress Notes (Signed)
 [facility]  ONCOLOGY CLINIC PROGRESS NOTE   Patient Care Team: Charlane Cones as PCP - General (Physician Assistant)  PATIENT NAME: Jasmine Novak   MR#: [id number] DOB: 30-Jun-1970  Date of visit: 10/17/2023   ASSESSMENT & PLAN:   Jasmine Novak is a 52 y.o. lady with a past medical history of hypertension, was referred to our clinic in December 2024 for newly diagnosed inflammatory carcinoma of right breast.  ER weakly positive at 10%, PR negative, HER2/neu negative.  Clinically behaving like triple negative breast cancer.  Had severe infusion reaction to first dose of Taxol  and later moderate reaction to Taxotere .  Treatment switched to dose dense AC, to be followed by weekly Abraxane .  Patient completed 4 cycles of dose dense AC but refused to receive Abraxane  for fear of severe allergic reaction.  Inflammatory carcinoma of breast, right (HCC) Recently diagnosed with right breast mass (6 cm) and biopsy confirmed malignancy. Estrogen receptor weakly positive at 10%, progesterone receptor negative, and HER2 negative. Left breast has a small mass (6-8 mm) not concerning for malignancy. No lymph node involvement on biopsy but ultrasound was concerning for 3-4 right axillary lymph nodes. Inflammatory breast cancer.  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  -Previously I discussed staging, prognosis, plan of care, treatment options.  Reviewed NCCN guidelines.  On 06/13/2023, staging PET/CT showed intense metabolic activity within the right breast mass consistent with primary breast carcinoma.  Hypermetabolic right axillary and subpectoralis lymph nodes consistent with local nodal metastasis.  No evidence of distant metastatic disease.    Plan made to proceed with cycle 1, day 1 of carboplatin , paclitaxel  and Keytruda  on 07/04/2023. She received Keytruda  first.  Later she started Taxol  and few minutes into infusion, she had severe infusion reaction with  chest pain, hypotension, diaphoresis, hypoxia.  EKG was unremarkable.  Blood pressure and O2 sats normalized after additional allergy medication and supplemental oxygen. We will not rechallenge her with Taxol . Plan made to switch treatments to Taxotere  and cyclophosphamide  regimen, to be given every 3 weeks for up to 6 cycles.  On 07/13/2023, she had infusion reaction to Taxotere  as well, although not as severe as she had with Taxol .  She mainly had persistent lower back pain, increased blood pressure and some chest discomfort, which resolved quickly with pausing the infusion and Tylenol .  Plan made to switch treatments to dose dense AC, followed by weekly Abraxane . Started AC from 07/27/2023.  Received 4 cycles of AC so far.  Skin appears near normal with no palpable tumor, indicating a good response to treatment. Continued chemotherapy was planned to target any remaining microscopic disease to optimize surgical outcomes. Nab-paclitaxel  was chosen for its reduced risk of cross-reaction compared to previous treatments. The goal was to achieve no residual cancer by the time of surgery for the best long-term outcome.  On 10/05/2023, when she presented to start weekly Abraxane , patient got very anxious in the treatment room and had panic attacks because of previous experiences with Taxol  and Taxotere .  After thorough discussion, patient opted not to proceed with Abraxane .  Plan is to proceed with MRI of the breast followed by surgery.  Adjuvant treatment will be considered depending on pathology findings.   Patient ran into some insurance issues with her MRI scheduling.  We are currently in the process of arranging this at [facility] as per her insurance, tentatively scheduled on 11/01/2023.  Dr. Delane Fear is aware of this and will plan  surgery depending on MRI findings.  I will plan to see her in the postoperative setting.  Resistant hypertension Patient has a history of hypertension and was previously  misdiagnosed with congestive heart failure. Currently on Amlodipine  once daily, Carvedilol  twice daily, irbesartan daily, spironolactone daily, and Furosemide  once daily. -Continue current medications. -Emphasize the importance of good blood pressure control, especially in the context of chemotherapy.   I reviewed lab results and outside records for this visit and discussed relevant results with the patient. Diagnosis, plan of care and treatment options were also discussed in detail with the patient. Opportunity provided to ask questions and answers provided to her apparent satisfaction. Provided instructions to call our clinic with any problems, questions or concerns prior to return visit. I recommended to continue follow-up with PCP and sub-specialists. She verbalized understanding and agreed with the plan.   NCCN guidelines have been consulted in the planning of this patient's care.  I spent a total of 30 minutes during this encounter with the patient including review of chart and various tests results, discussions about plan of care and coordination of care plan.  Arlo Berber, MD  10/17/2023 3:32 PM  [facility] - A DEPT OF 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  Current Treatment: Plan made initially for neoadjuvant chemotherapy using KEYNOTE-522 protocol (carboplatin  and paclitaxel  plus Keytruda  for 4 cycles followed by Adriamycin , Cytoxan  and Keytruda  for 4 cycles followed by 9 more doses of adjuvant Keytruda ).  Started cycle 1 of chemotherapy on 07/04/2023.  She received Keytruda  first.  Later she started Taxol  and few minutes into infusion, she had severe infusion reaction  with chest pain, hypotension, diaphoresis, hypoxia.  EKG was unremarkable.  Blood pressure and O2 sats normalized after additional allergy medication and supplemental oxygen.  We will not rechallenge her with Taxol .  Plan made to switch treatments to Taxotere  and cyclophosphamide  regimen, to be given every 3 weeks for up to 6 cycles.  He had moderate reaction to Taxotere  as well.  Hence plan made to switch treatments to AC regimen in a dose dense fashion for up to 4 cycles, starting from 07/27/2023.  This was to be followed by Abraxane  weekly.  INTERVAL HISTORY:   Discussed the use of AI scribe software for clinical note transcription with the patient, who gave verbal consent to proceed.  History of Present Illness  Jasmine Novak is a 53 year old female undergoing chemotherapy who presents for follow-up and management of treatment side effects.  She is currently experiencing nausea following her last chemotherapy treatment. 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 No fever, chills, or night sweats. Blood counts show a normal white count and platelets, with a hemoglobin level of 9.7, which has decreased slightly, a common occurrence with continued chemotherapy. All other chemistries, including kidney and liver function tests, are normal.  She has previously had a reaction to a different chemotherapy medication and a nausea medication, necessitating close monitoring during her current treatment.  She does not feel any lumps in the areas previously affected by cancer and is hopeful that the disease has not spread. 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  She is applying for food stamps, indicating a possible reduction in work hours or income.   Jasmine Novak is a 53 year old female with breast  cancer who presents for follow-up regarding MRI scheduling and treatment planning.  She is currently undergoing treatment for breast cancer and is awaiting an MRI at [facility] due to insurance requirements. The MRI is necessary before proceeding with further treatment and surgery. There have been logistical issues with scheduling the MRI.  She has a history of previous reactions to certain medications, which has caused concern about new treatments. However, she has had good outcomes with similar situations in the past, and discussions with breast cancer experts suggest that cross-reaction with a new medication is unlikely.  She feels well overall, with no coughing and an improving appetite. She was able to enjoy a meal with her son on Mother's Day, indicating improvement in her taste and appetite. She is currently not experiencing any work-related issues and is keeping an eye on her phone for updates regarding the MRI scheduling.   I have reviewed the past medical history, past surgical history, social history and family history with the patient and they are unchanged from previous note.  HISTORY OF PRESENT ILLNESS:   ONCOLOGY HISTORY:  The patient has not been getting regular mammograms previously and has not seen a primary doctor recently.    She reports lump in her right breast that she noticed in mid-October 2024. The lump was causing discomfort, particularly when she is moving or wearing a bra. The patient reports that the area around the lump is tender and warm to the touch. She has been taking ibuprofen  to manage the discomfort. She is right-handed and has been trying to avoid using her right arm due to the discomfort from the lump.    Since pain and swelling persisted, she presented to the ED on 05/07/2023.  A CT chest with contrast was obtained on 05/07/2023 which showed 5 x 4.2 cm mass within the right breast with diffuse cutaneous thickening overlying the right breast, highly  suspicious for primary breast malignancy.  Right axillary lymphadenopathy measuring up to 13 mm, suspicious for metastatic disease.  A 3 mm subpleural right upper lobe lung nodule, indeterminate. Coronary artery calcifications and left atrial enlargement was also noted.   ED provider reached out to our clinic for further assistance and management.  We arranged for diagnostic mammogram, ultrasound and biopsy.   On 05/22/2023, diagnostic mammogram showed 5.3 cm highly suspicious mass in the right breast associated with adjacent inflammation and overlying skin thickening consistent with inflammatory breast carcinoma.  3-4 abnormal right axillary lymph nodes, suspicious for metastatic disease.  2 adjacent benign clusters of cysts in the left breast.  No evidence of left breast malignancy.  Ultrasound showed similar findings.   On 05/23/2023 she underwent core needle biopsy of right breast mass.  Pathology showed invasive ductal carcinoma, mitotic score 3, grade 3.  ER weakly positive at 10%.  PR negative, HER2/neu was 2+ by IHC, negative by FISH.  High Ki-67 index of 80%.   With these findings, she presented to clinic to establish care with us  on 06/06/2023.  Request submitted for PET/CT and echocardiogram.   On 06/13/2023, staging PET/CT showed intense metabolic activity within the right breast mass consistent with primary breast carcinoma.  Hypermetabolic right axillary and subpectoralis  lymph nodes consistent with local nodal metastasis.  No evidence of distant metastatic disease.    Given no evidence of metastatic disease, plan is to treat her with neoadjuvant chemoimmunotherapy using KEYNOTE-522 protocol (carboplatin  and paclitaxel  plus Keytruda  for 4 cycles followed by Adriamycin , Cytoxan  and Keytruda  for 4 cycles followed by 9 more doses of adjuvant Keytruda ).  Started systemic treatments from 07/04/2023.  Started cycle 1 of chemotherapy on 07/04/2023.  She received Keytruda  first.  Later she started  Taxol  and few minutes into infusion, she had severe infusion reaction with chest pain, hypotension, diaphoresis, hypoxia.  EKG was unremarkable.  Blood pressure and O2 sats normalized after additional allergy medication and supplemental oxygen.   We will not rechallenge her with Taxol .   Plan made to switch treatments to Taxotere  and cyclophosphamide  regimen, to be given every 3 weeks for up to 6 cycles.  On 07/13/2023, she had infusion reaction to Taxotere  as well, although not as severe as she had with Taxol .  She mainly had persistent lower back pain, increased blood pressure and some chest discomfort, which resolved quickly with pausing the infusion and Tylenol .   Plan made to switch treatments to dose dense AC, followed by weekly Abraxane .  Started this from 07/27/2023.  She completed 4 cycles of AC.  She had excellent clinical response.  On 10/05/2023, when she presented to start weekly Abraxane , patient got very anxious in the treatment room and had panic attacks because of previous experiences with Taxol  and Taxotere .  After thorough discussion, patient opted not to proceed with Abraxane .  Plan is to proceed with MRI of the breast followed by surgery.  Adjuvant treatment will be considered depending on pathology findings.   Oncology History  Inflammatory carcinoma of breast, right (HCC)  06/08/2023 Initial Diagnosis   Inflammatory carcinoma of breast, right (HCC)   06/15/2023 Cancer Staging   Staging form: Breast, AJCC 8th Edition - Clinical: Stage IIIC (cT4b, cN2, cM0, G3, ER+, PR-, HER2-) - Signed by Arlo Berber, MD on 06/15/2023 Histologic grading system: 3 grade system   07/04/2023 - 07/04/2023 Chemotherapy   Patient is on Treatment Plan : BREAST Pembrolizumab  (200) D1 + Carboplatin  (5) D1 + Paclitaxel  (80) D1,8,15 q21d X 4 cycles / Pembrolizumab  (200) D1 + AC D1 q21d x 4 cycles     07/13/2023 - 07/14/2023 Chemotherapy   Patient is on Treatment Plan : BREAST TC q21d     07/27/2023 -   Chemotherapy   Patient is on Treatment Plan : BREAST DOSE DENSE AC q14d / Nab-Paclitaxel  (Abraxane ) q7d         REVIEW OF SYSTEMS:   Review of Systems - Oncology  All other pertinent systems were reviewed with the patient and are negative.  ALLERGIES: She is allergic to paclitaxel , docetaxel , lisinopril , and fosaprepitant .  MEDICATIONS:  Current Outpatient Medications  Medication Sig Dispense Refill   amLODipine  (NORVASC ) 10 MG tablet Take 1 tablet (10 mg total) by mouth daily. 90 tablet 0   carvedilol  (COREG ) 12.5 MG tablet Take 12.5 mg by mouth.     furosemide  (LASIX ) 40 MG tablet TAKE 1 TABLET(40 MG) BY MOUTH DAILY 30 tablet 2   irbesartan (AVAPRO) 300 MG tablet Take by mouth.     spironolactone (ALDACTONE) 25 MG tablet Take by mouth.     azithromycin  (ZITHROMAX ) 250 MG tablet Take 2 tablets by mouth once on day 1, then 1 tablet daily until complete (Patient not taking: Reported on 10/17/2023) 6 each 0   guaiFENesin -codeine  100-10  MG/5ML syrup Take 5 mLs by mouth every 4 (four) hours as needed for cough. (Patient not taking: Reported on 10/17/2023) 500 mL 0   guaiFENesin -Codeine  200-10 MG/5ML LIQD Take 5 mLs by mouth 3 (three) times daily as needed (Cough). (Patient not taking: Reported on 09/19/2023) 500 mL 0   ibuprofen  (ADVIL ) 200 MG tablet Take 200 mg by mouth every 6 (six) hours as needed. (Patient not taking: Reported on 10/17/2023)     lidocaine -diphenhydrAMINE -alum & mag hydroxide-simeth Take 5 mLs by mouth 3 (three) times daily as needed for mouth pain. (Patient not taking: Reported on 09/19/2023) 150 mL 2   loperamide  (IMODIUM ) 2 MG capsule Take 1-2 capsules (2-4 mg total) by mouth as needed for diarrhea or loose stools. (Patient not taking: Reported on 10/17/2023) 30 capsule 1   loratadine  (CLARITIN  REDITABS) 10 MG dissolvable tablet Take 1 tablet (10 mg total) by mouth daily. (Patient not taking: Reported on 10/17/2023) 30 tablet 1   magic mouthwash (nystatin , hydrocortisone,  diphenhydrAMINE , lidocaine ) suspension Swish and swallow 5 mLs 3 (three) times daily as needed for mouth pain. (Patient not taking: Reported on 08/10/2023) 500 mL 1   magic mouthwash (lidocaine , diphenhydrAMINE , alum & mag hydroxide) suspension Swish and swallow 5 mLs by mouth 3 (three) times daily as needed for mouth pain. (Patient not taking: Reported on 08/29/2023) 150 mL 1   ondansetron  (ZOFRAN -ODT) 8 MG disintegrating tablet Take 1 tablet (8 mg total) by mouth every 8 (eight) hours as needed for nausea or vomiting. (Patient not taking: Reported on 10/17/2023) 30 tablet 1   prochlorperazine  (COMPAZINE ) 10 MG tablet Take 1 tablet (10 mg total) by mouth every 6 (six) hours as needed for nausea or vomiting. (Patient not taking: Reported on 10/17/2023) 30 tablet 0   traMADol  (ULTRAM ) 50 MG tablet Take 1 tablet (50 mg total) by mouth every 6 (six) hours as needed. (Patient not taking: Reported on 10/17/2023) 90 tablet 0   No current facility-administered medications for this visit.     VITALS:   Blood pressure (!) 187/88, pulse 82, temperature 98.2 F (36.8 C), temperature source Temporal, resp. rate 16, weight 295 lb 1.6 oz (133.9 kg), last menstrual period 01/17/2022, SpO2 100%.  Wt Readings from Last 3 Encounters:  10/17/23 295 lb 1.6 oz (133.9 kg)  10/03/23 293 lb 8 oz (133.1 kg)  09/21/23 299 lb 9.6 oz (135.9 kg)    Body mass index is 47.63 kg/m.   Onc Performance Status - 10/17/23 1106       ECOG Perf Status   ECOG Perf Status Restricted in physically strenuous activity but ambulatory and able to carry out work of a light or sedentary nature, e.g., light house work, office work      KPS SCALE   KPS % SCORE Able to carry on normal activity, minor s/s of disease                 PHYSICAL EXAM:   Physical Exam Constitutional:      General: She is not in acute distress.    Appearance: Normal appearance.  HENT:     Head: Normocephalic and atraumatic.     Mouth/Throat:      Comments: Scattered dark pigmentation on the tongue.  No definite signs of infection.  No thrush. Eyes:     General: No scleral icterus.    Conjunctiva/sclera: Conjunctivae normal.  Cardiovascular:     Rate and Rhythm: Normal rate and regular rhythm.     Heart sounds: Normal  heart sounds.  Pulmonary:     Effort: Pulmonary effort is normal.     Breath sounds: Normal breath sounds.  Chest:     Comments: Breast exam deferred today.  Last exam by me on 10/03/2023 is as follows:   Previously noted erythema of the skin on the right breast has resolved.  No definite palpable lump at this time.  Scattered nodularity in the right breast, much improved compared to prior.  Peau d'orange skin changes have also resolved.  Overall excellent clinical response. Abdominal:     General: There is no distension.  Musculoskeletal:     Right lower leg: Edema present.     Left lower leg: Edema present.  Neurological:     General: No focal deficit present.     Mental Status: She is alert and oriented to person, place, and time.  Psychiatric:        Mood and Affect: Mood normal.        Behavior: Behavior normal.        Thought Content: Thought content normal.      LABORATORY DATA:   I have reviewed the data as listed.  Results for orders placed or performed in visit on 10/17/23  CMP (Cancer Center only)  Result Value Ref Range   Sodium 137 135 - 145 mmol/L   Potassium 4.1 3.5 - 5.1 mmol/L   Chloride 103 98 - 111 mmol/L   CO2 25 22 - 32 mmol/L   Glucose, Bld 91 70 - 99 mg/dL   BUN 9 6 - 20 mg/dL   Creatinine 1.61 0.96 - 1.00 mg/dL   Calcium 9.2 8.9 - 04.5 mg/dL   Total Protein 7.1 6.5 - 8.1 g/dL   Albumin 3.8 3.5 - 5.0 g/dL   AST 19 15 - 41 U/L   ALT 18 0 - 44 U/L   Alkaline Phosphatase 93 38 - 126 U/L   Total Bilirubin 0.4 0.0 - 1.2 mg/dL   GFR, Estimated >40 >98 mL/min   Anion gap 9 5 - 15  CBC with Differential (Cancer Center Only)  Result Value Ref Range   WBC Count 5.7 4.0 - 10.5 K/uL    RBC 3.77 (L) 3.87 - 5.11 MIL/uL   Hemoglobin 10.4 (L) 12.0 - 15.0 g/dL   HCT 11.9 (L) 14.7 - 82.9 %   MCV 86.7 80.0 - 100.0 fL   MCH 27.6 26.0 - 34.0 pg   MCHC 31.8 30.0 - 36.0 g/dL   RDW 56.2 (H) 13.0 - 86.5 %   Platelet Count 284 150 - 400 K/uL   nRBC 0.0 0.0 - 0.2 %   Neutrophils Relative % 67 %   Neutro Abs 3.8 1.7 - 7.7 K/uL   Lymphocytes Relative 13 %   Lymphs Abs 0.7 0.7 - 4.0 K/uL   Monocytes Relative 18 %   Monocytes Absolute 1.0 0.1 - 1.0 K/uL   Eosinophils Relative 1 %   Eosinophils Absolute 0.1 0.0 - 0.5 K/uL   Basophils Relative 1 %   Basophils Absolute 0.1 0.0 - 0.1 K/uL   Immature Granulocytes 0 %   Abs Immature Granulocytes 0.02 0.00 - 0.07 K/uL       RADIOGRAPHIC STUDIES:  No recent pertinent imaging available to review.  CODE STATUS:  Code Status History     Date Active Date Inactive Code Status Order ID Comments User Context   11/13/2015 0949 11/15/2015 1650 Full Code [id number]  Doroteo Gasmen, MD Inpatient  Orders Placed This Encounter  Procedures   CBC with Differential (Cancer Center Only)    Standing Status:   Future    Expiration Date:   10/16/2024   CMP (Cancer Center only)    Standing Status:   Future    Expiration Date:   10/16/2024   Iron and TIBC    Standing Status:   Future    Expiration Date:   10/16/2024   Ferritin    Standing Status:   Future    Expiration Date:   10/16/2024      This document was completed utilizing speech recognition software. Grammatical errors, random word insertions, pronoun errors, and incomplete sentences are an occasional consequence of this system due to software limitations, ambient noise, and hardware issues. Any formal questions or concerns about the content, text or information contained within the body of this dictation should be directly addressed to the provider for clarification.

## 2023-10-17 NOTE — Progress Notes (Signed)
 Patient has MRI appt scheduled at [facility] in [place], [facility], on Wednesday, May 28th at 4:20pm as confirmed by the [facility] Scheduling Dept with myself and the patient via telephone call. Patient is aware and has all of these details and agrees to attend.

## 2023-10-18 ENCOUNTER — Encounter: Payer: Self-pay | Admitting: Oncology

## 2023-10-19 ENCOUNTER — Inpatient Hospital Stay

## 2023-10-19 ENCOUNTER — Other Ambulatory Visit

## 2023-10-19 ENCOUNTER — Ambulatory Visit: Admitting: Oncology

## 2023-10-19 ENCOUNTER — Ambulatory Visit: Admitting: Nurse Practitioner

## 2023-10-31 ENCOUNTER — Telehealth: Payer: Self-pay

## 2023-10-31 NOTE — Telephone Encounter (Signed)
 Spoke directly with patient, just now, to remind her of her MRI appt tomorrow. MRI is scheduled at [facility] in [place], at [facility], tomorrow, Wednesday, May 28th at 4:20pm as confirmed by the [facility] Scheduling Dept with myself and the patient via telephone call. Patient is aware and has all of these details and continues to agree to attend this appt.

## 2023-11-07 ENCOUNTER — Telehealth: Payer: Self-pay

## 2023-11-07 ENCOUNTER — Encounter: Payer: Self-pay | Admitting: *Deleted

## 2023-11-07 NOTE — Progress Notes (Signed)
 PATIENT NAVIGATOR PROGRESS NOTE  Name: Jasmine Novak Date: 11/07/2023 MRN: [id number]  DOB: [birth year]   Reason for visit:  Coordination of care  Comments:  Called and spoke with Jennifer Mcclain, Breast cancer navigator at [facility].  She has [name] scheduled for surgical consult with Dr Arline Laity on November 15, 2023 at 1:30pm  Patient needs to arrive at [facility] at [facility] at 1 pm   Called [name] and left voicemail for her to call so that we can review appt information    Time spent counseling/coordinating care: > 60 minutes

## 2023-11-07 NOTE — Telephone Encounter (Signed)
 Left a voicemail entailing the following on patient's cell phone:    Patient's next appt is at [facility] on June 11th at 1pm on 3rd floor with Dr. Arline Laity. Park on deck C. Call Bridgette [facility], nurse navigator with [facility], at [phone].

## 2023-11-08 ENCOUNTER — Encounter: Payer: Self-pay | Admitting: Nurse Practitioner

## 2023-11-15 NOTE — Progress Notes (Addendum)
 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 female from [place], [place] seen at the request of Tully Shawnee Gladis Sharl, PA regarding a new diagnosis of right breast cancer.   Oncology History  Inflammatory carcinoma of breast, right (HCC)  06/08/2023 Initial Diagnosis  Inflammatory carcinoma of breast, right (HCC)  06/15/2023 Cancer Staging  Staging form: Breast, AJCC 8th Edition - Clinical: Stage IIIC (cT4b, cN2, cM0, G3, ER+, PR-, HER2-) - Signed by Autumn Millman, MD on 06/15/2023 Histologic grading system: 3 grade system  07/04/2023 - 07/04/2023 Chemotherapy  Patient is on Treatment Plan : BREAST Pembrolizumab  (200) D1 + Carboplatin  (5) D1 + Paclitaxel  (80) D1,8,15 q21d X 4 cycles / Pembrolizumab  (200) D1 + AC D1 q21d x 4 cycles   07/13/2023 - 07/14/2023 Chemotherapy  Patient is on Treatment Plan : BREAST TC q21d   07/27/2023 - Chemotherapy  Patient is on Treatment Plan : BREAST DOSE DENSE AC q14d / Nab-Paclitaxel  (Abraxane ) q7d   She presents today to discuss surgical treatment.   PSH: Surgical History[1]  PMH: Medical History[2]   MEDS: I am having Jasmine Novak maintain her Flintstones with Iron, ibuprofen , clobetasoL, amLODIPine , furosemide , irbesartan, spironolactone, and carvediloL . We will continue to administer sodium chlor-hypochlorous acid, lidocaine , sodium chlor-hypochlorous acid, and lidocaine .  ALLERGIES:  is allergic to paclitaxel , docetaxel , lisinopril , and fosaprepitant .    reports that she has never smoked. She has never used smokeless tobacco. She reports that she does not drink alcohol and does not use drugs.   FAMILY HISTORY: family history includes Breast cancer in her maternal aunt; Diabetes in her maternal grandmother and  mother; Heart failure in her maternal grandfather; Hypertension in her mother.  reports no history of alcohol use.  reports no history of drug use.  reports that she has never smoked. She has never used smokeless tobacco.  PHYSICAL EXAMINATION: BP (!) 207/107   Pulse 105   Temp 96.4 F (35.8 C) (Temporal)   Ht 1.676 m (5' 6)   Wt 133 kg (292 lb 9.6 oz)   SpO2 98%   BMI 47.23 kg/m   Wt Readings from Last 3 Encounters:  11/15/23 133 kg (292 lb 9.6 oz)  09/29/22 (!) 148 kg (327 lb)  06/30/22 (!) 145 kg (320 lb)   HEENT: Atraumatic, normocephalic, extraocular muscles are intact NECK: Supple, negative for masses, left powerport HEART: Normal rate, appears well perfused LUNGS:   Symmetric expansion of the chest, non labored breathing LYMPHATIC: No palpable bilateral axillary, cervical or supraclavicular lymph nodes. ABDOMEN: Soft, nontender, no hepatosplenomegaly. Left inguinal nodes not palpable EXTREMITIES: Without cyanosis, clubbing or edema. NEUROLOGIC: Alert and oriented x 3. Motor is normal and gait is normal. Normal mood and normal affect CHEST: Breasts are symmetric, F cup; Right: right axillary 3 cm palpable lymph node; no skin changes or dimpling, nipple discharge or retraction.  Left: No focal masses, skin changes or dimpling, nipple discharge or retraction.   No open wounds or areas of concern for infection; no gross erythema, edema, bleeding, and/or drainage present.  ======================== LABS: Review Flowsheet       Latest Ref Rng & Units 06/10/2021 11/12/2020 08/14/2020 10/29/2019  Common Labs  Hemoglobin 12.3 - 15.3 G/DL 87.9*  - - -  Hematocrit 35.9 - 44.6 %  37.3  - - -  Platelet Count (Plt) 150 - 450 X 10*3/uL 304  - - -  WBC 4.4 - 11.0 x 10*3/uL 7.0  - - -  Sodium 135 - 146 MMOL/L 137  138  137  136   Potassium 3.5 - 5.3 MMOL/L 4.5  4.5  4.1  4.1   Glucose 70 - 99 MG/DL 99  68*  86  79   Blood Urea Nitrogen 8 - 24 MG/DL 15  14  12  10    Creatinine 0.50 -  1.50 MG/DL 9.04  9.06  9.05  9.19   Calcium Level Total 8.5 - 10.5 MG/DL 8.9  8.8  9.2  8.8   Albumin 3.5 - 5.0 G/DL 3.6  - - 3.9   Aspartate Aminotransferase (AST) 5 - 40 IU/L or U/L 13  - - 14   Alanine Aminotransferase 5 - 50 IU/L or U/L 16  - - 16   Bilirubin Total 0.1 - 1.2 MG/DL 0.4  - - 0.4   Total Protein 6.0 - 8.3 G/DL 7.9  - - 7.8      IMAGING:  MRI BOTH BREASTS WITH AND WITHOUT CONTRAST, 11/01/2023 5:42 PM  INDICATION: Breast cancer, invasive, stage I/II/III assess treatment response, Please power share images, Malignant neoplasm of unspecified site of right female breast (CMD) \ C50.911 Malignant neoplasm of unspecified site of right female breast (CMD) Breast cancer, invasive, stage I/II/III assess treatment response,  COMPARISON: Diagnostic mammogram 05/22/2023, PET/CT 06/13/2023 and priors.  TECHNIQUE: Multiplanar, multisequence surface coil MR imaging of both breasts was performed before and after the administration of intravenous gadolinium-based contrast.  FINDINGS:  The breast tissue composition is heterogeneous fibroglandular tissue with marked background parenchymal enhancement. The degree of background parenchymal enhancement decreases the sensitivity for smaller masses.  Right breast:  Biopsy-proven malignant mass in the subareolar aspect, anterior depth shows low-level enhancement above background and measures 4.6 x 2.6 x 2.1 cm (series 14, image 104 and series 4000, image 93). Double decrease in the skin thickening compared to prior mammogram without enhancement today. 17 x 16 mm irregular heterogeneously enhancing mass extending into the adjacent skin in the lower inner quadrant, anterior depth (series 14 ,image 24). Multiple enhancing foci and masses are noted along the lateral aspect extending from anterior to mid depth, with the largest enhancing irregular mass in upper outer quadrant, anterior depth measuring 8 x 5 x 19 mm (series 67795, image 45 and 400000, image  86).  Left breast: No suspicious mass or abnormal enhancement above background identified .  Lymph nodes:  Interval decreased right axillary and subpectoral adenopathy from PET/CT dated 06/13/2023, at least 2 morphologically abnormal level 1 lymph nodes are noted, index node with eccentric cortical thickening measures 15 x 8 mm (series 2, image 50).  No enlarged left axillary or bilateral internal mammary adenopathy.  Additional comments: Chemo-Port related susceptibility artifact in the superior aspect of the left chest wall.  IMPRESSION: 1. Interval response to neoadjuvant chemotherapy, right breast with persistent mass and skin thickening detailed above. 2. Multiple enhancing foci and masses in the lateral aspect of the right breast is concerning for multicentric malignancy. 3. Irregular subdermal mass with adjacent skin thickening in the lower inner quadrant right breast is concerning for an additional site of neoplasm. 4. No MRI evidence of malignancy in the left breast.  BI-RADS Category 6 - Known biopsy-proven malignancy. Appropriate action should be taken.  RECOMMENDATIONS: Patient is under the care of [name], MD from  oncology. If it would change clinical management, additional MRI guided biopsy of the largest mass in the outer aspect of the right breast can be done.   CLINICAL DATA:  Initial treatment strategy for RIGHT breast  carcinoma.   EXAM:  NUCLEAR MEDICINE PET SKULL BASE TO THIGH   TECHNIQUE:  15.9 mCi F-18 FDG was injected intravenously. Full-ring PET imaging  was performed from the skull base to thigh after the radiotracer. CT  data was obtained and used for attenuation correction and anatomic  localization.   Fasting blood glucose: 78 mg/dl   COMPARISON:  None Available.   FINDINGS:  NECK: No hypermetabolic lymph nodes in the neck.   CHEST:   Intense metabolic activity within a 6.8 cm RIGHT breast mass with  SUV max equal 35.8.   Several  hypermetabolic RIGHT axial lymph nodes. For example 17 mm  node on image 61 with SUV max equal 5.3. There are 2 axillary  hypermetabolic nodes in 1 sub pectoralis node. Hypermetabolic sub  pectoralis node on image 67.   No central thoracic hypermetabolic lymph nodes. No suspicious  pulmonary nodules   Incidental CT findings: None.   ABDOMEN/PELVIS: No abnormal hypermetabolic activity within the  liver, pancreas, adrenal glands, or spleen. No hypermetabolic lymph  nodes in the abdomen or pelvis.   Incidental CT findings: Nonobstructing RIGHT renal calculus measures  10 mm. Uterus and adnexa unremarkable.   SKELETON: No focal hypermetabolic activity to suggest skeletal  metastasis.   Incidental CT findings: None.  IMPRESSION:  1. Intense metabolic activity within the RIGHT breast mass  consistent with primary breast carcinoma.  2. Hypermetabolic RIGHT axillary and sub pectoralis lymph nodes  consistent with local nodal metastasis.  3. No evidence of distant metastatic disease.  4. Nonobstructing RIGHT renal calculus.   Electronically Signed:  By: Jackquline Boxer M.D.  On: 06/14/2023 10:12     ============ PATHOLOGY: ADDENDUM:  Pathology revealed GRADE III INVASIVE DUCTAL CARCINOMA,  LYMPHOVASCULAR INVASION: NOT IDENTIFIED, OTHER FINDINGS: NONE, of  the RIGHT breast, 9 o'clock 9 cmfn, (coil clip). This was found to  be concordant by Dr. Norman Hopper.   Pathology revealed ONE BENIGN LYMPH NODE, NEGATIVE FOR CARCINOMA of  the RIGHT axilla, (open coil clip). This was found to be concordant  by Dr. Norman Hopper.   Pathology results were discussed with the patient by telephone. The  patient reported doing well after the biopsies with tenderness at  the sites. Post biopsy instructions and care were reviewed and  questions were answered. The patient was encouraged to call [facility] for any additional concerns. My  direct phone number was  provided.   Medical oncology consultation has been arranged with Dr. [name] at [facility] on June 06, 2023.   Surgical consultation has been arranged with Dr. Donnice Bury  at [facility] on June 16, 2023.  Pathology results reported by Hendricks Benders, RN on 05/25/2023.   ASSESSMENT: Jasmine Novak presents to the Breast Care Clinic for an assessment of right inflammatory breast cancer with lymph node metastasis.  We discussed the findings from imaging and pathology including estrogen, progesterone receptor and HER2 receptor status and impact in determining the use of adjuvant hormonal treatment. ER 10%, PR negative, Her2 negative.  Surgical options discussed including wide local excision (lumpectomy) and mastectomy. Equivalent survival with breast conserving surgery with radiation and mastectomy discussed. However, I suggested with an initial  presentation of inflammatory breast cancer, I suggested mastectomy with node dissection. We discussed plastic surgery options for reconstruction following mastectomy including breast implants and autologous breast reconstruction. Due to the nature of this breast cancer, she would need right mastectomy.  We discussed the role of sentinel lymph node biopsy and technique to assess lymph node involvement and invasive breast cancer.  Reviewed the role of axillary node dissection if the sentinel lymph node is positive, or in the case of sentinel lymph node mapping failure.  Discussed the complications of sentinel lymph node biopsy and axillary lymph node dissection including the potential for lymphedema. She will require lymph node assessment. In light of the extensive nodal disease suggested axillary lymphadenectomy.  Additionally, we discussed the role of adjuvant therapies including radiation therapy, chemotherapy and endocrine therapy. We informed the patient that further discussion regarding the  role of chemotherapy and adjuvant hormone therapy would take place with the medical oncologist.   PLAN: Jasmine Novak wishes to return in a week to discuss further. She will plan on bringing a list of questions. In the meantime, she will be in touch with our nurse navigator and we will be available to answer any questions or concerns via phone or [facility].  --RTC in 3 week to discuss surgical plan --Patient will call with questions or concerns --Referral for genetic counseling, Thuy Vu consult today --PET this week followed by IR US  guided biopsy, left inguinal mass  This visit was a shared visit with Dr. Claryce,  who agrees with the plan. Electronically signed by: Channing Hendricks Dike, PA-C 11/15/2023 2:21 PM  Attestation (Dr. Claryce):  I personally confirmed the history, performed the examination and reviewed the data for  and imaging for Jasmine Novak myself on the day of the clinic visit.  I saw the patient with Channing Dike, PA (Physician Assistant) as a shared visit .  I concur with the findings above.  We discussed the implications of the diagnosis and options for surveillance and/or therapy.  Medical decision making in its entirety as documented by Channing Dike, PA was personally performed by myself. I have reviewed this documentation  and agree with it as stated. I have suggested a follow up CT-PET scan to further evaluate the disease as well as a biopsy of the left inguinal lymphadenopathy hypermetabolic on initial PET imaging.  Edward A. Claryce, M.D.  Professor of Surgery  Chief, Surgical Oncology Service  11/15/2023 5:28 PM  PET IMAGING: PET/CT Skull Base to Mid Thigh Narrative: PET/CT SKULL BASE TO MID THIGH, 11/24/2023 4:18 PM  INDICATION: Inflammatory breast cancer, right, Inflammatory breast cancer, right, Malignant neoplasm of unspecified site of right female breast    (CMD) \ C50.911 Malignant neoplasm of unspecified site of right female breast    (CMD)  Inflammatory breast  cancer, right ADDITIONAL HISTORY: As above COMPARISON: FDG PET/CT dated 06/13/2023.  TECHNIQUE: 51 minutes after the intravenous injection of 10.27 mCi F-18 FDG, PET imaging was obtained from the skull base through the mid thighs. These images were then attenuation corrected using low-dose CT and co-registered for anatomic localization. Standardized uptake values (SUV) were calculated using a lean body mass algorithm. Blood glucose at the time of injection was 107 mg/dL.  All CT scans at [facility] and [facility] are performed using radiation dose optimization techniques as appropriate to a performed exam, including but not limited to one or more of the following: automatic exposure control, adjustment of the mA and/or  kV according to patient size, use of iterative reconstruction technique. In addition, our institution participates in a radiation dose monitoring program to optimize patient radiation exposure.  SUVmax of blood pool: 1.3 SUVmax of liver: 1.2  LIMITATIONS: None.  FINDINGS:   DISEASE-RELATED FINDINGS: Mild uptake within a 2.6 x 1.6 cm  nodular right breast soft tissue at the region of the biopsy clip SUVmax 1.3 (image 115), previously a 6.4 x 5.5 cm mass with SUVmax 10. Resolution of previously seen right breast FDG avid cutaneous thickening. Resolution of previously seen retropectoral and axillary right FDG avid lymphadenopathy for example a right axillary 0.8 x 0.6 cm node with SUVmax 0.5 (image 94) previously measured 1.6 x 1.5 cm with SUVmax 1.5. No new suspicious FDG avid lymphadenopathy.   OTHER TRACER-RELATED FINDINGS: Expected physiologic activity within the kidneys, ureter, bladder, oropharynx, salivary glands, stomach, bowel, and brain. Left tonsillar uptake with SUVmax 2.4 (image 36) Left submandibular mildly FDG avid, which may be inflammatory (image 44) FDG avid left inguinal 3.2 x 2.4 cm node with SUVmax 1.2 (image 86)  previously measuring 3.9 x 3.1 cm, SUVmax 1.4.  ANCILLARY CT FINDINGS: Thickened left more than right adrenal gland. Ethmoid air cell opacification. Air-fluid levels in the maxillary sinuses. Left IJ port with the tip terminating in the right atrium. Mosaic attenuation of the lungs which may be seen with air trapping. Left lower lobe sheetlike atelectasis. Right lower renal 0.6 cm calyceal calculus. Impression: CONCLUSION: 1.  Decrease in size and intensity of the right breast mass as well as resolution of previously seen FDG avid cutaneous thickening and right axillary as well as subpectoral lymphadenopathy. 2.  Left tonsillar uptake for which correlation with direct inspection is suggested. 3.  FDG avid enlarged left inguinal node, decreased in size since the previous exam, for which correlation with left lower extremity exam is suggested to evaluate for etiology.        [1] Past Surgical History: Procedure Laterality Date  . CARPAL TUNNEL RELEASE     Procedure: CARPAL TUNNEL RELEASE  [2] Past Medical History: Diagnosis Date  . Eczema   . Heart failure    (CMD)   . Hypertension   *Some images could not be shown.

## 2023-11-27 NOTE — Progress Notes (Signed)
 FYI, will schedule biopsy with IR for inguinal node  Channing

## 2023-12-06 ENCOUNTER — Other Ambulatory Visit: Payer: Self-pay

## 2024-01-04 ENCOUNTER — Other Ambulatory Visit: Payer: Self-pay

## 2024-01-24 ENCOUNTER — Emergency Department (HOSPITAL_BASED_OUTPATIENT_CLINIC_OR_DEPARTMENT_OTHER)
Admission: EM | Admit: 2024-01-24 | Discharge: 2024-01-24 | Disposition: A | Discharge status: Home / Self Care | Attending: Emergency Medicine | Admitting: Emergency Medicine

## 2024-01-24 ENCOUNTER — Other Ambulatory Visit: Payer: Self-pay

## 2024-01-24 ENCOUNTER — Encounter (HOSPITAL_BASED_OUTPATIENT_CLINIC_OR_DEPARTMENT_OTHER): Payer: Self-pay

## 2024-01-24 DIAGNOSIS — Z4800 Encounter for change or removal of nonsurgical wound dressing: Secondary | ICD-10-CM | POA: Diagnosis present

## 2024-01-24 DIAGNOSIS — Z5189 Encounter for other specified aftercare: Secondary | ICD-10-CM

## 2024-01-24 NOTE — ED Triage Notes (Signed)
 Pt has a mastectomy on 8/14 at [facility], she currently has 2 JP drains in place, she was told to come in if she had any leakage around the site and she woke up with some leakage around the site tonight. No redness and no fevers at home. Pt is otherwise stable at this time.

## 2024-01-24 NOTE — ED Notes (Signed)
 Right Beast/ Mastectomy site exam by provider; surrounding skin asymptomatic; dressing intact/minimal drainage to dressings; JP tubing sutures intact; serosanguinous drainage noted in x 2 JP collection containers; patient denies pain/ fever/ chills

## 2024-01-24 NOTE — ED Provider Notes (Signed)
 [place] EMERGENCY DEPARTMENT AT [facility] Provider Note   CSN: [id number] Arrival date & time: 01/24/24  [id number]     Patient presents with: Post-op Problem   Jasmine Novak is a 53 y.o. female.   Patient is a 53 year old female who is 6 days postop from mastectomy performed at [facility].  She has 2 JP drains under her right axilla.  Tonight she woke up and the dressing was wet.  She is uncertain as to where the drainage come from.  She denies any increasing pain, fevers, or other concerns.       Prior to Admission medications   Medication Sig Start Date End Date Taking? Authorizing Provider  amLODipine  (NORVASC ) 10 MG tablet Take 1 tablet (10 mg total) by mouth daily. 08/22/18   Vicenta Maduro, FNP  azithromycin  (ZITHROMAX ) 250 MG tablet Take 2 tablets by mouth once on day 1, then 1 tablet daily until complete Patient not taking: Reported on 10/17/2023 09/12/23   Pasam, Chinita, MD  carvedilol  (COREG ) 12.5 MG tablet Take 12.5 mg by mouth. 06/30/22   [provider]  furosemide  (LASIX ) 40 MG tablet TAKE 1 TABLET(40 MG) BY MOUTH DAILY 10/01/18   Vicenta Maduro, FNP  guaiFENesin -codeine  100-10 MG/5ML syrup Take 5 mLs by mouth every 4 (four) hours as needed for cough. Patient not taking: Reported on 10/17/2023 09/14/23   Pasam, Chinita, MD  guaiFENesin -Codeine  200-10 MG/5ML LIQD Take 5 mLs by mouth 3 (three) times daily as needed (Cough). Patient not taking: Reported on 09/19/2023 09/14/23   Pasam, Chinita, MD  ibuprofen  (ADVIL ) 200 MG tablet Take 200 mg by mouth every 6 (six) hours as needed. Patient not taking: Reported on 10/17/2023    [provider]  irbesartan (AVAPRO) 300 MG tablet Take by mouth. 06/16/22   [provider]  lidocaine -diphenhydrAMINE -alum & mag hydroxide-simeth Take 5 mLs by mouth 3 (three) times daily as needed for mouth pain. Patient not taking: Reported on 09/19/2023 09/14/23   Pasam, Chinita, MD  loperamide  (IMODIUM ) 2 MG capsule  Take 1-2 capsules (2-4 mg total) by mouth as needed for diarrhea or loose stools. Patient not taking: Reported on 10/17/2023 08/10/23   Burton, Lacie K, NP  loratadine  (CLARITIN  REDITABS) 10 MG dissolvable tablet Take 1 tablet (10 mg total) by mouth daily. Patient not taking: Reported on 10/17/2023 08/10/23   Burton, Lacie K, NP  magic mouthwash (nystatin , hydrocortisone, diphenhydrAMINE , lidocaine ) suspension Swish and swallow 5 mLs 3 (three) times daily as needed for mouth pain. Patient not taking: Reported on 08/10/2023 08/04/23   Autumn Chinita, MD  magic mouthwash (lidocaine , diphenhydrAMINE , alum & mag hydroxide) suspension Swish and swallow 5 mLs by mouth 3 (three) times daily as needed for mouth pain. Patient not taking: Reported on 08/29/2023 08/17/23   Pasam, Chinita, MD  ondansetron  (ZOFRAN -ODT) 8 MG disintegrating tablet Take 1 tablet (8 mg total) by mouth every 8 (eight) hours as needed for nausea or vomiting. Patient not taking: Reported on 10/17/2023 08/03/23   Burton, Lacie K, NP  prochlorperazine  (COMPAZINE ) 10 MG tablet Take 1 tablet (10 mg total) by mouth every 6 (six) hours as needed for nausea or vomiting. Patient not taking: Reported on 10/17/2023 10/03/23   Pasam, Avinash, MD  spironolactone (ALDACTONE) 25 MG tablet Take by mouth. 06/16/22   [provider]  traMADol  (ULTRAM ) 50 MG tablet Take 1 tablet (50 mg total) by mouth every 6 (six) hours as needed. Patient not taking: Reported on 10/17/2023 07/06/23   Pasam, Chinita,  MD    Allergies: Paclitaxel , Docetaxel , Lisinopril , and Fosaprepitant     Review of Systems  All other systems reviewed and are negative.   Updated Vital Signs BP (!) 194/107   Pulse 98   Temp 98.7 F (37.1 C)   Resp 18   LMP 01/17/2022 (Approximate)   SpO2 95%   Physical Exam Vitals and nursing note reviewed.  Constitutional:      Appearance: Normal appearance.  Pulmonary:     Effort: Pulmonary effort is normal.  Musculoskeletal:     Cervical  back: Normal range of motion.  Skin:    Comments: There are 2 JP drains placed below the right axilla.  Both are sutured in place and are well-appearing.  There is no purulent drainage, no erythema of the skin, and no evidence of any complication.  I see no active drainage.  The drains appear to be functioning properly with serosanguineous fluid.  Neurological:     Mental Status: She is alert.     (all labs ordered are listed, but only abnormal results are displayed) Labs Reviewed - No data to display  EKG: None  Radiology: No results found.   Procedures   Medications Ordered in the ED - No data to display                                  Medical Decision Making  I am uncertain as to where the drainage came from, but the drain sites appear well and I do not feel as though any intervention is necessary.  Patient to be discharged to follow-up with her surgeon.     Final diagnoses:  None    ED Discharge Orders     None          Geroldine Berg, MD 01/24/24 [phone]

## 2024-01-24 NOTE — Discharge Instructions (Signed)
 Follow-up with your surgeon in the next 1 to 2 days, and return to the ER symptoms significantly worsen or change.

## 2024-01-25 ENCOUNTER — Other Ambulatory Visit: Payer: Self-pay

## 2024-02-27 ENCOUNTER — Encounter: Payer: Self-pay | Admitting: Oncology

## 2024-02-27 ENCOUNTER — Other Ambulatory Visit: Payer: Self-pay
